# Patient Record
Sex: Male | Born: 1935 | Race: White | Hispanic: No | Marital: Married | Smoking: Former smoker
Health system: Southern US, Community
[De-identification: ages and names within clinical notes are randomized; demographics above are authoritative.]

## PROBLEM LIST (undated history)

## (undated) DIAGNOSIS — I10 Essential (primary) hypertension: Secondary | ICD-10-CM

## (undated) DIAGNOSIS — N400 Enlarged prostate without lower urinary tract symptoms: Secondary | ICD-10-CM

## (undated) DIAGNOSIS — N184 Chronic kidney disease, stage 4 (severe): Secondary | ICD-10-CM

## (undated) DIAGNOSIS — I809 Phlebitis and thrombophlebitis of unspecified site: Secondary | ICD-10-CM

## (undated) DIAGNOSIS — I480 Paroxysmal atrial fibrillation: Secondary | ICD-10-CM

## (undated) DIAGNOSIS — M1990 Unspecified osteoarthritis, unspecified site: Secondary | ICD-10-CM

## (undated) DIAGNOSIS — Z8489 Family history of other specified conditions: Secondary | ICD-10-CM

## (undated) DIAGNOSIS — C801 Malignant (primary) neoplasm, unspecified: Secondary | ICD-10-CM

## (undated) HISTORY — DX: Chronic kidney disease, stage 4 (severe): N18.4

## (undated) HISTORY — PX: BACK SURGERY: SHX140

## (undated) HISTORY — PX: HERNIA REPAIR: SHX51

## (undated) HISTORY — PX: OTHER SURGICAL HISTORY: SHX169

## (undated) HISTORY — DX: Essential (primary) hypertension: I10

## (undated) HISTORY — PX: EYE SURGERY: SHX253

## (undated) HISTORY — DX: Paroxysmal atrial fibrillation: I48.0

## (undated) HISTORY — DX: Phlebitis and thrombophlebitis of unspecified site: I80.9

---

## 2002-09-06 ENCOUNTER — Encounter: Payer: Self-pay | Admitting: Neurological Surgery

## 2002-09-10 ENCOUNTER — Encounter: Payer: Self-pay | Admitting: Neurological Surgery

## 2002-09-10 ENCOUNTER — Inpatient Hospital Stay (HOSPITAL_COMMUNITY): Admission: RE | Admit: 2002-09-10 | Discharge: 2002-09-11 | Payer: Self-pay | Admitting: Neurological Surgery

## 2002-11-22 ENCOUNTER — Ambulatory Visit (HOSPITAL_COMMUNITY): Admission: RE | Admit: 2002-11-22 | Discharge: 2002-11-22 | Payer: Self-pay | Admitting: Neurological Surgery

## 2002-12-24 ENCOUNTER — Ambulatory Visit (HOSPITAL_COMMUNITY): Admission: RE | Admit: 2002-12-24 | Discharge: 2002-12-24 | Payer: Self-pay | Admitting: Neurological Surgery

## 2013-09-05 ENCOUNTER — Other Ambulatory Visit: Payer: Self-pay | Admitting: Internal Medicine

## 2013-09-05 DIAGNOSIS — M545 Low back pain: Secondary | ICD-10-CM

## 2013-09-15 ENCOUNTER — Other Ambulatory Visit: Payer: Self-pay

## 2016-02-09 ENCOUNTER — Other Ambulatory Visit: Payer: Self-pay | Admitting: Neurological Surgery

## 2016-02-17 ENCOUNTER — Other Ambulatory Visit: Payer: Self-pay | Admitting: Neurological Surgery

## 2016-02-19 ENCOUNTER — Encounter (HOSPITAL_COMMUNITY)
Admission: RE | Admit: 2016-02-19 | Discharge: 2016-02-19 | Disposition: A | Discharge status: Home / Self Care | Payer: Medicare Other | Source: Ambulatory Visit | Attending: Neurological Surgery | Admitting: Neurological Surgery

## 2016-02-19 ENCOUNTER — Encounter (HOSPITAL_COMMUNITY): Payer: Self-pay

## 2016-02-19 DIAGNOSIS — Z01812 Encounter for preprocedural laboratory examination: Secondary | ICD-10-CM | POA: Insufficient documentation

## 2016-02-19 DIAGNOSIS — Z79899 Other long term (current) drug therapy: Secondary | ICD-10-CM | POA: Diagnosis not present

## 2016-02-19 DIAGNOSIS — I452 Bifascicular block: Secondary | ICD-10-CM | POA: Insufficient documentation

## 2016-02-19 DIAGNOSIS — I10 Essential (primary) hypertension: Secondary | ICD-10-CM | POA: Insufficient documentation

## 2016-02-19 DIAGNOSIS — Z7982 Long term (current) use of aspirin: Secondary | ICD-10-CM | POA: Diagnosis not present

## 2016-02-19 DIAGNOSIS — M4806 Spinal stenosis, lumbar region: Secondary | ICD-10-CM | POA: Diagnosis not present

## 2016-02-19 DIAGNOSIS — R9431 Abnormal electrocardiogram [ECG] [EKG]: Secondary | ICD-10-CM | POA: Diagnosis not present

## 2016-02-19 DIAGNOSIS — Z87891 Personal history of nicotine dependence: Secondary | ICD-10-CM | POA: Insufficient documentation

## 2016-02-19 DIAGNOSIS — Z01818 Encounter for other preprocedural examination: Secondary | ICD-10-CM | POA: Insufficient documentation

## 2016-02-19 HISTORY — DX: Unspecified osteoarthritis, unspecified site: M19.90

## 2016-02-19 HISTORY — DX: Essential (primary) hypertension: I10

## 2016-02-19 HISTORY — DX: Benign prostatic hyperplasia without lower urinary tract symptoms: N40.0

## 2016-02-19 LAB — CBC
HEMATOCRIT: 41.3 % (ref 39.0–52.0)
Hemoglobin: 13.3 g/dL (ref 13.0–17.0)
MCH: 29.6 pg (ref 26.0–34.0)
MCHC: 32.2 g/dL (ref 30.0–36.0)
MCV: 91.8 fL (ref 78.0–100.0)
Platelets: 217 10*3/uL (ref 150–400)
RBC: 4.5 MIL/uL (ref 4.22–5.81)
RDW: 13.9 % (ref 11.5–15.5)
WBC: 7.8 10*3/uL (ref 4.0–10.5)

## 2016-02-19 LAB — SURGICAL PCR SCREEN
MRSA, PCR: NEGATIVE
Staphylococcus aureus: NEGATIVE

## 2016-02-19 LAB — BASIC METABOLIC PANEL
Anion gap: 8 (ref 5–15)
BUN: 31 mg/dL — ABNORMAL HIGH (ref 6–20)
CALCIUM: 9.1 mg/dL (ref 8.9–10.3)
CO2: 25 mmol/L (ref 22–32)
CREATININE: 1.56 mg/dL — AB (ref 0.61–1.24)
Chloride: 105 mmol/L (ref 101–111)
GFR calc Af Amer: 42 mL/min — ABNORMAL LOW (ref >60)
GFR calc non Af Amer: 37 mL/min — ABNORMAL LOW (ref >60)
GLUCOSE: 131 mg/dL — AB (ref 65–99)
Potassium: 4.8 mmol/L (ref 3.5–5.1)
Sodium: 138 mmol/L (ref 135–145)

## 2016-02-19 NOTE — Pre-Procedure Instructions (Signed)
    [facility] [place]  02/19/2016      [facility] - [place], [facility] [address] [place] [address] Phone: [phone] Fax: [phone]    Your procedure is scheduled on 02/27/16.  Report to [facility] Admitting at 830 A.M.  Call this number if you have problems the morning of surgery:  [phone]   Remember:  Do not eat food or drink liquids after midnight.  Take these medicines the morning of surgery with A SIP OF WATER --norvasc,ultram   Do not wear jewelry, make-up or nail polish.  Do not wear lotions, powders, or perfumes.  You may wear deodorant.  Do not shave 48 hours prior to surgery.  Men may shave face and neck.  Do not bring valuables to the hospital.  [facility] is not responsible for any belongings or valuables.  Contacts, dentures or bridgework may not be worn into surgery.  Leave your suitcase in the car.  After surgery it may be brought to your room.  For patients admitted to the hospital, discharge time will be determined by your treatment team.  Patients discharged the day of surgery will not be allowed to drive home.   Name and phone number of your driver:   Special instructions:   Please read over the following fact sheets that you were given. MRSA Information

## 2016-02-19 NOTE — Progress Notes (Signed)
I will contact Dr Manuella Ghazi in [place] for any cardiac studies. Pt states he has not had any ekgs,etc in years.

## 2016-02-20 NOTE — Progress Notes (Addendum)
Anesthesia Chart Review:  Pt is a 82 year old male scheduled for L2-3, L3-4, L4-5 lumbar laminectomy/ decompression microdiscectomy on 02/27/2016 with Dr. Ellene Route.   PCP is Dr. Manuella Ghazi at [facility].   PMH includes:  HTN. Former smoker. BMI 26  Medications include: amlodipine, ASA, lisinopril  Preoperative labs reviewed.  Cr 1.56, BUN 31  EKG 02/19/16: Atrial fibrillation (80 bpm) with premature ventricular or aberrantly conducted complexes. RBBB. LAFB. Bifascicular block   There is a prior EKG in our system from 2003 that shows NSR. I checked with Dr. Trena Platt office and pt doe not have a dx of afib in his history and EKG from 2010 also shows NSR.   Pt will need cardiac evaluation prior to surgery for new onset afib. Left voicemail for Janett Billow in Dr. Clarice Pole office.   Willeen Cass, FNP-BC [facility]/Anesthesiology Phone: [phone] 02/20/2016 1:59 PM  Addendum:   Echo 02/25/16: - Left ventricle: The cavity size was normal. Wall thickness was increased in a pattern of mild LVH. There was moderate focal basal hypertrophy of the septum. Systolic function was normal. The estimated ejection fraction was in the range of 55% to 60%. Wall motion was normal; there were no regional wall motion abnormalities. Features are consistent with a pseudonormal left ventricular filling pattern, with concomitant abnormal relaxation and increased filling pressure (grade 2 diastolic dysfunction). - Aortic valve: Mildly calcified annulus. Trileaflet. - Mitral valve: Calcified annulus. Mildly thickened leaflets. There was mild regurgitation. - Left atrium: The atrium was mildly dilated. - Right atrium: Central venous pressure (est): 3 mm Hg. - Atrial septum: No defect or patent foramen ovale was identified. - Tricuspid valve: There was trivial regurgitation. - Pulmonary arteries: PA peak pressure: 19 mm Hg (S). - Pericardium, extracardiac: There was no pericardial effusion. -  Impressions: Mild LVH with moderate basal septal hypertrophy and LVEF 55-60%. Grade 2 diastolic dysfunction with increased LV filling pressure. Mild left atrial enlargement. MAC with mildly thickened mitral leaflets and mild mitral regurgitation. Mildly calcified aortic annulus. Trivial tricuspid regurgitation with normal estimated PASP.  Pt saw Dr. Shelva Majestic with cardiology for new onset afib 02/25/16 and had an echo that same day. Metoprolol was started. By notes, EKG 02/25/16 showed NSR, occasional PVC and 1st degree AV block.  Dr. Claiborne Billings cleared pt for surgery noting "postoperative telemetry and an ECG should be obtained following his surgical procedure".   If no changes, I anticipate pt can proceed with surgery as scheduled.   Willeen Cass, FNP-BC [facility]/Anesthesiology Phone: [phone] 02/26/2016 1:05 PM

## 2016-02-23 NOTE — Progress Notes (Signed)
Spoke with Janett Billow at Dr Ellene Route.  Pt has cardiac clearance appointment with Dr Claiborne Billings on Wednesday 5/24

## 2016-02-25 ENCOUNTER — Ambulatory Visit (INDEPENDENT_AMBULATORY_CARE_PROVIDER_SITE_OTHER): Payer: Medicare Other | Admitting: Cardiovascular Disease

## 2016-02-25 ENCOUNTER — Ambulatory Visit (HOSPITAL_BASED_OUTPATIENT_CLINIC_OR_DEPARTMENT_OTHER)
Admission: RE | Admit: 2016-02-25 | Discharge: 2016-02-25 | Disposition: A | Discharge status: Home / Self Care | Payer: Medicare Other | Source: Ambulatory Visit | Attending: Cardiovascular Disease | Admitting: Cardiovascular Disease

## 2016-02-25 ENCOUNTER — Encounter: Payer: Self-pay | Admitting: Cardiovascular Disease

## 2016-02-25 VITALS — BP 130/68 | HR 75 | Ht 68.0 in | Wt 168.0 lb

## 2016-02-25 DIAGNOSIS — I10 Essential (primary) hypertension: Secondary | ICD-10-CM | POA: Diagnosis not present

## 2016-02-25 DIAGNOSIS — Z01810 Encounter for preprocedural cardiovascular examination: Secondary | ICD-10-CM | POA: Diagnosis not present

## 2016-02-25 DIAGNOSIS — Z01818 Encounter for other preprocedural examination: Secondary | ICD-10-CM | POA: Insufficient documentation

## 2016-02-25 DIAGNOSIS — I071 Rheumatic tricuspid insufficiency: Secondary | ICD-10-CM | POA: Insufficient documentation

## 2016-02-25 DIAGNOSIS — I440 Atrioventricular block, first degree: Secondary | ICD-10-CM

## 2016-02-25 DIAGNOSIS — I517 Cardiomegaly: Secondary | ICD-10-CM | POA: Insufficient documentation

## 2016-02-25 DIAGNOSIS — I480 Paroxysmal atrial fibrillation: Secondary | ICD-10-CM | POA: Diagnosis not present

## 2016-02-25 DIAGNOSIS — I340 Nonrheumatic mitral (valve) insufficiency: Secondary | ICD-10-CM

## 2016-02-25 DIAGNOSIS — I493 Ventricular premature depolarization: Secondary | ICD-10-CM

## 2016-02-25 LAB — ECHOCARDIOGRAM COMPLETE
HEIGHTINCHES: 68 in
Weight: 2688 oz

## 2016-02-25 MED ORDER — METOPROLOL TARTRATE 25 MG PO TABS: 12.5000 mg | ORAL_TABLET | Freq: Two times a day (BID) | ORAL | Status: DC

## 2016-02-25 NOTE — Progress Notes (Signed)
Patient ID: Nathan Kim, male   DOB: 24-Feb-1920, 82 y.o.   MRN: [id number]     Primary Nathan Kim: Dr. [name] Referring M.D.: Dr. Kristeen Miss  PATIENT PROFILE: Nathan Kim is a 82 y.o. male who is scheduled to undergo lumbar laminectomy on 02/27/2016.  He presents for preoperative evaluation and clearance after he was found to have atrial fibrillation on his preoperative ECG.   HPI:  Nathan Kim is a healthy 82 year old gentleman who does not have any surgical history and only over the last several years, was started on medication for high blood pressure.  He now is on lisinopril 20 mg and amlodipine 2.5 mg daily.  He also has been on aspirin 81 mg.  He has developed progressive lower back discomfort and has undergone evaluation by Dr. Ellene Route.  He is had recurrent pain despite epidural steroid injections.  He is scheduled to undergo L2-L3, L3-L4, and L4-L5 laminectomy in 2 days.  On 02/19/2016 he underwent preoperative screening.  An ECG done on that day showed atrial fibrillation with a PVC and left anterior hemiblock.  The patient was entirely asymptomatic and was unaware of any rhythm disturbance.  He denies any recent shortness of breath or chest pain. He presents to the office today for cardiology evaluation. He is here with his daughter.  Past Medical History  Diagnosis Date  . Hypertension   . Arthritis   . BPH (benign prostatic hyperplasia)     Past Surgical History  Procedure Laterality Date  . Carpel tunnel    . Eye surgery    . Back surgery    . Hernia repair      No Known Allergies  Current Outpatient Prescriptions  Medication Sig Dispense Refill  . amLODipine (NORVASC) 2.5 MG tablet Take 1 tablet by mouth daily.  4  . aspirin 81 MG tablet Take 81 mg by mouth daily.    Marland Kitchen lisinopril (PRINIVIL,ZESTRIL) 20 MG tablet Take 1 tablet by mouth daily.  3  . traMADol (ULTRAM) 50 MG tablet Take 1 tablet by mouth every 6 (six) hours as needed.  2  . metoprolol tartrate  (LOPRESSOR) 25 MG tablet Take 0.5 tablets (12.5 mg total) by mouth 2 (two) times daily. 60 tablet 3   No current facility-administered medications for this visit.    Social History   Social History  . Marital Status: Married    Spouse Name: N/A  . Number of Children: N/A  . Years of Education: N/A   Occupational History  . Not on file.   Social History Main Topics  . Smoking status: Former Research scientist (life sciences)  . Smokeless tobacco: Not on file  . Alcohol Use: No  . Drug Use: No  . Sexual Activity: Not on file   Other Topics Concern  . Not on file   Social History Narrative   Additional social history is notable that he is married for 65 years.  He lives with his wife.  He has 3 children, 4 grandchildren 3 great-grandchildren.  He is retired and previously had worked at [employer] in [place].  He completed 12 grade of education.  He is a previous smoker and smoked for ~ 50 years.  He quit 30 years ago.  He does exercise regularly at least 3 days per week.  This has been limited by his back discomfort.  Family history is notable in that his mother died at age 6 and his father died at age 57.  One brother died at age 76 with cancer.  Another brother is living.  One sister died at 19 with a stroke.  2 sisters are living.  One child died with cancer.  ROS General: Negative; No fevers, chills, or night sweats HEENT: Negative; No changes in vision or hearing, sinus congestion, difficulty swallowing Pulmonary: Negative; No cough, wheezing, shortness of breath, hemoptysis Cardiovascular:  See HPI;  GI: Negative; No nausea, vomiting, diarrhea, or abdominal pain GU: Negative; No dysuria, hematuria, or difficulty voiding Musculoskeletal: Positive for significant lumbar back pain Hematologic/Oncologic: Negative; no easy bruising, bleeding Endocrine: Negative; no heat/cold intolerance; no diabetes Neuro: Negative; no changes in balance, headaches Skin: Negative; No rashes or skin  lesions Psychiatric: Negative; No behavioral problems, depression Sleep: Negative; No daytime sleepiness, hypersomnolence, bruxism, restless legs, hypnogagnic hallucinations Other comprehensive 14 point system review is negative   Physical Exam BP 130/68 mmHg  Pulse 75  Ht 5\' 8"  (1.727 m)  Wt 168 lb (76.204 kg)  BMI 25.55 kg/m2  Wt Readings from Last 3 Encounters:  02/25/16 168 lb (76.204 kg)  02/19/16 169 lb 1.5 oz (76.7 kg)   General: Alert, oriented, no distress. A young appearing 82 year old. Skin: normal turgor, no rashes, warm and dry HEENT: Normocephalic, atraumatic. Pupils equal round and reactive to light; sclera anicteric; extraocular muscles intact; Fundi without hemorrhages or exudates.  Disks flat Nose without nasal septal hypertrophy Mouth/Parynx benign; Mallinpatti scale Neck: No JVD, no carotid bruits; normal carotid upstroke Lungs: clear to ausculatation and percussion; no wheezing or rales Chest wall: without tenderness to palpitation Heart: PMI not displaced, RRR with an occasional ectopic complex, s1 s2 normal, 1/6 systolic murmur, no diastolic murmur, no rubs, gallops, thrills, or heaves Abdomen: soft, nontender; no hepatosplenomehaly, BS+; abdominal aorta nontender and not dilated by palpation. Back: no CVA tenderness Pulses 2+ Musculoskeletal: full range of motion, normal strength, no joint deformities Extremities: no clubbing cyanosis or edema, Homan's sign negative  Neurologic: grossly nonfocal; Cranial nerves grossly wnl Psychologic: Normal mood and affect   ECG (independently read by me): Normal sinus rhythm at 75 bpm with sinus arrhythmia.  An occasional unifocal PVC with VA conduction.  First-degree AV block with a PR interval of 212 ms,  LABS:  BMP Latest Ref Rng 02/19/2016  Glucose 65 - 99 mg/dL 131(H)  BUN 6 - 20 mg/dL 31(H)  Creatinine 0.61 - 1.24 mg/dL 1.56(H)  Sodium 135 - 145 mmol/L 138  Potassium 3.5 - 5.1 mmol/L 4.8  Chloride 101 - 111  mmol/L 105  CO2 22 - 32 mmol/L 25  Calcium 8.9 - 10.3 mg/dL 9.1     No flowsheet data found.  CBC Latest Ref Rng 02/19/2016  WBC 4.0 - 10.5 K/uL 7.8  Hemoglobin 13.0 - 17.0 g/dL 13.3  Hematocrit 39.0 - 52.0 % 41.3  Platelets 150 - 400 K/uL 217   Lab Results  Component Value Date   MCV 91.8 02/19/2016   No results found for: TSH No results found for: HGBA1C   BNP No results found for: BNP  ProBNP No results found for: PROBNP   Lipid Panel  No results found for: CHOL, TRIG, HDL, CHOLHDL, VLDL, LDLCALC, LDLDIRECT  RADIOLOGY: No results found.  I scheduled the patient for an echo to be done as soon as possible today following his office visit: ------------------------------------------------------------------- 02/25/2016 ECHO Study Conclusions  - Left ventricle: The cavity size was normal. Wall thickness was  increased in a pattern of mild LVH. There was moderate focal  basal hypertrophy of the septum. Systolic function was normal.  The estimated ejection fraction was in the range of 55% to 60%.  Wall motion was normal; there were no regional wall motion  abnormalities. Features are consistent with a pseudonormal left  ventricular filling pattern, with concomitant abnormal relaxation  and increased filling pressure (grade 2 diastolic dysfunction). - Aortic valve: Mildly calcified annulus. Trileaflet. - Mitral valve: Calcified annulus. Mildly thickened leaflets .  There was mild regurgitation. - Left atrium: The atrium was mildly dilated. - Right atrium: Central venous pressure (est): 3 mm Hg. - Atrial septum: No defect or patent foramen ovale was identified. - Tricuspid valve: There was trivial regurgitation. - Pulmonary arteries: PA peak pressure: 19 mm Hg (S). - Pericardium, extracardiac: There was no pericardial effusion.  Impressions:  - Mild LVH with moderate basal septal hypertrophy and LVEF 55-60%.  Grade 2 diastolic dysfunction with increased  LV filling pressure.  Mild left atrial enlargement. MAC with mildly thickened mitral  leaflets and mild mitral regurgitation. Mildly calcified aortic  annulus. Trivial tricuspid regurgitation with normal estimated  PASP.   ASSESSMENT AND PLAN: Mr. Damondre Childree is a young appearing 82 year old male who is been amazingly healthy for most of his life and has never undergone any surgical procedure.  He has several year history of hypertension, and most recently has been on lisinopril 20 mg daily in addition to low-dose amlodipine at 2.5 mg.  He is unaware of any arrhythmia but was recently found to be in atrial fibrillation on his preoperative ECG from 02/19/2016.  His ECG today in the office shows sinus rhythm with mild sinus arrhythmia and an occasional PVC.  I scheduled him for a stat echo which was done today after his office visit.  As described above, this confirms normal systolic function with an EF of 55-60% without wall motion abnormalities.  There was mitral annular calcification and a mildly calcified aortic valve annulus.  He had normal pulmonary pressures.  I have started him on metoprolol, tartrate 12.5 mg twice a day, which should be helpful to prevent recurrent atrial fibrillation episodes.  With his echo showing normal LV function, wall motion abnormalities and the fact that he is back in sinus rhythm today  I will give him clearance for his planned laminectomy procedure in 2 days.  The patient will have taken 4 doses of metoprolol at low-dose.  Prior to Friday morning which should hopefully allow stability of his cardiac rhythm.  Ultimately, he may be a candidate for switching his amlodipine to Cardizem.  We will need to decide potential long-term anticoagulation versus aspirin therapy, but with his upcoming surgery this will not be instituted presently.  Postoperative telemetry and an ECG should be obtained following his surgical procedure.  I will see him in the office in 3-4 weeks  for follow-up evaluation or sooner if problems arise.   Nathan Sine, Nathan Kim, [facility] 02/25/2016 9:57 PM

## 2016-02-25 NOTE — Patient Instructions (Signed)
Your physician has requested that you have an echocardiogram. Echocardiography is a painless test that uses sound waves to create images of your heart. It provides your doctor with information about the size and shape of your heart and how well your heart's chambers and valves are working. This procedure takes approximately one hour. There are no restrictions for this procedure. This will be done today.  Your physician has recommended you make the following change in your medication: start new prescription for lopressor.  Your physician recommends that you schedule a follow-up appointment in: PENDING ECHO RESULTS.

## 2016-02-26 MED ORDER — CEFAZOLIN SODIUM-DEXTROSE 2-4 GM/100ML-% IV SOLN
2.0000 g | INTRAVENOUS | Status: AC
  Administered 2016-02-27: 2 g via INTRAVENOUS
  Filled 2016-02-26: qty 100

## 2016-02-27 ENCOUNTER — Inpatient Hospital Stay (HOSPITAL_COMMUNITY): Payer: Medicare Other | Admitting: Anesthesiology

## 2016-02-27 ENCOUNTER — Encounter (HOSPITAL_COMMUNITY)
Admission: RE | Disposition: A | Discharge status: Home Health | Payer: Self-pay | Source: Ambulatory Visit | Attending: Neurological Surgery

## 2016-02-27 ENCOUNTER — Inpatient Hospital Stay (HOSPITAL_COMMUNITY)
Admission: RE | Admit: 2016-02-27 | Discharge: 2016-02-28 | DRG: 517 | Disposition: A | Discharge status: Home Health | Payer: Medicare Other | Source: Ambulatory Visit | Attending: Neurological Surgery | Admitting: Neurological Surgery

## 2016-02-27 ENCOUNTER — Inpatient Hospital Stay (HOSPITAL_COMMUNITY): Payer: Medicare Other | Admitting: Emergency Medicine

## 2016-02-27 ENCOUNTER — Inpatient Hospital Stay (HOSPITAL_COMMUNITY): Payer: Medicare Other

## 2016-02-27 ENCOUNTER — Encounter (HOSPITAL_COMMUNITY): Payer: Self-pay | Admitting: *Deleted

## 2016-02-27 DIAGNOSIS — Z419 Encounter for procedure for purposes other than remedying health state, unspecified: Secondary | ICD-10-CM

## 2016-02-27 DIAGNOSIS — R338 Other retention of urine: Secondary | ICD-10-CM | POA: Diagnosis present

## 2016-02-27 DIAGNOSIS — M4806 Spinal stenosis, lumbar region: Secondary | ICD-10-CM | POA: Diagnosis present

## 2016-02-27 DIAGNOSIS — M4726 Other spondylosis with radiculopathy, lumbar region: Secondary | ICD-10-CM | POA: Diagnosis present

## 2016-02-27 DIAGNOSIS — N401 Enlarged prostate with lower urinary tract symptoms: Secondary | ICD-10-CM | POA: Diagnosis present

## 2016-02-27 DIAGNOSIS — M545 Low back pain: Secondary | ICD-10-CM | POA: Diagnosis present

## 2016-02-27 DIAGNOSIS — I10 Essential (primary) hypertension: Secondary | ICD-10-CM | POA: Diagnosis present

## 2016-02-27 DIAGNOSIS — Z87891 Personal history of nicotine dependence: Secondary | ICD-10-CM | POA: Diagnosis not present

## 2016-02-27 DIAGNOSIS — Z79899 Other long term (current) drug therapy: Secondary | ICD-10-CM | POA: Diagnosis not present

## 2016-02-27 DIAGNOSIS — Z7982 Long term (current) use of aspirin: Secondary | ICD-10-CM | POA: Diagnosis not present

## 2016-02-27 DIAGNOSIS — M48062 Spinal stenosis, lumbar region with neurogenic claudication: Secondary | ICD-10-CM | POA: Diagnosis present

## 2016-02-27 HISTORY — PX: LUMBAR LAMINECTOMY/DECOMPRESSION MICRODISCECTOMY: SHX5026

## 2016-02-27 SURGERY — LUMBAR LAMINECTOMY/DECOMPRESSION MICRODISCECTOMY 3 LEVELS
Anesthesia: General | Site: Back

## 2016-02-27 MED ORDER — NEOSTIGMINE METHYLSULFATE 10 MG/10ML IV SOLN
INTRAVENOUS | Status: DC | PRN
  Administered 2016-02-27: 3 mg via INTRAVENOUS

## 2016-02-27 MED ORDER — SENNA 8.6 MG PO TABS
1.0000 | ORAL_TABLET | Freq: Two times a day (BID) | ORAL | Status: DC
  Administered 2016-02-27 – 2016-02-28 (×2): 8.6 mg via ORAL
  Filled 2016-02-27 (×2): qty 1

## 2016-02-27 MED ORDER — GLYCOPYRROLATE 0.2 MG/ML IJ SOLN
INTRAMUSCULAR | Status: DC | PRN
  Administered 2016-02-27: 0.4 mg via INTRAVENOUS

## 2016-02-27 MED ORDER — SODIUM CHLORIDE 0.9 % IV SOLN
INTRAVENOUS | Status: DC
  Administered 2016-02-27 – 2016-02-28 (×2): via INTRAVENOUS

## 2016-02-27 MED ORDER — 0.9 % SODIUM CHLORIDE (POUR BTL) OPTIME
TOPICAL | Status: DC | PRN
  Administered 2016-02-27: 1000 mL

## 2016-02-27 MED ORDER — MENTHOL 3 MG MT LOZG: 1.0000 | LOZENGE | OROMUCOSAL | Status: DC | PRN

## 2016-02-27 MED ORDER — DEXAMETHASONE SODIUM PHOSPHATE 10 MG/ML IJ SOLN
INTRAMUSCULAR | Status: DC | PRN
  Administered 2016-02-27: 10 mg via INTRAVENOUS

## 2016-02-27 MED ORDER — THROMBIN 5000 UNITS EX SOLR
CUTANEOUS | Status: DC | PRN
  Administered 2016-02-27 (×2): 5000 [IU] via TOPICAL

## 2016-02-27 MED ORDER — MORPHINE SULFATE (PF) 2 MG/ML IV SOLN: 1.0000 mg | INTRAVENOUS | Status: DC | PRN

## 2016-02-27 MED ORDER — LACTATED RINGERS IV SOLN
INTRAVENOUS | Status: DC
  Administered 2016-02-27 (×3): via INTRAVENOUS

## 2016-02-27 MED ORDER — SODIUM CHLORIDE 0.9% FLUSH: 3.0000 mL | INTRAVENOUS | Status: DC | PRN

## 2016-02-27 MED ORDER — HYDROCODONE-ACETAMINOPHEN 5-325 MG PO TABS
1.0000 | ORAL_TABLET | ORAL | Status: DC | PRN
  Administered 2016-02-28: 1 via ORAL
  Filled 2016-02-27: qty 1

## 2016-02-27 MED ORDER — DEXTROSE 5 % IV SOLN
500.0000 mg | Freq: Four times a day (QID) | INTRAVENOUS | Status: DC | PRN
  Filled 2016-02-27: qty 5

## 2016-02-27 MED ORDER — THROMBIN 5000 UNITS EX SOLR
OROMUCOSAL | Status: DC | PRN
  Administered 2016-02-27: 14:00:00 via TOPICAL

## 2016-02-27 MED ORDER — FENTANYL CITRATE (PF) 100 MCG/2ML IJ SOLN
INTRAMUSCULAR | Status: DC | PRN
  Administered 2016-02-27 (×2): 50 ug via INTRAVENOUS

## 2016-02-27 MED ORDER — ALUM & MAG HYDROXIDE-SIMETH 200-200-20 MG/5ML PO SUSP: 30.0000 mL | Freq: Four times a day (QID) | ORAL | Status: DC | PRN

## 2016-02-27 MED ORDER — AMLODIPINE BESYLATE 2.5 MG PO TABS
2.5000 mg | ORAL_TABLET | Freq: Every day | ORAL | Status: DC
  Filled 2016-02-27: qty 1

## 2016-02-27 MED ORDER — PROPOFOL 10 MG/ML IV BOLUS
INTRAVENOUS | Status: DC | PRN
  Administered 2016-02-27: 100 mg via INTRAVENOUS

## 2016-02-27 MED ORDER — LIDOCAINE HCL (CARDIAC) 20 MG/ML IV SOLN
INTRAVENOUS | Status: DC | PRN
  Administered 2016-02-27: 40 mg via INTRAVENOUS

## 2016-02-27 MED ORDER — DOCUSATE SODIUM 100 MG PO CAPS
100.0000 mg | ORAL_CAPSULE | Freq: Two times a day (BID) | ORAL | Status: DC
  Administered 2016-02-27 – 2016-02-28 (×2): 100 mg via ORAL
  Filled 2016-02-27 (×2): qty 1

## 2016-02-27 MED ORDER — BUPIVACAINE HCL (PF) 0.5 % IJ SOLN
INTRAMUSCULAR | Status: DC | PRN
  Administered 2016-02-27: 20 mL
  Administered 2016-02-27: 5 mL

## 2016-02-27 MED ORDER — PHENYLEPHRINE HCL 10 MG/ML IJ SOLN
10.0000 mg | INTRAMUSCULAR | Status: DC | PRN
  Administered 2016-02-27: 30 ug/min via INTRAVENOUS

## 2016-02-27 MED ORDER — POLYETHYLENE GLYCOL 3350 17 G PO PACK: 17.0000 g | PACK | Freq: Every day | ORAL | Status: DC | PRN

## 2016-02-27 MED ORDER — ACETAMINOPHEN 325 MG PO TABS: 650.0000 mg | ORAL_TABLET | ORAL | Status: DC | PRN

## 2016-02-27 MED ORDER — SODIUM CHLORIDE 0.9% FLUSH: 3.0000 mL | Freq: Two times a day (BID) | INTRAVENOUS | Status: DC

## 2016-02-27 MED ORDER — PROPOFOL 10 MG/ML IV BOLUS
INTRAVENOUS | Status: AC
  Filled 2016-02-27: qty 20

## 2016-02-27 MED ORDER — FENTANYL CITRATE (PF) 250 MCG/5ML IJ SOLN
INTRAMUSCULAR | Status: AC
  Filled 2016-02-27: qty 5

## 2016-02-27 MED ORDER — ONDANSETRON HCL 4 MG/2ML IJ SOLN
INTRAMUSCULAR | Status: DC | PRN
  Administered 2016-02-27: 4 mg via INTRAVENOUS

## 2016-02-27 MED ORDER — LIDOCAINE-EPINEPHRINE 1 %-1:100000 IJ SOLN
INTRAMUSCULAR | Status: DC | PRN
  Administered 2016-02-27: 5 mL

## 2016-02-27 MED ORDER — TRAMADOL HCL 50 MG PO TABS: 50.0000 mg | ORAL_TABLET | Freq: Four times a day (QID) | ORAL | Status: DC | PRN

## 2016-02-27 MED ORDER — METOPROLOL TARTRATE 25 MG PO TABS
12.5000 mg | ORAL_TABLET | Freq: Two times a day (BID) | ORAL | Status: DC
  Administered 2016-02-27 – 2016-02-28 (×2): 12.5 mg via ORAL
  Filled 2016-02-27 (×2): qty 1

## 2016-02-27 MED ORDER — HEMOSTATIC AGENTS (NO CHARGE) OPTIME
TOPICAL | Status: DC | PRN
  Administered 2016-02-27: 1 via TOPICAL

## 2016-02-27 MED ORDER — ONDANSETRON HCL 4 MG/2ML IJ SOLN
4.0000 mg | INTRAMUSCULAR | Status: DC | PRN
  Administered 2016-02-27 – 2016-02-28 (×4): 4 mg via INTRAVENOUS
  Filled 2016-02-27 (×4): qty 2

## 2016-02-27 MED ORDER — BACITRACIN 50000 UNITS IM SOLR
INTRAMUSCULAR | Status: DC | PRN
  Administered 2016-02-27: 14:00:00

## 2016-02-27 MED ORDER — BISACODYL 10 MG RE SUPP: 10.0000 mg | Freq: Every day | RECTAL | Status: DC | PRN

## 2016-02-27 MED ORDER — METHOCARBAMOL 500 MG PO TABS: 500.0000 mg | ORAL_TABLET | Freq: Four times a day (QID) | ORAL | Status: DC | PRN

## 2016-02-27 MED ORDER — DEXAMETHASONE 2 MG PO TABS
2.0000 mg | ORAL_TABLET | Freq: Two times a day (BID) | ORAL | Status: DC
  Administered 2016-02-27 – 2016-02-28 (×2): 2 mg via ORAL
  Filled 2016-02-27 (×2): qty 1

## 2016-02-27 MED ORDER — ACETAMINOPHEN 650 MG RE SUPP: 650.0000 mg | RECTAL | Status: DC | PRN

## 2016-02-27 MED ORDER — LISINOPRIL 20 MG PO TABS
20.0000 mg | ORAL_TABLET | Freq: Every day | ORAL | Status: DC
  Filled 2016-02-27: qty 1

## 2016-02-27 MED ORDER — ROCURONIUM BROMIDE 100 MG/10ML IV SOLN
INTRAVENOUS | Status: DC | PRN
  Administered 2016-02-27: 50 mg via INTRAVENOUS

## 2016-02-27 MED ORDER — FLEET ENEMA 7-19 GM/118ML RE ENEM: 1.0000 | ENEMA | Freq: Once | RECTAL | Status: DC | PRN

## 2016-02-27 MED ORDER — EPHEDRINE SULFATE 50 MG/ML IJ SOLN
INTRAMUSCULAR | Status: DC | PRN
  Administered 2016-02-27 (×2): 10 mg via INTRAVENOUS

## 2016-02-27 MED ORDER — PHENOL 1.4 % MT LIQD: 1.0000 | OROMUCOSAL | Status: DC | PRN

## 2016-02-27 MED ORDER — SODIUM CHLORIDE 0.9 % IV SOLN: 250.0000 mL | INTRAVENOUS | Status: DC

## 2016-02-27 SURGICAL SUPPLY — 51 items
ADH SKN CLS APL DERMABOND .7 (GAUZE/BANDAGES/DRESSINGS) ×1
BAG DECANTER FOR FLEXI CONT (MISCELLANEOUS) ×3 IMPLANT
BLADE CLIPPER SURG (BLADE) IMPLANT
BUR ACORN 6.0 (BURR) IMPLANT
BUR ACORN 6.0MM (BURR)
BUR MATCHSTICK NEURO 3.0 LAGG (BURR) ×3 IMPLANT
CANISTER SUCT 3000ML PPV (MISCELLANEOUS) ×3 IMPLANT
DECANTER SPIKE VIAL GLASS SM (MISCELLANEOUS) ×3 IMPLANT
DERMABOND ADVANCED (GAUZE/BANDAGES/DRESSINGS) ×2
DERMABOND ADVANCED .7 DNX12 (GAUZE/BANDAGES/DRESSINGS) ×1 IMPLANT
DEVICE DISSECT PLASMABLAD 3.0S (MISCELLANEOUS) IMPLANT
DRAPE LAPAROTOMY 100X72X124 (DRAPES) ×3 IMPLANT
DRAPE MICROSCOPE LEICA (MISCELLANEOUS) ×2 IMPLANT
DRAPE POUCH INSTRU U-SHP 10X18 (DRAPES) ×3 IMPLANT
DRAPE PROXIMA HALF (DRAPES) IMPLANT
DURAPREP 26ML APPLICATOR (WOUND CARE) ×3 IMPLANT
ELECT REM PT RETURN 9FT ADLT (ELECTROSURGICAL) ×3
ELECTRODE REM PT RTRN 9FT ADLT (ELECTROSURGICAL) ×1 IMPLANT
GAUZE SPONGE 4X4 12PLY STRL (GAUZE/BANDAGES/DRESSINGS) ×3 IMPLANT
GAUZE SPONGE 4X4 16PLY XRAY LF (GAUZE/BANDAGES/DRESSINGS) IMPLANT
GLOVE BIOGEL PI IND STRL 7.5 (GLOVE) IMPLANT
GLOVE BIOGEL PI IND STRL 8.5 (GLOVE) ×1 IMPLANT
GLOVE BIOGEL PI INDICATOR 7.5 (GLOVE) ×2
GLOVE BIOGEL PI INDICATOR 8.5 (GLOVE) ×2
GLOVE ECLIPSE 7.0 STRL STRAW (GLOVE) ×2 IMPLANT
GLOVE ECLIPSE 8.5 STRL (GLOVE) ×3 IMPLANT
GOWN STRL REUS W/ TWL LRG LVL3 (GOWN DISPOSABLE) IMPLANT
GOWN STRL REUS W/ TWL XL LVL3 (GOWN DISPOSABLE) IMPLANT
GOWN STRL REUS W/TWL 2XL LVL3 (GOWN DISPOSABLE) ×3 IMPLANT
GOWN STRL REUS W/TWL LRG LVL3 (GOWN DISPOSABLE) ×6
GOWN STRL REUS W/TWL XL LVL3 (GOWN DISPOSABLE)
HEMOSTAT POWDER SURGIFOAM 1G (HEMOSTASIS) ×2 IMPLANT
KIT BASIN OR (CUSTOM PROCEDURE TRAY) ×3 IMPLANT
KIT ROOM TURNOVER OR (KITS) ×3 IMPLANT
NDL SPNL 20GX3.5 QUINCKE YW (NEEDLE) IMPLANT
NEEDLE HYPO 22GX1.5 SAFETY (NEEDLE) ×3 IMPLANT
NEEDLE SPNL 20GX3.5 QUINCKE YW (NEEDLE) ×3 IMPLANT
NS IRRIG 1000ML POUR BTL (IV SOLUTION) ×3 IMPLANT
PACK LAMINECTOMY NEURO (CUSTOM PROCEDURE TRAY) ×3 IMPLANT
PAD ARMBOARD 7.5X6 YLW CONV (MISCELLANEOUS) ×9 IMPLANT
PATTIES SURGICAL .5 X1 (DISPOSABLE) ×5 IMPLANT
PLASMABLADE 3.0S (MISCELLANEOUS) ×3
RUBBERBAND STERILE (MISCELLANEOUS) ×4 IMPLANT
SPONGE SURGIFOAM ABS GEL SZ50 (HEMOSTASIS) ×3 IMPLANT
SUT VIC AB 1 CT1 18XBRD ANBCTR (SUTURE) ×1 IMPLANT
SUT VIC AB 1 CT1 8-18 (SUTURE) ×3
SUT VIC AB 2-0 CP2 18 (SUTURE) ×3 IMPLANT
SUT VIC AB 3-0 SH 8-18 (SUTURE) ×3 IMPLANT
TOWEL OR 17X24 6PK STRL BLUE (TOWEL DISPOSABLE) ×3 IMPLANT
TOWEL OR 17X26 10 PK STRL BLUE (TOWEL DISPOSABLE) ×3 IMPLANT
WATER STERILE IRR 1000ML POUR (IV SOLUTION) ×3 IMPLANT

## 2016-02-27 NOTE — Transfer of Care (Signed)
Immediate Anesthesia Transfer of Care Note  Patient: Nathan Kim  Procedure(s) Performed: Procedure(s) with comments: LUMBAR LAMINECTOMY/DECOMPRESSION MICRODISCECTOMY 3 LEVELS (N/A) - L2-3 L3-4 L4-5 Laminectomy  Patient Location: NICU  Anesthesia Type:General  Level of Consciousness: awake, alert  and patient cooperative  Airway & Oxygen Therapy: Patient Spontanous Breathing and Patient connected to face mask oxygen  Post-op Assessment: Post vital signs: Reviewed and stable  Last Vitals:  Filed Vitals:   02/27/16 0933 02/27/16 1545  BP: 140/71 159/95  Pulse: 62 68  Temp: 36.8 C 36.2 C  Resp: 18 22    Last Pain: There were no vitals filed for this visit.    Patients Stated Pain Goal: 3 ([id number])  Complications: No apparent anesthesia complications

## 2016-02-27 NOTE — H&P (Signed)
Nathan Kim is an 82 y.o. male.   Chief Complaint: Back pain bilateral leg pain with inability to walk but short distances HPI: The patient is an 82 year old individual whose had advanced spondylosis of lumbar spine with severe stenosis at L2-3 3445 and treated with epidural steroid injections for a number of years and these of cervical well however lately he notes that he's been having increasing fatigability of his legs generalized weakness and inability to walk even short distances he is interested in some definitive intervention and I noted that bilateral laminotomies and foraminotomies should give him relief of the worst of his stenotic symptoms and hopefully allow him to walk a bit more comfortably. He is now admitted for the surgery  Past Medical History  Diagnosis Date  . Hypertension   . Arthritis   . BPH (benign prostatic hyperplasia)     Past Surgical History  Procedure Laterality Date  . Carpel tunnel    . Eye surgery    . Back surgery    . Hernia repair      History reviewed. No pertinent family history. Social History:  reports that he has quit smoking. He does not have any smokeless tobacco history on file. He reports that he does not drink alcohol or use illicit drugs.  Allergies: No Known Allergies  Medications Prior to Admission  Medication Sig Dispense Refill  . amLODipine (NORVASC) 2.5 MG tablet Take 1 tablet by mouth daily.  4  . aspirin 81 MG tablet Take 81 mg by mouth daily.    Marland Kitchen lisinopril (PRINIVIL,ZESTRIL) 20 MG tablet Take 1 tablet by mouth daily.  3  . metoprolol tartrate (LOPRESSOR) 25 MG tablet Take 0.5 tablets (12.5 mg total) by mouth 2 (two) times daily. 60 tablet 3  . traMADol (ULTRAM) 50 MG tablet Take 1 tablet by mouth every 6 (six) hours as needed.  2    No results found for this or any previous visit (from the past 48 hour(s)). No results found.  Review of Systems  HENT: Negative.   Eyes: Negative.   Respiratory: Negative.    Cardiovascular: Negative.   Gastrointestinal: Negative.   Musculoskeletal: Positive for back pain.  Skin: Negative.   Neurological: Positive for focal weakness and weakness.       Weakness in legs and walking significant chronic back pain  Endo/Heme/Allergies: Negative.   Psychiatric/Behavioral: Negative.     Blood pressure 140/71, pulse 62, temperature 98.2 F (36.8 C), temperature source Oral, resp. rate 18, height 5\' 8"  (1.727 m), weight 76.204 kg (168 lb), SpO2 99 %. Physical Exam  Constitutional: He appears well-developed and well-nourished.  HENT:  Head: Normocephalic and atraumatic.  Eyes: Conjunctivae and EOM are normal. Pupils are equal, round, and reactive to light.  Neck: Normal range of motion. Neck supple.  Cardiovascular: Normal rate, regular rhythm and normal heart sounds.   Respiratory: Effort normal and breath sounds normal.  GI: Soft.  Musculoskeletal:  Positive straight leg raising at 45 in either lower extremity  Neurological:  Good strength to confrontational testing and iliopsoas quadriceps tibialis anterior and gastrocs. Absent deep tendon reflexes in patella and Achilles. Upper extremity reflexes trace in biceps absent in the triceps. Cranial nerve examination is normal. Station and gait are intact.  Skin: Skin is warm and dry.  Psychiatric: He has a normal mood and affect. His behavior is normal. Judgment and thought content normal.     Assessment/Plan Lumbar spinal stenosis L2-3 L3-4 L4-5.  Decompression L2-3 L3-4 L4-5 with bilateral  laminotomies and foraminotomies.  Earleen Newport, MD 02/27/2016, 12:32 PM

## 2016-02-27 NOTE — Anesthesia Postprocedure Evaluation (Signed)
Anesthesia Post Note  Patient: Nathan Kim  Procedure(s) Performed: Procedure(s) (LRB): LUMBAR LAMINECTOMY/DECOMPRESSION MICRODISCECTOMY 3 LEVELS (N/A)  Patient location during evaluation: PACU Anesthesia Type: General Level of consciousness: awake Pain management: pain level controlled Respiratory status: spontaneous breathing Cardiovascular status: stable Postop Assessment: no signs of nausea or vomiting Anesthetic complications: no    Last Vitals:  Filed Vitals:   02/27/16 0933 02/27/16 1545  BP: 140/71 159/95  Pulse: 62 68  Temp: 36.8 C 36.2 C  Resp: 18 22    Last Pain: There were no vitals filed for this visit.               Deovion Batrez

## 2016-02-27 NOTE — Anesthesia Procedure Notes (Signed)
Procedure Name: Intubation Date/Time: 02/27/2016 1:20 PM Performed by: Lance Coon Pre-anesthesia Checklist: Patient identified, Emergency Drugs available, Suction available and Patient being monitored Patient Re-evaluated:Patient Re-evaluated prior to inductionOxygen Delivery Method: Circle System Utilized Preoxygenation: Pre-oxygenation with 100% oxygen Intubation Type: IV induction Ventilation: Mask ventilation without difficulty Laryngoscope Size: Miller and 3 Grade View: Grade I Tube type: Oral Tube size: 7.5 mm Number of attempts: 1 Airway Equipment and Method: Stylet and Oral airway Placement Confirmation: ETT inserted through vocal cords under direct vision,  positive ETCO2 and breath sounds checked- equal and bilateral Secured at: 23 cm Tube secured with: Tape Dental Injury: Teeth and Oropharynx as per pre-operative assessment  Comments: Pt neck very kyphotic/did not interfere

## 2016-02-27 NOTE — Anesthesia Preprocedure Evaluation (Addendum)
Anesthesia Evaluation  Patient identified by MRN, date of birth, ID band Patient awake    Reviewed: Allergy & Precautions, H&P , NPO status , Patient's Chart, lab work & pertinent test results, reviewed documented beta blocker date and time   Airway Mallampati: II  TM Distance: >3 FB Neck ROM: full    Dental no notable dental hx. (+) Dental Advisory Given, Teeth Intact   Pulmonary neg pulmonary ROS, former smoker,    Pulmonary exam normal breath sounds clear to auscultation       Cardiovascular hypertension, Pt. on medications and Pt. on home beta blockers Normal cardiovascular exam+ dysrhythmias Atrial Fibrillation  Rhythm:regular Rate:Normal  ECG - AF. Grade 2 diastolic dysfunction   Neuro/Psych negative neurological ROS  negative psych ROS   GI/Hepatic negative GI ROS, Neg liver ROS,   Endo/Other  negative endocrine ROS  Renal/GU negative Renal ROS  negative genitourinary   Musculoskeletal  (+) Arthritis , Osteoarthritis,    Abdominal   Peds  Hematology negative hematology ROS (+)   Anesthesia Other Findings   Reproductive/Obstetrics negative OB ROS                           Anesthesia Physical Anesthesia Plan  ASA: III  Anesthesia Plan: General   Post-op Pain Management:    Induction: Intravenous  Airway Management Planned: Oral ETT  Additional Equipment:   Intra-op Plan:   Post-operative Plan: Extubation in OR  Informed Consent: I have reviewed the patients History and Physical, chart, labs and discussed the procedure including the risks, benefits and alternatives for the proposed anesthesia with the patient or authorized representative who has indicated his/her understanding and acceptance.   Dental Advisory Given  Plan Discussed with: CRNA and Surgeon  Anesthesia Plan Comments:         Anesthesia Quick Evaluation

## 2016-02-27 NOTE — Op Note (Signed)
Date of surgery: 02/27/2016 Preoperative diagnosis: Lumbar stenosis with neurogenic claudication and lumbar radiculopathy L2-3 L3-4 L4-5. Postoperative diagnosis: Same Procedure: Bilateral laminotomies L2-3 L3-4 L4-5 decompression of the L2 L3 L4 and L5 nerve roots. Surgeon: Kristeen Miss First assistant: Ashley Jacobs M.D. Anesthesia: Gen. endotracheal Indications: Nathan Kim is a 83 year old individual who's had progressive lumbar spinal stenosis for number of years. He responded well to intermittent epidural steroid injections but lately he was noticing a significant diminution in his strength and his stamina and his legs. Is advised that he undergo surgical decompression at the worst levels of stenosis that is L2-3 L3-4 and L4-5. He is taken to the operating room for that procedure.  Procedure: The patient was brought to the operating room supine on a stretcher. After the smooth induction of general endotracheal anesthesia, he was turned prone. The bony prominences were carefully padded and protected. The back was prepped with alcohol DuraPrep and draped in a sterile fashion. Midline incision was created and carried down to the lumbodorsal fascia. The fascia was opened on the side of midline and subperiosteal dissection was undertaken to expose L2-3 L3-4 and L4-5. L3-4 was localized positively with the radiograph. Then starting at L3-4 laminotomy was created on the right side removing the inferior marginal lamina of L3 at the medial wall the facet. Dissection was taken down to the yellow ligament and yellow ligament was then carefully elevated identifying interlaminar space. A series of 2 and 3 mm Kerrison punches were then used to enlarge laminotomy and remove further redundant yellow ligament material out over the region of the facet. Common dural tube was decompressed as was lateral recesses at L3-L4. Once the decompression was completed the nerve roots could be sounded superiorly at L3 and  inferiorly L4 similar process was carried out on the opposite side. Then by advancing the retractors superiorly L2-3 was decompressed similarly with the individual L2 and L3 nerve roots being decompressed again with 2 mm Kerrison punches and high-speed drill as needed. L4-5 was decompressed then in a similar fashion with care taken to decompress the L4 nerve root superiorly and the L5 nerve root inferiorly. In the end the decompression was had at each level nerve roots could easily be sounded out the foramen hemostasis was well achieved no dural incursions had occurred. Once hemostasis was verified in all the soft tissues the retractors were removed the lumbar dorsal fascia was closed with #1 Vicryl interrupted fashion 20 mL of half percent Marcaine was injected into the paraspinous fascia 2-0 Vicryl was used in subcutaneous and his tissues and 3-0 Vicryl subcuticularly. Blood loss is estimated at 100 mL. Patient tolerated procedure well.

## 2016-02-28 MED ORDER — HYDROCODONE-ACETAMINOPHEN 5-325 MG PO TABS: 1.0000 | ORAL_TABLET | ORAL | Status: DC | PRN

## 2016-02-28 MED ORDER — ASPIRIN 81 MG PO TABS: 81.0000 mg | ORAL_TABLET | Freq: Every day | ORAL | Status: DC

## 2016-02-28 MED ORDER — TAMSULOSIN HCL 0.4 MG PO CAPS
0.4000 mg | ORAL_CAPSULE | Freq: Every day | ORAL | Status: DC
  Administered 2016-02-28 (×2): 0.4 mg via ORAL

## 2016-02-28 MED ORDER — TAMSULOSIN HCL 0.4 MG PO CAPS: 0.4000 mg | ORAL_CAPSULE | Freq: Every day | ORAL | Status: DC

## 2016-02-28 NOTE — Evaluation (Signed)
Physical Therapy Evaluation Patient Details Name: Nathan Kim MRN: [id number] DOB: 22-Feb-1920 Today's Date: 02/28/2016   History of Present Illness  82 y.o. male s/p L2-5 laminectomy  Clinical Impression  Patient demonstrates deficits in functional mobility as indicated below. Will benefit from continued skilled PT to address deficits and maximize function. Will see as indicated and progress as tolerated. Encourage continued mobility with staff. Recommend initial HHPT upon acute discharge to ensure safety at home with mobility.     Follow Up Recommendations Home health PT;Supervision for mobility/OOB    Equipment Recommendations  None recommended by PT    Recommendations for Other Services       Precautions / Restrictions Precautions Precautions: Back Precaution Booklet Issued: Yes (comment) Required Braces or Orthoses: Spinal Brace Spinal Brace: Lumbar corset      Mobility  Bed Mobility Overal bed mobility: Needs Assistance Bed Mobility: Rolling;Sidelying to Sit Rolling: Min guard Sidelying to sit: Min guard       General bed mobility comments: VCs for technique and sequencing, increased time to perform  Transfers Overall transfer level: Needs assistance Equipment used: Rolling walker (2 wheeled) Transfers: Sit to/from Stand Sit to Stand: Min assist         General transfer comment: min assist from elevated surface. performed x3 with cues for hand placement and positioning. + dizziness upon inital standing.  Ambulation/Gait Ambulation/Gait assistance: Min guard Ambulation Distance (Feet): 170 Feet Assistive device: Rolling walker (2 wheeled) Gait Pattern/deviations: Step-through pattern;Decreased stride length;Trunk flexed;Narrow base of support Gait velocity: decreased Gait velocity interpretation: Below normal speed for age/gender General Gait Details: initially slow cadence, Cues for upright posture and positioning within RW. Improved with distance and  cues.   Stairs            Wheelchair Mobility    Modified Rankin (Stroke Patients Only)       Balance Overall balance assessment: Needs assistance Sitting-balance support: Feet supported Sitting balance-Leahy Scale: Good     Standing balance support: Bilateral upper extremity supported;During functional activity Standing balance-Leahy Scale: Poor Standing balance comment: reliance on Rw for static activity at this time                             Pertinent Vitals/Pain Pain Assessment: No/denies pain    Home Living Family/patient expects to be discharged to:: Private residence Living Arrangements: Spouse/significant other Available Help at Discharge: Family;Available PRN/intermittently Type of Home: House Home Access: Stairs to enter Entrance Stairs-Rails: Left Entrance Stairs-Number of Steps: 3 Home Layout: One level Home Equipment: Walker - 2 wheels;Cane - single point;Bedside commode      Prior Function Level of Independence: Independent               Hand Dominance   Dominant Hand: Right    Extremity/Trunk Assessment   Upper Extremity Assessment: Overall WFL for tasks assessed           Lower Extremity Assessment: Generalized weakness      Cervical / Trunk Assessment: Kyphotic  Communication   Communication: No difficulties  Cognition Arousal/Alertness: Awake/alert Behavior During Therapy: WFL for tasks assessed/performed Overall Cognitive Status: Within Functional Limits for tasks assessed                      General Comments General comments (skin integrity, edema, etc.): educated extensively regarding precautions, mobility expectations and safety.    Exercises        Assessment/Plan  PT Assessment Patient needs continued PT services  PT Diagnosis Difficulty walking;Abnormality of gait;Acute pain   PT Problem List Decreased strength;Decreased activity tolerance;Decreased balance;Decreased  mobility;Decreased knowledge of use of DME;Pain  PT Treatment Interventions DME instruction;Gait training;Stair training;Functional mobility training;Therapeutic activities;Therapeutic exercise;Balance training;Patient/family education   PT Goals (Current goals can be found in the Care Plan section) Acute Rehab PT Goals Patient Stated Goal: to go home PT Goal Formulation: With patient Time For Goal Achievement: 03/13/16 Potential to Achieve Goals: Good    Frequency Min 5X/week   Barriers to discharge Decreased caregiver support      Co-evaluation               End of Session Equipment Utilized During Treatment: Gait belt;Back brace Activity Tolerance: Patient tolerated treatment well Patient left: in chair;with call bell/phone within reach;with chair alarm set Nurse Communication: Mobility status         Time: [id number] PT Time Calculation (min) (ACUTE ONLY): 24 min   Charges:   PT Evaluation $PT Eval Moderate Complexity: 1 Procedure PT Treatments $Gait Training: 8-22 mins   PT G CodesDuncan Dull 2016/03/19, 10:03 AM Alben Deeds, PT DPT  [phone]

## 2016-02-28 NOTE — Progress Notes (Signed)
Pt seen and examined. Reports minimal back pain. Had difficulty with voiding, required straight cath last night. Able to void a little into urinal. Walking around unit well  EXAM: Temp:  [97.1 F (36.2 C)-97.7 F (36.5 C)] 97.4 F (36.3 C) (05/27 0800) Pulse Rate:  [48-106] 87 (05/27 0800) Resp:  [11-23] 14 (05/27 0800) BP: (104-159)/(48-133) 118/65 mmHg (05/27 0800) SpO2:  [93 %-100 %] 95 % (05/27 0800) Weight:  [76.1 kg (167 lb 12.3 oz)] 76.1 kg (167 lb 12.3 oz) (05/26 1545) Intake/Output      05/26 0701 - 05/27 0700 05/27 0701 - 05/28 0700   P.O. 60    I.V. (mL/kg) 2536.7 (33.3) 200 (2.6)   Total Intake(mL/kg) 2596.7 (34.1) 200 (2.6)   Urine (mL/kg/hr) 1400 50 (0.2)   Blood 200    Total Output 1600 50   Net +996.7 +150        Urine Occurrence 1 x     Awake, alert, oriented Speech fluent Good strength BLE Wound c/d/i  LABS: Lab Results  Component Value Date   CREATININE 1.56* 02/19/2016   BUN 31* 02/19/2016   NA 138 02/19/2016   K 4.8 02/19/2016   CL 105 02/19/2016   CO2 25 02/19/2016   Lab Results  Component Value Date   WBC 7.8 02/19/2016   HGB 13.3 02/19/2016   HCT 41.3 02/19/2016   MCV 91.8 02/19/2016   PLT 217 02/19/2016    IMPRESSION: - 82 y.o. male POD#1 s/p L2-5 laminectomy, doing very well. Mild retention seems to be improving  PLAN: - Will cont to monitor urinary retention, add Flomax. If voiding can d/c home later this pm

## 2016-03-01 ENCOUNTER — Encounter (HOSPITAL_COMMUNITY): Payer: Self-pay | Admitting: Neurological Surgery

## 2016-03-01 NOTE — Progress Notes (Signed)
02/29/16  11:00 W. Stann Mainland RN BSN NCM [phone] Contacted patient to arrange [facility] services.  CM contacted patient and daughter Theodis Shove [phone] by phone to set up [facility] services. Discussed the recommendations for [facility] PT, patient and family agreeable. Offered choice selected [facility]. Referral called in to [facility] confirmed services. Denies the need for any equipment. Explained to patient and daughter that someone from [facility] will contact patient and family at the verified number to arrange initial visit. Patient and daughter verbalize understanding teach back done. No further question or concerns noted at this time, CM provided patient and daughter with CM contact information if any further questions or concerns should arise. No further CM needs identified.

## 2016-03-03 NOTE — Discharge Summary (Signed)
  Physician Discharge Summary  Patient ID: Nathan Kim MRN: [id number] DOB/AGE: 80-22-1921 80 y.o.  Admit date: 02/27/2016 Discharge date: 03/03/2016  Admission Diagnoses: Lumbar stenosis  Discharge Diagnoses: Same Active Problems:   Lumbar stenosis with neurogenic claudication   Discharged Condition: Stable  Hospital Course:  Mrs. Nathan Kim is a 82 y.o. male electively admitted after uncomplicated lumbar laminectomy for stenosis. He was at neurologic baseline postop, reporting improvement in his walking. Pain was well controlled with oral medication. He was tolerating diet. He did have some difficulty voiding and was started on Flomax. He was able to void partially although he did have some residual urine on bladder scan. He did not have any abdominal discomfort. He was therefore discharged in stable condition.  Treatments: Surgery - Lumbar laminectomy  Discharge Exam: Blood pressure 107/51, pulse 57, temperature 98.1 F (36.7 C), temperature source Oral, resp. rate 18, height 5\' 8"  (1.727 m), weight 76.1 kg (167 lb 12.3 oz), SpO2 92 %. Awake, alert, oriented Speech fluent, appropriate CN grossly intact 5/5 BUE/BLE Wound c/d/i  Disposition: 01-Home or Self Care     Medication List    STOP taking these medications        traMADol 50 MG tablet  Commonly known as:  ULTRAM      TAKE these medications        amLODipine 2.5 MG tablet  Commonly known as:  NORVASC  Take 1 tablet by mouth daily.     aspirin 81 MG tablet  Take 1 tablet (81 mg total) by mouth daily.     HYDROcodone-acetaminophen 5-325 MG tablet  Commonly known as:  NORCO/VICODIN  Take 1-2 tablets by mouth every 4 (four) hours as needed (mild pain).     lisinopril 20 MG tablet  Commonly known as:  PRINIVIL,ZESTRIL  Take 1 tablet by mouth daily.     metoprolol tartrate 25 MG tablet  Commonly known as:  LOPRESSOR  Take 0.5 tablets (12.5 mg total) by mouth 2 (two) times daily.     tamsulosin  0.4 MG Caps capsule  Commonly known as:  FLOMAX  Take 1 capsule (0.4 mg total) by mouth daily after supper.           Follow-up Information    Follow up with Earleen Newport, MD In 2 weeks.   Specialty:  Neurosurgery   Contact information:   [address] [place] [address] [phone]       Signed: Consuella Lose, Loletha Grayer 03/03/2016, 9:17 AM

## 2016-03-10 ENCOUNTER — Telehealth: Payer: Self-pay | Admitting: Cardiovascular Disease

## 2016-03-10 NOTE — Telephone Encounter (Signed)
New message  Pt daughter called states that she would rather her father be seen in [place]. Request a call back with a recommendation of whom her father should see. Please call

## 2016-03-12 NOTE — Telephone Encounter (Signed)
Follow Up  Pt daughter called to follow up on her request. Please call

## 2016-03-12 NOTE — Telephone Encounter (Signed)
Left msg for caller to inform her this has been sent to Dr. Claiborne Billings to review and give OK/recommendation for new provider.

## 2016-03-14 NOTE — Telephone Encounter (Signed)
Ok to see one of our cardiologist's in [place]

## 2016-03-15 NOTE — Telephone Encounter (Signed)
Pt scheduled for 03/31/16 @ 10 AM with Dr. Bronson Ing. Pt confirmed appt and gave directions to office location

## 2016-03-15 NOTE — Telephone Encounter (Signed)
Need in 2-3 weeks if possible , patient was discharge from hospital 03/03/16 Last visit 02/25/16

## 2016-03-15 NOTE — Telephone Encounter (Signed)
Please clarify when pt needs to be seen, not indicated in LOV or echo results

## 2016-03-15 NOTE — Telephone Encounter (Signed)
Spoke to daughter , informed her okay to switch to [employer]  will defer information to [facility] triage pool to set up follow up appointment with physician Daughter verbalized understanding.

## 2016-03-31 ENCOUNTER — Ambulatory Visit (INDEPENDENT_AMBULATORY_CARE_PROVIDER_SITE_OTHER): Payer: Medicare Other | Admitting: Cardiovascular Disease

## 2016-03-31 ENCOUNTER — Encounter: Payer: Self-pay | Admitting: Cardiovascular Disease

## 2016-03-31 VITALS — BP 161/70 | HR 57 | Ht 68.0 in | Wt 182.0 lb

## 2016-03-31 DIAGNOSIS — I480 Paroxysmal atrial fibrillation: Secondary | ICD-10-CM | POA: Diagnosis not present

## 2016-03-31 DIAGNOSIS — I10 Essential (primary) hypertension: Secondary | ICD-10-CM

## 2016-03-31 DIAGNOSIS — Z7189 Other specified counseling: Secondary | ICD-10-CM

## 2016-03-31 DIAGNOSIS — I493 Ventricular premature depolarization: Secondary | ICD-10-CM

## 2016-03-31 DIAGNOSIS — R600 Localized edema: Secondary | ICD-10-CM

## 2016-03-31 MED ORDER — FUROSEMIDE 20 MG PO TABS: 20.0000 mg | ORAL_TABLET | ORAL | Status: DC | PRN

## 2016-03-31 NOTE — Progress Notes (Signed)
Patient ID: Nathan Kim, male   DOB: 10/02/1920, 82 y.o.   MRN: [id number]      SUBJECTIVE: The patient is a 82 year old male with a history of hypertension and paroxysmal atrial fibrillation. He saw Dr. Georgina Peer in May. Echocardiogram showed normal left ventricular systolic function and grade 2 diastolic dysfunction with normal regional wall motion and LVH. He is on aspirin 81 mg. ECG in May showed sinus rhythm with PVCs, right bundle branch block, and left anterior fascicular block.  He said he does a lot of hard work such as cutting wood and hauling wood and denies exertional chest pain and shortness of breath. He is slow to recover from his back surgery. He has had some mild bilateral leg swelling over the past 2 weeks since his back surgery. He has occasional palpitations. He denies a history of bleeding problems.  Review of Systems: As per "subjective", otherwise negative.  No Known Allergies  Current Outpatient Prescriptions  Medication Sig Dispense Refill  . amLODipine (NORVASC) 2.5 MG tablet Take 1 tablet by mouth daily.  4  . aspirin 81 MG tablet Take 1 tablet (81 mg total) by mouth daily.    Marland Kitchen HYDROcodone-acetaminophen (NORCO/VICODIN) 5-325 MG tablet Take 1-2 tablets by mouth every 4 (four) hours as needed (mild pain). 50 tablet 0  . lisinopril (PRINIVIL,ZESTRIL) 20 MG tablet Take 1 tablet by mouth daily.  3  . metoprolol tartrate (LOPRESSOR) 25 MG tablet Take 0.5 tablets (12.5 mg total) by mouth 2 (two) times daily. 60 tablet 3  . tamsulosin (FLOMAX) 0.4 MG CAPS capsule Take 1 capsule (0.4 mg total) by mouth daily after supper. 30 capsule 0   No current facility-administered medications for this visit.    Past Medical History  Diagnosis Date  . Hypertension   . Arthritis   . BPH (benign prostatic hyperplasia)     Past Surgical History  Procedure Laterality Date  . Carpel tunnel    . Eye surgery    . Back surgery    . Hernia repair    . Lumbar  laminectomy/decompression microdiscectomy N/A 02/27/2016    Procedure: LUMBAR LAMINECTOMY/DECOMPRESSION MICRODISCECTOMY 3 LEVELS;  Surgeon: Kristeen Miss, MD;  Location: [facility];  Service: Neurosurgery;  Laterality: N/A;  L2-3 L3-4 L4-5 Laminectomy    Social History   Social History  . Marital Status: Married    Spouse Name: N/A  . Number of Children: N/A  . Years of Education: N/A   Occupational History  . Not on file.   Social History Main Topics  . Smoking status: Former Smoker -- 0.50 packs/day for 50 years    Types: Cigarettes    Start date: 07/03/1929    Quit date: 07/04/1979  . Smokeless tobacco: Never Used  . Alcohol Use: No  . Drug Use: No  . Sexual Activity: Not on file   Other Topics Concern  . Not on file   Social History Narrative     Filed Vitals:   03/31/16 0941  BP: 161/70  Pulse: 57  Height: 5\' 8"  (1.727 m)  Weight: 182 lb (82.555 kg)  SpO2: 96%    PHYSICAL EXAM General: NAD HEENT: Normal. Neck: No JVD, no thyromegaly. Lungs: Clear to auscultation bilaterally with normal respiratory effort. CV: Nondisplaced PMI.  Regular rate and rhythm with occasional premature contractions, normal S1/S2, no S3/S4, no murmur. 1+ pitting pretibial edema b/l. Abdomen: Soft, nontender, no distention.  Neurologic: Alert and oriented.  Psych: Normal affect. Skin: Normal. Musculoskeletal: No gross deformities.  ECG: Most recent ECG reviewed.      ASSESSMENT AND PLAN: 1. Paroxysmal a fib: Continue metoprolol 12.5 mg twice daily. For now continue aspirin although no clear benefit in terms of thromboembolic risk reduction. We discussed anticoagulation therapy and he and his family will think about it.  2. Essential HTN: Elevated today, reportedly normal otherwise. No changes.  3. Bilateral leg edema: Will given Lasix 20 mg prn to be taken for 2-3 days.  Dispo: fu 1 year.   Kate Sable, M.D., F.A.C.C.

## 2016-03-31 NOTE — Patient Instructions (Signed)
Your physician wants you to follow-up in: [place] Bronson Ing You will receive a reminder letter in the mail two months in advance. If you don't receive a letter, please call our office to schedule the follow-up appointment.   Your physician has recommended you make the following change in your medication:   TAKE LASIX 20 MG AS NEEDED   Thank you for choosing [facility]!!

## 2016-04-05 ENCOUNTER — Telehealth: Payer: Self-pay | Admitting: Cardiovascular Disease

## 2016-04-05 NOTE — Telephone Encounter (Signed)
Daughter says she would call back Wednesday - wanted to speak with pt 1st.

## 2016-04-05 NOTE — Telephone Encounter (Signed)
Daughter [name]) called stating that Mr. Phillipson is taking the Lasix 20mg . States that his feet and ankles continue to swell.  Please call daughter cell [phone] or work # [phone]

## 2016-04-05 NOTE — Telephone Encounter (Signed)
Can try increasing to 40 mg daily x 4-5 days. Will need BMET in 2 days.

## 2016-04-05 NOTE — Telephone Encounter (Signed)
Daughter [name] says pt has taken lasix 20 mg daily for last 5 days (since LOV) and swelling hasn't changed in feet and legs. Says no weight gain or SOB. Will forward to provider

## 2016-04-08 NOTE — Telephone Encounter (Signed)
LM for daughter. Did not receive call back

## 2016-10-04 DIAGNOSIS — I809 Phlebitis and thrombophlebitis of unspecified site: Secondary | ICD-10-CM

## 2016-10-04 HISTORY — DX: Phlebitis and thrombophlebitis of unspecified site: I80.9

## 2016-11-26 ENCOUNTER — Encounter: Payer: Self-pay | Admitting: Cardiovascular Disease

## 2016-11-26 ENCOUNTER — Ambulatory Visit (INDEPENDENT_AMBULATORY_CARE_PROVIDER_SITE_OTHER): Payer: Medicare Other | Admitting: Cardiovascular Disease

## 2016-11-26 ENCOUNTER — Telehealth: Payer: Self-pay

## 2016-11-26 VITALS — BP 147/73 | HR 74 | Ht 70.0 in | Wt 170.0 lb

## 2016-11-26 DIAGNOSIS — R600 Localized edema: Secondary | ICD-10-CM

## 2016-11-26 DIAGNOSIS — Z79899 Other long term (current) drug therapy: Secondary | ICD-10-CM

## 2016-11-26 DIAGNOSIS — I480 Paroxysmal atrial fibrillation: Secondary | ICD-10-CM | POA: Diagnosis not present

## 2016-11-26 DIAGNOSIS — I10 Essential (primary) hypertension: Secondary | ICD-10-CM

## 2016-11-26 DIAGNOSIS — R0602 Shortness of breath: Secondary | ICD-10-CM | POA: Diagnosis not present

## 2016-11-26 MED ORDER — FUROSEMIDE 20 MG PO TABS: 20.0000 mg | ORAL_TABLET | Freq: Every day | ORAL | 3 refills | Status: DC

## 2016-11-26 NOTE — Patient Instructions (Signed)
Your physician wants you to follow-up in: 6 months with Dr Virgina Jock will receive a reminder letter in the mail two months in advance. If you don't receive a letter, please call our office to schedule the follow-up appointment.       Please call back with lasix dose       Thank you for choosing [facility] !

## 2016-11-26 NOTE — Telephone Encounter (Signed)
-----   Message from [name], MD sent at 11/26/2016  3:36 PM EST ----- Regarding: RE: fu Would have him take 40 mg Lasix daily x 5 days. Then reduce to 20 mg daily. Check BMET Monday. Don't take potassium yet as he had CKD last summer, and this can predispose to hyperkalemia.  ----- Message ----- From: Bernita Raisin, RN Sent: 11/26/2016   3:14 PM To: [name], MD Subject: fu                                             [?2/?23/?2018 3:13 PM] Alphonsus Sias:  30 Furosemide 20mg  tablets w/ 1 refill and Klor Con m10er/73meq tab san 30 w/ one refill for Dereck Leep per patient's daughter

## 2016-11-26 NOTE — Progress Notes (Signed)
SUBJECTIVE:The patient is a 83 year old male with a history of hypertension, bilateral leg edema, and paroxysmal atrial fibrillation.   Echocardiogram May 2017 showed normal left ventricular systolic function and grade 2 diastolic dysfunction with normal regional wall motion and LVH.   Most recent echocardiogram performed at an outside facility on 11/15/16 showed normal LV systolic function, EF 09-32%, mild LVH, mild biatrial enlargement, PASP 43 mmHg.  Right lower extremity ultrasound 10/15/16: No DVT. +superficial vein thrombus.  Labs 04/15/16: BUN 34, creatinine 1.55, Na 142, LDL 94, TC 155, TG 112, HDL 39, TSH 6.1, Hgb 12.9.  Has had problems with right leg swelling and discomfort. Given 90 Lasix pills by PCP but patient did not bring them and said he wasn't sure if he took them. Does not remember the dosage.  Says he has been more short of breath lately. Normally wears compression stockings but didn't today. Occasionally keeps legs elevated.     Review of Systems: As per "subjective", otherwise negative.  No Known Allergies  Current Outpatient Prescriptions  Medication Sig Dispense Refill  . amLODipine (NORVASC) 2.5 MG tablet Take 1 tablet by mouth daily.  4  . aspirin 81 MG tablet Take 1 tablet (81 mg total) by mouth daily.    Marland Kitchen lisinopril (PRINIVIL,ZESTRIL) 20 MG tablet Take 1 tablet by mouth daily.  3  . metoprolol tartrate (LOPRESSOR) 25 MG tablet Take 0.5 tablets (12.5 mg total) by mouth 2 (two) times daily. 60 tablet 3   No current facility-administered medications for this visit.     Past Medical History:  Diagnosis Date  . Arthritis   . BPH (benign prostatic hyperplasia)   . Hypertension   . Phlebitis 10/2016    Past Surgical History:  Procedure Laterality Date  . BACK SURGERY    . carpel tunnel    . EYE SURGERY    . HERNIA REPAIR    . LUMBAR LAMINECTOMY/DECOMPRESSION MICRODISCECTOMY N/A 02/27/2016   Procedure: LUMBAR LAMINECTOMY/DECOMPRESSION  MICRODISCECTOMY 3 LEVELS;  Surgeon: Kristeen Miss, MD;  Location: [facility];  Service: Neurosurgery;  Laterality: N/A;  L2-3 L3-4 L4-5 Laminectomy    Social History   Social History  . Marital status: Married    Spouse name: N/A  . Number of children: N/A  . Years of education: N/A   Occupational History  . Not on file.   Social History Main Topics  . Smoking status: Former Smoker    Packs/day: 0.50    Years: 50.00    Types: Cigarettes    Start date: 07/03/1929    Quit date: 07/04/1979  . Smokeless tobacco: Never Used  . Alcohol use No  . Drug use: No  . Sexual activity: Not on file   Other Topics Concern  . Not on file   Social History Narrative  . No narrative on file     Vitals:   11/26/16 1356  BP: (!) 147/73  Pulse: 74  SpO2: 95%  Weight: 170 lb (77.1 kg)  Height: 5\' 10"  (1.778 m)    PHYSICAL EXAM General: NAD HEENT: Normal. Neck: No JVD, no thyromegaly. Lungs: Clear to auscultation bilaterally with normal respiratory effort. CV: Nondisplaced PMI.  Regular rate and rhythm, normal S1/S2, no S3/S4, no murmur. 1+ pitting right pretibial and trace left leg edema. Bilateral venous varicosities.   Abdomen: Soft, nontender, no distention.  Neurologic: Alert and oriented.  Psych: Normal affect. Skin: Normal. Musculoskeletal: No gross deformities.    ECG: Most recent ECG reviewed.  ASSESSMENT AND PLAN:  1. Paroxysmal atrial fibrillation: Symptomatically stable. Continue metoprolol 12.5 mg twice daily. For now continue aspirin although no clear benefit in terms of thromboembolic risk reduction. We previously discussed anticoagulation therapy but he declined.  2. Essential HTN: Elevated. Will monitor.  3. Bilateral leg edema and shortness of breath: I have asked him to call me back with Lasix dosage so I can determine how much he should take.  Dispo: fu 6 months.  Time spent: 40 minutes, of which greater than 50% was spent reviewing symptoms,  relevant blood tests and studies, and discussing management plan with the patient.   Kate Sable, M.D., F.A.C.C.

## 2016-11-26 NOTE — Telephone Encounter (Signed)
I spoke with pt,he will take 40 mg lasix daily for 5 days and then reduce to 20 mg daily after words.he will get BMET on Monday at [facility]   He will not take potassium    escribed lasix to pharmacy

## 2016-11-30 LAB — BASIC METABOLIC PANEL
BUN: 43 mg/dL — AB (ref 7–25)
CHLORIDE: 104 mmol/L (ref 98–110)
CO2: 29 mmol/L (ref 20–31)
CREATININE: 1.81 mg/dL — AB (ref 0.70–1.11)
Calcium: 9.4 mg/dL (ref 8.6–10.3)
Glucose, Bld: 143 mg/dL — ABNORMAL HIGH (ref 65–99)
Potassium: 4.5 mmol/L (ref 3.5–5.3)
Sodium: 141 mmol/L (ref 135–146)

## 2016-12-01 ENCOUNTER — Telehealth: Payer: Self-pay

## 2016-12-01 DIAGNOSIS — Z79899 Other long term (current) drug therapy: Secondary | ICD-10-CM

## 2016-12-01 NOTE — Telephone Encounter (Signed)
Called pt, no answer. Left message for pt (family) TO RETURN CALL. Put in lab order.

## 2016-12-01 NOTE — Telephone Encounter (Signed)
-----   Message from [name], MD sent at 12/01/2016  9:06 AM EST ----- Now that he is back to Lasix 20 mg daily, would repeat BMET in 2 weeks.

## 2016-12-16 LAB — BASIC METABOLIC PANEL
BUN: 42 mg/dL — ABNORMAL HIGH (ref 7–25)
CALCIUM: 9.4 mg/dL (ref 8.6–10.3)
CHLORIDE: 106 mmol/L (ref 98–110)
CO2: 26 mmol/L (ref 20–31)
Creat: 1.78 mg/dL — ABNORMAL HIGH (ref 0.70–1.11)
GLUCOSE: 103 mg/dL — AB (ref 65–99)
POTASSIUM: 4.7 mmol/L (ref 3.5–5.3)
SODIUM: 143 mmol/L (ref 135–146)

## 2016-12-20 ENCOUNTER — Telehealth: Payer: Self-pay

## 2016-12-20 DIAGNOSIS — I10 Essential (primary) hypertension: Secondary | ICD-10-CM

## 2016-12-20 MED ORDER — FUROSEMIDE 20 MG PO TABS: 20.0000 mg | ORAL_TABLET | ORAL | 3 refills | Status: DC

## 2016-12-20 NOTE — Telephone Encounter (Signed)
Pt verbalized understanding to decrease lasix to 20 mg every other day   And repeat bmet in 1 month, I mailed lab slip

## 2016-12-20 NOTE — Telephone Encounter (Signed)
-----   Message from [name], MD sent at 12/20/2016 12:01 PM EDT ----- Have him take Lasix every other day. Repeat BMET in one month.

## 2017-01-21 LAB — BASIC METABOLIC PANEL
BUN/Creatinine Ratio: 21 (ref 10–24)
BUN: 36 mg/dL (ref 10–36)
CALCIUM: 9.1 mg/dL (ref 8.6–10.2)
CO2: 22 mmol/L (ref 18–29)
Chloride: 102 mmol/L (ref 96–106)
Creatinine, Ser: 1.73 mg/dL — ABNORMAL HIGH (ref 0.76–1.27)
GFR calc non Af Amer: 33 mL/min/{1.73_m2} — ABNORMAL LOW (ref >59)
GFR, EST AFRICAN AMERICAN: 38 mL/min/{1.73_m2} — AB (ref >59)
Glucose: 129 mg/dL — ABNORMAL HIGH (ref 65–99)
POTASSIUM: 4.6 mmol/L (ref 3.5–5.2)
Sodium: 140 mmol/L (ref 134–144)

## 2017-04-11 ENCOUNTER — Other Ambulatory Visit: Payer: Self-pay

## 2017-04-11 MED ORDER — METOPROLOL TARTRATE 25 MG PO TABS: 12.5000 mg | ORAL_TABLET | Freq: Two times a day (BID) | ORAL | 2 refills | Status: DC

## 2017-05-10 ENCOUNTER — Encounter: Payer: Self-pay | Admitting: Cardiovascular Disease

## 2017-05-10 ENCOUNTER — Ambulatory Visit (INDEPENDENT_AMBULATORY_CARE_PROVIDER_SITE_OTHER): Payer: Medicare Other | Admitting: Cardiovascular Disease

## 2017-05-10 VITALS — BP 128/58 | HR 60 | Ht 70.0 in | Wt 161.0 lb

## 2017-05-10 DIAGNOSIS — I481 Persistent atrial fibrillation: Secondary | ICD-10-CM | POA: Diagnosis not present

## 2017-05-10 DIAGNOSIS — Z01818 Encounter for other preprocedural examination: Secondary | ICD-10-CM | POA: Diagnosis not present

## 2017-05-10 DIAGNOSIS — R600 Localized edema: Secondary | ICD-10-CM

## 2017-05-10 DIAGNOSIS — I4819 Other persistent atrial fibrillation: Secondary | ICD-10-CM

## 2017-05-10 DIAGNOSIS — I10 Essential (primary) hypertension: Secondary | ICD-10-CM

## 2017-05-10 NOTE — Progress Notes (Signed)
SUBJECTIVE: The patient is a 83 year old male with a history of hypertension, bilateral leg edema, and paroxysmal atrial fibrillation.   Echocardiogram May 2017 showed normal left ventricular systolic function and grade 2 diastolic dysfunction with normal regional wall motion and LVH.   Most recent echocardiogram performed at an outside facility on 11/15/16 showed normal LV systolic function, EF 95-28%, mild LVH, mild biatrial enlargement, PASP 43 mmHg.  ECG performed in the office today demonstrates rate controlled atrial fibrillation with left anterior fascicular block.  He has been scheduled for right total hip arthroplasty with Dr. Alvan Dame.  He has right hip and right knee pain. He denies chest pain, palpitations, and shortness of breath. His leg swelling is well controlled with Lasix.  Review of Systems: As per "subjective", otherwise negative.  No Known Allergies  Current Outpatient Prescriptions  Medication Sig Dispense Refill  . amLODipine (NORVASC) 2.5 MG tablet Take 1 tablet by mouth daily.  4  . aspirin 81 MG tablet Take 1 tablet (81 mg total) by mouth daily.    . furosemide (LASIX) 20 MG tablet Take 1 tablet (20 mg total) by mouth every other day. 45 tablet 3  . lisinopril (PRINIVIL,ZESTRIL) 20 MG tablet Take 1 tablet by mouth daily.  3  . metoprolol tartrate (LOPRESSOR) 25 MG tablet Take 0.5 tablets (12.5 mg total) by mouth 2 (two) times daily. 60 tablet 2  . TRAMADOL HCL ER PO Take by mouth.     No current facility-administered medications for this visit.     Past Medical History:  Diagnosis Date  . Arthritis   . BPH (benign prostatic hyperplasia)   . Hypertension   . Phlebitis 10/2016    Past Surgical History:  Procedure Laterality Date  . BACK SURGERY    . carpel tunnel    . EYE SURGERY    . HERNIA REPAIR    . LUMBAR LAMINECTOMY/DECOMPRESSION MICRODISCECTOMY N/A 02/27/2016   Procedure: LUMBAR LAMINECTOMY/DECOMPRESSION MICRODISCECTOMY 3 LEVELS;   Surgeon: Kristeen Miss, MD;  Location: [place] [facility];  Service: Neurosurgery;  Laterality: N/A;  L2-3 L3-4 L4-5 Laminectomy    Social History   Social History  . Marital status: Married    Spouse name: N/A  . Number of children: N/A  . Years of education: N/A   Occupational History  . Not on file.   Social History Main Topics  . Smoking status: Former Smoker    Packs/day: 0.50    Years: 50.00    Types: Cigarettes    Start date: 07/03/1929    Quit date: 07/04/1979  . Smokeless tobacco: Never Used  . Alcohol use No  . Drug use: No  . Sexual activity: Not on file   Other Topics Concern  . Not on file   Social History Narrative  . No narrative on file     Vitals:   05/10/17 1304  BP: (!) 128/58  Pulse: 60  SpO2: 98%  Weight: 161 lb (73 kg)  Height: 5\' 10"  (1.778 m)    Wt Readings from Last 3 Encounters:  05/10/17 161 lb (73 kg)  11/26/16 170 lb (77.1 kg)  03/31/16 182 lb (82.6 kg)     PHYSICAL EXAM General: NAD HEENT: Normal. Neck: No JVD, no thyromegaly. Lungs: Clear to auscultation bilaterally with normal respiratory effort. CV: Nondisplaced PMI.  Regular rate and irregular rhythm, normal S1/S2, no S3, no murmur. 1+ pitting right pretibial and trace left leg edema. Bilateral venous varicosities.   Abdomen: Soft, nontender,  no distention.  Neurologic: Alert and oriented.  Psych: Normal affect. Skin: Normal. Musculoskeletal: No gross deformities.    ECG: Most recent ECG reviewed.   Labs: Lab Results  Component Value Date/Time   K 4.6 01/20/2017 11:45 AM   BUN 36 01/20/2017 11:45 AM   CREATININE 1.73 (H) 01/20/2017 11:45 AM   CREATININE 1.78 (H) 12/16/2016 11:10 AM   HGB 13.3 02/19/2016 11:29 AM     Lipids: No results found for: LDLCALC, LDLDIRECT, CHOL, TRIG, HDL     ASSESSMENT AND PLAN:  1. Persistent atrial fibrillation: Symptomatically stable. Continue metoprolol 12.5 mg twice daily. For now continue aspirin although no clear benefit in  terms of thromboembolic risk reduction. We previously discussed anticoagulation therapy but he declined.  2. Essential HTN: Controlled. No changes.  3. Bilateral leg edema: Controlled on current dose of Lasix. No changes.  4. Preoperative risk stratification: While he is at an increased risk for major adverse cardiac event due to age, he is not at a prohibitive risk and I feel he can proceed with surgery as planned.    Disposition: Follow up 1 yr   Kate Sable, M.D., F.A.C.C.

## 2017-05-10 NOTE — Patient Instructions (Signed)

## 2017-05-10 NOTE — Progress Notes (Signed)
Please place orders in EPIC as patient is being scheduled for a pre-op appointment! Thank you! 

## 2017-05-17 NOTE — Patient Instructions (Signed)
Nathan Kim  05/17/2017   Your procedure is scheduled on: 05-24-17   Report to [facility] Main  Entrance Report to Admitting at 7:35 AM   Call this number if you have problems the morning of surgery  [phone]   Remember: ONLY 1 PERSON MAY GO WITH YOU TO SHORT STAY TO GET  READY MORNING OF [place].  Do not eat food or drink liquids :After Midnight.     Take these medicines the morning of surgery with A SIP OF WATER: Metoprolol Tartrate (Lopressor), and Flomax (Tamsulosin)                                You may not have any metal on your body including hair pins and              piercings  Do not wear jewelry, make-up, lotions, powders or perfumes, deodorant             Men may shave face and neck.   Do not bring valuables to the hospital. [name].  Contacts, dentures or bridgework may not be worn into surgery.  Leave suitcase in the car. After surgery it may be brought to your room.                 Please read over the following fact sheets you were given: _____________________________________________________________________             [facility] - Preparing for Surgery Before surgery, you can play an important role.  Because skin is not sterile, your skin needs to be as free of germs as possible.  You can reduce the number of germs on your skin by washing with CHG (chlorahexidine gluconate) soap before surgery.  CHG is an antiseptic cleaner which kills germs and bonds with the skin to continue killing germs even after washing. Please DO NOT use if you have an allergy to CHG or antibacterial soaps.  If your skin becomes reddened/irritated stop using the CHG and inform your nurse when you arrive at Short Stay. Do not shave (including legs and underarms) for at least 48 hours prior to the first CHG shower.  You may shave your face/neck. Please follow these instructions carefully:  1.  Shower  with CHG Soap the night before surgery and the  morning of Surgery.  2.  If you choose to wash your hair, wash your hair first as usual with your  normal  shampoo.  3.  After you shampoo, rinse your hair and body thoroughly to remove the  shampoo.                           4.  Use CHG as you would any other liquid soap.  You can apply chg directly  to the skin and wash                       Gently with a scrungie or clean washcloth.  5.  Apply the CHG Soap to your body ONLY FROM THE NECK DOWN.   Do not use on face/ open  Wound or open sores. Avoid contact with eyes, ears mouth and genitals (private parts).                       Wash face,  Genitals (private parts) with your normal soap.             6.  Wash thoroughly, paying special attention to the area where your surgery  will be performed.  7.  Thoroughly rinse your body with warm water from the neck down.  8.  DO NOT shower/wash with your normal soap after using and rinsing off  the CHG Soap.                9.  Pat yourself dry with a clean towel.            10.  Wear clean pajamas.            11.  Place clean sheets on your bed the night of your first shower and do not  sleep with pets. Day of Surgery : Do not apply any lotions/deodorants the morning of surgery.  Please wear clean clothes to the hospital/surgery center.  FAILURE TO FOLLOW THESE INSTRUCTIONS MAY RESULT IN THE CANCELLATION OF YOUR SURGERY PATIENT SIGNATURE_________________________________  NURSE SIGNATURE__________________________________  ________________________________________________________________________   Adam Phenix  An incentive spirometer is a tool that can help keep your lungs clear and active. This tool measures how well you are filling your lungs with each breath. Taking long deep breaths may help reverse or decrease the chance of developing breathing (pulmonary) problems (especially infection) following:  A long period  of time when you are unable to move or be active. BEFORE THE PROCEDURE   If the spirometer includes an indicator to show your best effort, your nurse or respiratory therapist will set it to a desired goal.  If possible, sit up straight or lean slightly forward. Try not to slouch.  Hold the incentive spirometer in an upright position. INSTRUCTIONS FOR USE  1. Sit on the edge of your bed if possible, or sit up as far as you can in bed or on a chair. 2. Hold the incentive spirometer in an upright position. 3. Breathe out normally. 4. Place the mouthpiece in your mouth and seal your lips tightly around it. 5. Breathe in slowly and as deeply as possible, raising the piston or the ball toward the top of the column. 6. Hold your breath for 3-5 seconds or for as long as possible. Allow the piston or ball to fall to the bottom of the column. 7. Remove the mouthpiece from your mouth and breathe out normally. 8. Rest for a few seconds and repeat Steps 1 through 7 at least 10 times every 1-2 hours when you are awake. Take your time and take a few normal breaths between deep breaths. 9. The spirometer may include an indicator to show your best effort. Use the indicator as a goal to work toward during each repetition. 10. After each set of 10 deep breaths, practice coughing to be sure your lungs are clear. If you have an incision (the cut made at the time of surgery), support your incision when coughing by placing a pillow or rolled up towels firmly against it. Once you are able to get out of bed, walk around indoors and cough well. You may stop using the incentive spirometer when instructed by your caregiver.  RISKS AND COMPLICATIONS  Take your time so you do not get  dizzy or light-headed.  If you are in pain, you may need to take or ask for pain medication before doing incentive spirometry. It is harder to take a deep breath if you are having pain. AFTER USE  Rest and breathe slowly and easily.  It  can be helpful to keep track of a log of your progress. Your caregiver can provide you with a simple table to help with this. If you are using the spirometer at home, follow these instructions: [place] IF:   You are having difficultly using the spirometer.  You have trouble using the spirometer as often as instructed.  Your pain medication is not giving enough relief while using the spirometer.  You develop fever of 100.5 F (38.1 C) or higher. SEEK IMMEDIATE MEDICAL CARE IF:   You cough up bloody sputum that had not been present before.  You develop fever of 102 F (38.9 C) or greater.  You develop worsening pain at or near the incision site. MAKE SURE YOU:   Understand these instructions.  Will watch your condition.  Will get help right away if you are not doing well or get worse. Document Released: 01/31/2007 Document Revised: 12/13/2011 Document Reviewed: 04/03/2007 ExitCare Patient Information 2014 ExitCare, [place].   ________________________________________________________________________  WHAT IS A BLOOD TRANSFUSION? Blood Transfusion Information  A transfusion is the replacement of blood or some of its parts. Blood is made up of multiple cells which provide different functions.  Red blood cells carry oxygen and are used for blood loss replacement.  White blood cells fight against infection.  Platelets control bleeding.  Plasma helps clot blood.  Other blood products are available for specialized needs, such as hemophilia or other clotting disorders. BEFORE THE TRANSFUSION  Who gives blood for transfusions?   Healthy volunteers who are fully evaluated to make sure their blood is safe. This is blood bank blood. Transfusion therapy is the safest it has ever been in the practice of medicine. Before blood is taken from a donor, a complete history is taken to make sure that person has no history of diseases nor engages in risky social behavior (examples  are intravenous drug use or sexual activity with multiple partners). The donor's travel history is screened to minimize risk of transmitting infections, such as malaria. The donated blood is tested for signs of infectious diseases, such as HIV and hepatitis. The blood is then tested to be sure it is compatible with you in order to minimize the chance of a transfusion reaction. If you or a relative donates blood, this is often done in anticipation of surgery and is not appropriate for emergency situations. It takes many days to process the donated blood. RISKS AND COMPLICATIONS Although transfusion therapy is very safe and saves many lives, the main dangers of transfusion include:   Getting an infectious disease.  Developing a transfusion reaction. This is an allergic reaction to something in the blood you were given. Every precaution is taken to prevent this. The decision to have a blood transfusion has been considered carefully by your caregiver before blood is given. Blood is not given unless the benefits outweigh the risks. AFTER THE TRANSFUSION  Right after receiving a blood transfusion, you will usually feel much better and more energetic. This is especially true if your red blood cells have gotten low (anemic). The transfusion raises the level of the red blood cells which carry oxygen, and this usually causes an energy increase.  The nurse administering the transfusion will  monitor you carefully for complications. HOME CARE INSTRUCTIONS  No special instructions are needed after a transfusion. You may find your energy is better. Speak with your caregiver about any limitations on activity for underlying diseases you may have. SEEK MEDICAL CARE IF:   Your condition is not improving after your transfusion.  You develop redness or irritation at the intravenous (IV) site. SEEK IMMEDIATE MEDICAL CARE IF:  Any of the following symptoms occur over the next 12 hours:  Shaking chills.  You have a  temperature by mouth above 102 F (38.9 C), not controlled by medicine.  Chest, back, or muscle pain.  People around you feel you are not acting correctly or are confused.  Shortness of breath or difficulty breathing.  Dizziness and fainting.  You get a rash or develop hives.  You have a decrease in urine output.  Your urine turns a dark color or changes to pink, red, or brown. Any of the following symptoms occur over the next 10 days:  You have a temperature by mouth above 102 F (38.9 C), not controlled by medicine.  Shortness of breath.  Weakness after normal activity.  The white part of the eye turns yellow (jaundice).  You have a decrease in the amount of urine or are urinating less often.  Your urine turns a dark color or changes to pink, red, or brown. Document Released: 09/17/2000 Document Revised: 12/13/2011 Document Reviewed: 05/06/2008 [facility] Patient Information 2014 [place], [place].  _______________________________________________________________________

## 2017-05-17 NOTE — Progress Notes (Signed)
05-10-17  (EPIC) EKG, LOV, Cardiac and surgical clearance on chart from Dr. Bronson Ing    11-15-16 (EPIC) ECHO, EF 60-65%, moderate aortic valve sclerosis with no significant stenosis

## 2017-05-18 ENCOUNTER — Encounter (HOSPITAL_COMMUNITY)
Admission: RE | Admit: 2017-05-18 | Discharge: 2017-05-18 | Disposition: A | Discharge status: Home / Self Care | Payer: Medicare Other | Source: Ambulatory Visit | Attending: Orthopedic Surgery | Admitting: Orthopedic Surgery

## 2017-05-18 ENCOUNTER — Encounter (HOSPITAL_COMMUNITY): Payer: Self-pay

## 2017-05-18 DIAGNOSIS — Z01818 Encounter for other preprocedural examination: Secondary | ICD-10-CM | POA: Diagnosis present

## 2017-05-18 DIAGNOSIS — M1711 Unilateral primary osteoarthritis, right knee: Secondary | ICD-10-CM | POA: Diagnosis not present

## 2017-05-18 LAB — BASIC METABOLIC PANEL
Anion gap: 7 (ref 5–15)
BUN: 31 mg/dL — AB (ref 6–20)
CHLORIDE: 106 mmol/L (ref 101–111)
CO2: 28 mmol/L (ref 22–32)
Calcium: 9.2 mg/dL (ref 8.9–10.3)
Creatinine, Ser: 1.53 mg/dL — ABNORMAL HIGH (ref 0.61–1.24)
GFR calc Af Amer: 43 mL/min — ABNORMAL LOW (ref >60)
GFR calc non Af Amer: 37 mL/min — ABNORMAL LOW (ref >60)
Glucose, Bld: 110 mg/dL — ABNORMAL HIGH (ref 65–99)
POTASSIUM: 5 mmol/L (ref 3.5–5.1)
SODIUM: 141 mmol/L (ref 135–145)

## 2017-05-18 LAB — SURGICAL PCR SCREEN
MRSA, PCR: NEGATIVE
STAPHYLOCOCCUS AUREUS: NEGATIVE

## 2017-05-18 LAB — ABO/RH: ABO/RH(D): A POS

## 2017-05-18 NOTE — Progress Notes (Signed)
05-11-17 Labs on chart that family brought to Pre-op Appt. (CMP w/elevated BUN at 29. CBC (WNL), PSA, Throxine Free, , TSH.

## 2017-05-18 NOTE — Progress Notes (Signed)
05-18-17 BMP result, routed to Dr. Alvan Dame for review.

## 2017-05-21 NOTE — H&P (Signed)
TOTAL HIP ADMISSION H&P  Patient is admitted for right total hip arthroplasty, anterior approach.  Subjective:  Chief Complaint:     Right hip primary OA / pain  HPI: Nathan Kim, 83 y.o. male, has a history of pain and functional disability in the right hip(s) due to arthritis and patient has failed non-surgical conservative treatments for greater than 12 weeks to include NSAID's and/or analgesics, use of assistive devices and activity modification.  Onset of symptoms was abrupt starting ~1 years ago with gradually worsening course since that time.The patient noted no past surgery on the right hip(s).  Patient currently rates pain in the right hip at 6 out of 10 with activity. Patient has night pain, worsening of pain with activity and weight bearing, trendelenberg gait, pain that interfers with activities of daily living and pain with passive range of motion. Patient has evidence of periarticular osteophytes and joint space narrowing by imaging studies. This condition presents safety issues increasing the risk of falls.   There is no current active infection.  Risks, benefits and expectations were discussed with the patient.  Risks including but not limited to the risk of anesthesia, blood clots, nerve damage, blood vessel damage, failure of the prosthesis, infection and up to and including death.  Patient understand the risks, benefits and expectations and wishes to proceed with surgery.   PCP: [name], MD  D/C Plans:       Home   Post-op Meds:       No Rx given  Tranexamic Acid:      To be given - IV  Decadron:      Is to be given  FYI:     ASA  Norco  DME:   Pt already has equipment   PT:   No PT   Patient Active Problem List   Diagnosis Date Noted  . Lumbar stenosis with neurogenic claudication 02/27/2016  . PAF (paroxysmal atrial fibrillation) ([place]) 02/25/2016  . Essential hypertension 02/25/2016  . Preoperative clearance 02/25/2016  . PVC's (premature ventricular  contractions) 02/25/2016  . First degree heart block 02/25/2016   Past Medical History:  Diagnosis Date  . Arthritis   . BPH (benign prostatic hyperplasia)   . Hypertension   . Phlebitis 10/2016    Past Surgical History:  Procedure Laterality Date  . BACK SURGERY    . carpel tunnel    . EYE SURGERY    . HERNIA REPAIR    . LUMBAR LAMINECTOMY/DECOMPRESSION MICRODISCECTOMY N/A 02/27/2016   Procedure: LUMBAR LAMINECTOMY/DECOMPRESSION MICRODISCECTOMY 3 LEVELS;  Surgeon: Kristeen Miss, MD;  Location: [place] [facility];  Service: Neurosurgery;  Laterality: N/A;  L2-3 L3-4 L4-5 Laminectomy    No prescriptions prior to admission.   No Known Allergies   Social History  Substance Use Topics  . Smoking status: Former Smoker    Packs/day: 0.50    Years: 50.00    Types: Cigarettes    Start date: 07/03/1929    Quit date: 07/04/1979  . Smokeless tobacco: Never Used  . Alcohol use No       Review of Systems  Constitutional: Negative.   HENT: Negative.   Eyes: Negative.   Respiratory: Negative.   Cardiovascular: Negative.   Gastrointestinal: Negative.   Genitourinary: Positive for urgency.  Musculoskeletal: Positive for joint pain.  Skin: Negative.   Neurological: Negative.   Endo/Heme/Allergies: Negative.   Psychiatric/Behavioral: Negative.     Objective:  Physical Exam  Constitutional: He is oriented to person, place, and time. He  appears well-developed.  HENT:  Head: Normocephalic.  Eyes: Pupils are equal, round, and reactive to light.  Neck: Neck supple. No JVD present. No tracheal deviation present. No thyromegaly present.  Cardiovascular: Normal rate, regular rhythm and intact distal pulses.   Respiratory: Effort normal and breath sounds normal. No respiratory distress. He has no wheezes.  GI: Soft. There is no tenderness. There is no guarding.  Musculoskeletal:       Right hip: He exhibits decreased range of motion, decreased strength, tenderness and bony tenderness. He  exhibits no swelling, no deformity and no laceration.  Lymphadenopathy:    He has no cervical adenopathy.  Neurological: He is alert and oriented to person, place, and time.  Skin: Skin is warm and dry.  Psychiatric: He has a normal mood and affect.      Labs:  Estimated body mass index is 22.66 kg/m as calculated from the following:   Height as of 05/18/17: 5\' 11"  (1.803 m).   Weight as of 05/18/17: 73.7 kg (162 lb 8 oz).   Imaging Review Plain radiographs demonstrate severe degenerative joint disease of the right hip(s). The bone quality appears to be good for age and reported activity level.  Assessment/Plan:  End stage arthritis, right hip(s)  The patient history, physical examination, clinical judgement of the provider and imaging studies are consistent with end stage degenerative joint disease of the right hip(s) and total hip arthroplasty is deemed medically necessary. The treatment options including medical management, injection therapy, arthroscopy and arthroplasty were discussed at length. The risks and benefits of total hip arthroplasty were presented and reviewed. The risks due to aseptic loosening, infection, stiffness, dislocation/subluxation,  thromboembolic complications and other imponderables were discussed.  The patient acknowledged the explanation, agreed to proceed with the plan and consent was signed. Patient is being admitted for inpatient treatment for surgery, pain control, PT, OT, prophylactic antibiotics, VTE prophylaxis, progressive ambulation and ADL's and discharge planning.The patient is planning to be discharged home.    [place] P[place] Talik Casique   PA-C  05/21/2017, 11:17 AM

## 2017-05-24 ENCOUNTER — Inpatient Hospital Stay (HOSPITAL_COMMUNITY): Payer: Medicare Other | Admitting: Registered Nurse

## 2017-05-24 ENCOUNTER — Inpatient Hospital Stay (HOSPITAL_COMMUNITY): Payer: Medicare Other

## 2017-05-24 ENCOUNTER — Inpatient Hospital Stay (HOSPITAL_COMMUNITY)
Admission: RE | Admit: 2017-05-24 | Discharge: 2017-05-26 | DRG: 470 | Disposition: A | Discharge status: Home / Self Care | Payer: Medicare Other | Source: Ambulatory Visit | Attending: Orthopedic Surgery | Admitting: Orthopedic Surgery

## 2017-05-24 ENCOUNTER — Encounter (HOSPITAL_COMMUNITY)
Admission: RE | Disposition: A | Discharge status: Home / Self Care | Payer: Self-pay | Source: Ambulatory Visit | Attending: Orthopedic Surgery

## 2017-05-24 ENCOUNTER — Encounter (HOSPITAL_COMMUNITY): Payer: Self-pay

## 2017-05-24 DIAGNOSIS — Z87891 Personal history of nicotine dependence: Secondary | ICD-10-CM | POA: Diagnosis not present

## 2017-05-24 DIAGNOSIS — Z419 Encounter for procedure for purposes other than remedying health state, unspecified: Secondary | ICD-10-CM

## 2017-05-24 DIAGNOSIS — I480 Paroxysmal atrial fibrillation: Secondary | ICD-10-CM | POA: Diagnosis not present

## 2017-05-24 DIAGNOSIS — M1611 Unilateral primary osteoarthritis, right hip: Principal | ICD-10-CM | POA: Diagnosis present

## 2017-05-24 DIAGNOSIS — N400 Enlarged prostate without lower urinary tract symptoms: Secondary | ICD-10-CM | POA: Diagnosis not present

## 2017-05-24 DIAGNOSIS — Z8672 Personal history of thrombophlebitis: Secondary | ICD-10-CM | POA: Diagnosis not present

## 2017-05-24 DIAGNOSIS — I10 Essential (primary) hypertension: Secondary | ICD-10-CM | POA: Diagnosis not present

## 2017-05-24 DIAGNOSIS — Z96649 Presence of unspecified artificial hip joint: Secondary | ICD-10-CM

## 2017-05-24 DIAGNOSIS — M48062 Spinal stenosis, lumbar region with neurogenic claudication: Secondary | ICD-10-CM | POA: Diagnosis present

## 2017-05-24 DIAGNOSIS — Z96641 Presence of right artificial hip joint: Secondary | ICD-10-CM

## 2017-05-24 HISTORY — PX: TOTAL HIP ARTHROPLASTY: SHX124

## 2017-05-24 LAB — TYPE AND SCREEN
ABO/RH(D): A POS
Antibody Screen: NEGATIVE

## 2017-05-24 SURGERY — ARTHROPLASTY, HIP, TOTAL, ANTERIOR APPROACH
Anesthesia: General | Site: Hip | Laterality: Right

## 2017-05-24 MED ORDER — ORAL CARE MOUTH RINSE
15.0000 mL | Freq: Two times a day (BID) | OROMUCOSAL | Status: DC
  Administered 2017-05-24 – 2017-05-25 (×4): 15 mL via OROMUCOSAL

## 2017-05-24 MED ORDER — PROPOFOL 10 MG/ML IV BOLUS
INTRAVENOUS | Status: DC | PRN
  Administered 2017-05-24: 100 mg via INTRAVENOUS

## 2017-05-24 MED ORDER — MAGNESIUM CITRATE PO SOLN
1.0000 | Freq: Once | ORAL | Status: DC | PRN
Start: 2017-05-24 — End: 2017-05-26

## 2017-05-24 MED ORDER — LACTATED RINGERS IV SOLN
INTRAVENOUS | Status: DC
  Administered 2017-05-24: 11:00:00 via INTRAVENOUS
  Administered 2017-05-24: 1000 mL via INTRAVENOUS

## 2017-05-24 MED ORDER — PHENOL 1.4 % MT LIQD
1.0000 | OROMUCOSAL | Status: DC | PRN
  Filled 2017-05-24: qty 177

## 2017-05-24 MED ORDER — FENTANYL CITRATE (PF) 250 MCG/5ML IJ SOLN
INTRAMUSCULAR | Status: AC
  Filled 2017-05-24: qty 5

## 2017-05-24 MED ORDER — ALUM & MAG HYDROXIDE-SIMETH 200-200-20 MG/5ML PO SUSP: 15.0000 mL | ORAL | Status: DC | PRN

## 2017-05-24 MED ORDER — ASPIRIN 81 MG PO CHEW
81.0000 mg | CHEWABLE_TABLET | Freq: Two times a day (BID) | ORAL | Status: DC
  Administered 2017-05-24 – 2017-05-26 (×4): 81 mg via ORAL
  Filled 2017-05-24 (×4): qty 1

## 2017-05-24 MED ORDER — CELECOXIB 200 MG PO CAPS
200.0000 mg | ORAL_CAPSULE | Freq: Two times a day (BID) | ORAL | Status: DC
  Administered 2017-05-24 – 2017-05-26 (×4): 200 mg via ORAL
  Filled 2017-05-24 (×4): qty 1

## 2017-05-24 MED ORDER — ONDANSETRON HCL 4 MG/2ML IJ SOLN
INTRAMUSCULAR | Status: DC | PRN
  Administered 2017-05-24: 4 mg via INTRAVENOUS

## 2017-05-24 MED ORDER — ROCURONIUM BROMIDE 50 MG/5ML IV SOSY
PREFILLED_SYRINGE | INTRAVENOUS | Status: AC
Start: 2017-05-24 — End: 2017-05-24
  Filled 2017-05-24: qty 5

## 2017-05-24 MED ORDER — MENTHOL 3 MG MT LOZG: 1.0000 | LOZENGE | OROMUCOSAL | Status: DC | PRN

## 2017-05-24 MED ORDER — CHLORHEXIDINE GLUCONATE 4 % EX LIQD: 60.0000 mL | Freq: Once | CUTANEOUS | Status: DC

## 2017-05-24 MED ORDER — FENTANYL CITRATE (PF) 100 MCG/2ML IJ SOLN
INTRAMUSCULAR | Status: AC
  Filled 2017-05-24: qty 2

## 2017-05-24 MED ORDER — ONDANSETRON HCL 4 MG/2ML IJ SOLN: 4.0000 mg | Freq: Four times a day (QID) | INTRAMUSCULAR | Status: DC | PRN

## 2017-05-24 MED ORDER — HYDROCODONE-ACETAMINOPHEN 7.5-325 MG PO TABS
1.0000 | ORAL_TABLET | ORAL | Status: DC
  Administered 2017-05-24 – 2017-05-26 (×9): 1 via ORAL
  Filled 2017-05-24 (×10): qty 1

## 2017-05-24 MED ORDER — METOCLOPRAMIDE HCL 5 MG/ML IJ SOLN: 5.0000 mg | Freq: Three times a day (TID) | INTRAMUSCULAR | Status: DC | PRN

## 2017-05-24 MED ORDER — SODIUM CHLORIDE 0.9 % IV SOLN
INTRAVENOUS | Status: DC
  Administered 2017-05-24 – 2017-05-25 (×2): via INTRAVENOUS

## 2017-05-24 MED ORDER — CEFAZOLIN SODIUM-DEXTROSE 2-4 GM/100ML-% IV SOLN
INTRAVENOUS | Status: AC
  Filled 2017-05-24: qty 100

## 2017-05-24 MED ORDER — DEXAMETHASONE SODIUM PHOSPHATE 10 MG/ML IJ SOLN
10.0000 mg | Freq: Once | INTRAMUSCULAR | Status: AC
  Administered 2017-05-25: 08:00:00 10 mg via INTRAVENOUS
  Filled 2017-05-24: qty 1

## 2017-05-24 MED ORDER — DEXAMETHASONE SODIUM PHOSPHATE 10 MG/ML IJ SOLN
INTRAMUSCULAR | Status: AC
  Filled 2017-05-24: qty 1

## 2017-05-24 MED ORDER — FENTANYL CITRATE (PF) 100 MCG/2ML IJ SOLN
25.0000 ug | INTRAMUSCULAR | Status: DC | PRN
  Administered 2017-05-24 (×4): 25 ug via INTRAVENOUS

## 2017-05-24 MED ORDER — TRANEXAMIC ACID 1000 MG/10ML IV SOLN
1000.0000 mg | Freq: Once | INTRAVENOUS | Status: AC
  Administered 2017-05-24: 1000 mg via INTRAVENOUS
  Filled 2017-05-24: qty 1100

## 2017-05-24 MED ORDER — METHOCARBAMOL 500 MG PO TABS
500.0000 mg | ORAL_TABLET | Freq: Four times a day (QID) | ORAL | Status: DC | PRN
  Administered 2017-05-25: 04:00:00 500 mg via ORAL
  Filled 2017-05-24: qty 1

## 2017-05-24 MED ORDER — AMLODIPINE BESYLATE 5 MG PO TABS
2.5000 mg | ORAL_TABLET | Freq: Every evening | ORAL | Status: DC
  Administered 2017-05-24 – 2017-05-25 (×2): 2.5 mg via ORAL
  Filled 2017-05-24 (×2): qty 1

## 2017-05-24 MED ORDER — SODIUM CHLORIDE 0.9 % IV SOLN
1000.0000 mg | INTRAVENOUS | Status: AC
  Administered 2017-05-24: 1000 mg via INTRAVENOUS
  Filled 2017-05-24: qty 1100

## 2017-05-24 MED ORDER — FERROUS SULFATE 325 (65 FE) MG PO TABS: 325.0000 mg | ORAL_TABLET | Freq: Three times a day (TID) | ORAL | 3 refills | Status: DC

## 2017-05-24 MED ORDER — FERROUS SULFATE 325 (65 FE) MG PO TABS
325.0000 mg | ORAL_TABLET | Freq: Three times a day (TID) | ORAL | Status: DC
  Administered 2017-05-25 – 2017-05-26 (×4): 325 mg via ORAL
  Filled 2017-05-24 (×4): qty 1

## 2017-05-24 MED ORDER — TAMSULOSIN HCL 0.4 MG PO CAPS
0.4000 mg | ORAL_CAPSULE | Freq: Every day | ORAL | Status: DC
  Administered 2017-05-25 – 2017-05-26 (×2): 0.4 mg via ORAL
  Filled 2017-05-24 (×2): qty 1

## 2017-05-24 MED ORDER — ROCURONIUM BROMIDE 100 MG/10ML IV SOLN
INTRAVENOUS | Status: DC | PRN
  Administered 2017-05-24: 50 mg via INTRAVENOUS

## 2017-05-24 MED ORDER — ALBUMIN HUMAN 5 % IV SOLN
INTRAVENOUS | Status: DC | PRN
  Administered 2017-05-24 (×2): via INTRAVENOUS

## 2017-05-24 MED ORDER — HYDROCODONE-ACETAMINOPHEN 7.5-325 MG PO TABS: 1.0000 | ORAL_TABLET | ORAL | 0 refills | Status: DC | PRN

## 2017-05-24 MED ORDER — PROPOFOL 10 MG/ML IV BOLUS
INTRAVENOUS | Status: AC
  Filled 2017-05-24: qty 20

## 2017-05-24 MED ORDER — ALBUMIN HUMAN 5 % IV SOLN
INTRAVENOUS | Status: AC
  Filled 2017-05-24: qty 250

## 2017-05-24 MED ORDER — METHOCARBAMOL 1000 MG/10ML IJ SOLN
500.0000 mg | Freq: Four times a day (QID) | INTRAMUSCULAR | Status: DC | PRN
  Administered 2017-05-24: 500 mg via INTRAVENOUS
  Filled 2017-05-24: qty 550

## 2017-05-24 MED ORDER — FENTANYL CITRATE (PF) 100 MCG/2ML IJ SOLN
25.0000 ug | INTRAMUSCULAR | Status: DC | PRN
  Administered 2017-05-25: 25 ug via INTRAVENOUS
  Filled 2017-05-24: qty 2

## 2017-05-24 MED ORDER — EPHEDRINE 5 MG/ML INJ
INTRAVENOUS | Status: AC
  Filled 2017-05-24: qty 10

## 2017-05-24 MED ORDER — FENTANYL CITRATE (PF) 100 MCG/2ML IJ SOLN
INTRAMUSCULAR | Status: DC | PRN
  Administered 2017-05-24 (×5): 50 ug via INTRAVENOUS

## 2017-05-24 MED ORDER — PROPOFOL 10 MG/ML IV BOLUS
INTRAVENOUS | Status: AC
  Filled 2017-05-24: qty 40

## 2017-05-24 MED ORDER — DOCUSATE SODIUM 100 MG PO CAPS
100.0000 mg | ORAL_CAPSULE | Freq: Two times a day (BID) | ORAL | Status: DC
  Administered 2017-05-24 – 2017-05-26 (×4): 100 mg via ORAL
  Filled 2017-05-24 (×4): qty 1

## 2017-05-24 MED ORDER — DIPHENHYDRAMINE HCL 25 MG PO CAPS: 25.0000 mg | ORAL_CAPSULE | Freq: Four times a day (QID) | ORAL | Status: DC | PRN

## 2017-05-24 MED ORDER — CEFAZOLIN SODIUM-DEXTROSE 2-4 GM/100ML-% IV SOLN
2.0000 g | INTRAVENOUS | Status: AC
  Administered 2017-05-24: 2 g via INTRAVENOUS

## 2017-05-24 MED ORDER — BISACODYL 10 MG RE SUPP: 10.0000 mg | Freq: Every day | RECTAL | Status: DC | PRN

## 2017-05-24 MED ORDER — ASPIRIN 81 MG PO CHEW: 81.0000 mg | CHEWABLE_TABLET | Freq: Two times a day (BID) | ORAL | 0 refills | Status: AC

## 2017-05-24 MED ORDER — POLYETHYLENE GLYCOL 3350 17 G PO PACK
17.0000 g | PACK | Freq: Two times a day (BID) | ORAL | Status: DC
  Administered 2017-05-24 – 2017-05-25 (×3): 17 g via ORAL
  Filled 2017-05-24 (×3): qty 1

## 2017-05-24 MED ORDER — CEFAZOLIN SODIUM-DEXTROSE 2-4 GM/100ML-% IV SOLN
2.0000 g | Freq: Four times a day (QID) | INTRAVENOUS | Status: AC
  Administered 2017-05-24 (×2): 2 g via INTRAVENOUS
  Filled 2017-05-24 (×2): qty 100

## 2017-05-24 MED ORDER — SUGAMMADEX SODIUM 200 MG/2ML IV SOLN
INTRAVENOUS | Status: DC | PRN
  Administered 2017-05-24: 200 mg via INTRAVENOUS

## 2017-05-24 MED ORDER — FUROSEMIDE 20 MG PO TABS
20.0000 mg | ORAL_TABLET | ORAL | Status: DC
  Administered 2017-05-25: 20 mg via ORAL
  Filled 2017-05-24: qty 1

## 2017-05-24 MED ORDER — METOPROLOL TARTRATE 12.5 MG HALF TABLET
12.5000 mg | ORAL_TABLET | Freq: Two times a day (BID) | ORAL | Status: DC
  Administered 2017-05-24 – 2017-05-26 (×4): 12.5 mg via ORAL
  Filled 2017-05-24 (×4): qty 1

## 2017-05-24 MED ORDER — LIDOCAINE 2% (20 MG/ML) 5 ML SYRINGE
INTRAMUSCULAR | Status: AC
  Filled 2017-05-24: qty 5

## 2017-05-24 MED ORDER — ONDANSETRON HCL 4 MG PO TABS: 4.0000 mg | ORAL_TABLET | Freq: Four times a day (QID) | ORAL | Status: DC | PRN

## 2017-05-24 MED ORDER — METHOCARBAMOL 500 MG PO TABS: 500.0000 mg | ORAL_TABLET | Freq: Four times a day (QID) | ORAL | 0 refills | Status: DC | PRN

## 2017-05-24 MED ORDER — METOCLOPRAMIDE HCL 5 MG PO TABS: 5.0000 mg | ORAL_TABLET | Freq: Three times a day (TID) | ORAL | Status: DC | PRN

## 2017-05-24 MED ORDER — DOCUSATE SODIUM 100 MG PO CAPS: 100.0000 mg | ORAL_CAPSULE | Freq: Two times a day (BID) | ORAL | 0 refills | Status: DC

## 2017-05-24 MED ORDER — SUGAMMADEX SODIUM 200 MG/2ML IV SOLN
INTRAVENOUS | Status: AC
  Filled 2017-05-24: qty 2

## 2017-05-24 MED ORDER — DEXAMETHASONE SODIUM PHOSPHATE 10 MG/ML IJ SOLN
10.0000 mg | Freq: Once | INTRAMUSCULAR | Status: AC
  Administered 2017-05-24: 10 mg via INTRAVENOUS

## 2017-05-24 MED ORDER — SODIUM CHLORIDE 0.9 % IR SOLN
Status: DC | PRN
  Administered 2017-05-24: 1000 mL

## 2017-05-24 MED ORDER — POLYETHYLENE GLYCOL 3350 17 G PO PACK: 17.0000 g | PACK | Freq: Two times a day (BID) | ORAL | 0 refills | Status: DC

## 2017-05-24 SURGICAL SUPPLY — 38 items
ADH SKN CLS APL DERMABOND .7 (GAUZE/BANDAGES/DRESSINGS) ×1
BAG DECANTER FOR FLEXI CONT (MISCELLANEOUS) IMPLANT
BAG SPEC THK2 15X12 ZIP CLS (MISCELLANEOUS)
BAG ZIPLOCK 12X15 (MISCELLANEOUS) IMPLANT
BLADE SAG 18X100X1.27 (BLADE) ×3 IMPLANT
CAPT HIP TOTAL 2 ×2 IMPLANT
CLOTH BEACON ORANGE TIMEOUT ST (SAFETY) ×3 IMPLANT
COVER PERINEAL POST (MISCELLANEOUS) ×3 IMPLANT
COVER SURGICAL LIGHT HANDLE (MISCELLANEOUS) ×3 IMPLANT
DERMABOND ADVANCED (GAUZE/BANDAGES/DRESSINGS) ×2
DERMABOND ADVANCED .7 DNX12 (GAUZE/BANDAGES/DRESSINGS) ×1 IMPLANT
DRAPE STERI IOBAN 125X83 (DRAPES) ×3 IMPLANT
DRAPE U-SHAPE 47X51 STRL (DRAPES) ×6 IMPLANT
DRESSING AQUACEL AG SP 3.5X10 (GAUZE/BANDAGES/DRESSINGS) ×1 IMPLANT
DRSG AQUACEL AG ADV 3.5X10 (GAUZE/BANDAGES/DRESSINGS) ×2 IMPLANT
DRSG AQUACEL AG SP 3.5X10 (GAUZE/BANDAGES/DRESSINGS) ×3
DURAPREP 26ML APPLICATOR (WOUND CARE) ×3 IMPLANT
ELECT REM PT RETURN 15FT ADLT (MISCELLANEOUS) ×3 IMPLANT
GLOVE BIOGEL M STRL SZ7.5 (GLOVE) IMPLANT
GLOVE BIOGEL PI IND STRL 7.5 (GLOVE) ×1 IMPLANT
GLOVE BIOGEL PI IND STRL 8.5 (GLOVE) ×1 IMPLANT
GLOVE BIOGEL PI INDICATOR 7.5 (GLOVE) ×2
GLOVE BIOGEL PI INDICATOR 8.5 (GLOVE) ×2
GLOVE ECLIPSE 8.0 STRL XLNG CF (GLOVE) ×6 IMPLANT
GLOVE ORTHO TXT STRL SZ7.5 (GLOVE) ×3 IMPLANT
GOWN STRL REUS W/TWL LRG LVL3 (GOWN DISPOSABLE) ×3 IMPLANT
GOWN STRL REUS W/TWL XL LVL3 (GOWN DISPOSABLE) ×3 IMPLANT
HOLDER FOLEY CATH W/STRAP (MISCELLANEOUS) ×3 IMPLANT
PACK ANTERIOR HIP CUSTOM (KITS) ×3 IMPLANT
SUT MNCRL AB 4-0 PS2 18 (SUTURE) ×3 IMPLANT
SUT STRATAFIX 0 PDS 27 VIOLET (SUTURE) ×3
SUT VIC AB 1 CT1 36 (SUTURE) ×9 IMPLANT
SUT VIC AB 2-0 CT1 27 (SUTURE) ×6
SUT VIC AB 2-0 CT1 TAPERPNT 27 (SUTURE) ×2 IMPLANT
SUTURE STRATFX 0 PDS 27 VIOLET (SUTURE) ×1 IMPLANT
TRAY FOLEY W/METER SILVER 16FR (SET/KITS/TRAYS/PACK) ×2 IMPLANT
WATER STERILE IRR 1500ML POUR (IV SOLUTION) ×5 IMPLANT
YANKAUER SUCT BULB TIP 10FT TU (MISCELLANEOUS) ×2 IMPLANT

## 2017-05-24 NOTE — Anesthesia Postprocedure Evaluation (Signed)
Anesthesia Post Note  Patient: Nathan Kim  Procedure(s) Performed: Procedure(s) (LRB): RIGHT TOTAL HIP ARTHROPLASTY ANTERIOR APPROACH (Right)     Patient location during evaluation: PACU Anesthesia Type: General Level of consciousness: awake and alert Pain management: pain level controlled Vital Signs Assessment: post-procedure vital signs reviewed and stable Respiratory status: spontaneous breathing, nonlabored ventilation, respiratory function stable and patient connected to nasal cannula oxygen Cardiovascular status: blood pressure returned to baseline and stable Postop Assessment: no signs of nausea or vomiting Anesthetic complications: no    Last Vitals:  Vitals:   05/24/17 1330 05/24/17 1335  BP: 127/72   Pulse: 73 73  Resp: 17 13  Temp: 36.4 C   SpO2: 100% 99%    Last Pain:  Vitals:   05/24/17 1335  TempSrc:   PainSc: 4                  Tiajuana Amass

## 2017-05-24 NOTE — Anesthesia Preprocedure Evaluation (Addendum)
Anesthesia Evaluation  Patient identified by MRN, date of birth, ID band Patient awake    Reviewed: Allergy & Precautions, NPO status , Patient's Chart, lab work & pertinent test results, reviewed documented beta blocker date and time   Airway Mallampati: II  TM Distance: >3 FB Neck ROM: Limited    Dental  (+) Dental Advisory Given   Pulmonary former smoker,    Pulmonary exam normal breath sounds clear to auscultation       Cardiovascular hypertension, Pt. on medications and Pt. on home beta blockers Normal cardiovascular exam+ dysrhythmias Atrial Fibrillation  Rhythm:Regular Rate:Normal     Neuro/Psych negative neurological ROS     GI/Hepatic negative GI ROS, Neg liver ROS,   Endo/Other  negative endocrine ROS  Renal/GU negative Renal ROS     Musculoskeletal  (+) Arthritis ,   Abdominal   Peds  Hematology negative hematology ROS (+)   Anesthesia Other Findings   Reproductive/Obstetrics                            Lab Results  Component Value Date   WBC 7.8 02/19/2016   HGB 13.3 02/19/2016   HCT 41.3 02/19/2016   MCV 91.8 02/19/2016   PLT 217 02/19/2016   Lab Results  Component Value Date   CREATININE 1.53 (H) 05/18/2017   BUN 31 (H) 05/18/2017   NA 141 05/18/2017   K 5.0 05/18/2017   CL 106 05/18/2017   CO2 28 05/18/2017   No results found for: INR, PROTIME  Anesthesia Physical Anesthesia Plan  ASA: III  Anesthesia Plan: General   Post-op Pain Management:    Induction: Intravenous  PONV Risk Score and Plan: 3 and Ondansetron, Dexamethasone and Treatment may vary due to age or medical condition  Airway Management Planned: Oral ETT  Additional Equipment:   Intra-op Plan:   Post-operative Plan: Extubation in OR  Informed Consent: I have reviewed the patients History and Physical, chart, labs and discussed the procedure including the risks, benefits and  alternatives for the proposed anesthesia with the patient or authorized representative who has indicated his/her understanding and acceptance.   Dental advisory given  Plan Discussed with: CRNA  Anesthesia Plan Comments: (Pt refuses spinal 2/2 spinal stenosis and recent lumbar laminectomy and microdiscectomy.)       Anesthesia Quick Evaluation

## 2017-05-24 NOTE — Evaluation (Signed)
Physical Therapy Evaluation Patient Details Name: Nathan Kim MRN: [id number] DOB: Jan 25, 1920 Today's Date: 05/24/2017   History of Present Illness  83 yo male . S/P R direct anterior THA  Clinical Impression  The patient tolerated mobilizing to the recliner taking a few steps with the RW and 2 assist. Pt admitted with above diagnosis. Pt currently with functional limitations due to the deficits listed below (see PT Problem List).  Pt will benefit from skilled PT to increase their independence and safety with mobility to allow discharge to the venue listed below.       Follow Up Recommendations Home health PT;Supervision/Assistance - 24 hour    Equipment Recommendations  None recommended by PT    Recommendations for Other Services   ot   Precautions / Restrictions Precautions Precautions: Fall Restrictions Weight Bearing Restrictions: Yes      Mobility  Bed Mobility Overal bed mobility: Needs Assistance Bed Mobility: Supine to Sit     Supine to sit: Mod assist;HOB elevated     General bed mobility comments: assist with right leg and trunk  Transfers Overall transfer level: Needs assistance Equipment used: Rolling walker (2 wheeled) Transfers: Sit to/from Stand Sit to Stand: Mod assist;+2 safety/equipment         General transfer comment: cues for hand and right leg position and hand position, assist to rise and steady  Ambulation/Gait Ambulation/Gait assistance: Mod assist;+2 safety/equipment Ambulation Distance (Feet): 5 Feet Assistive device: Rolling walker (2 wheeled) Gait Pattern/deviations: Step-to pattern;Antalgic     General Gait Details: Steady asssit during ambulation  Stairs            Wheelchair Mobility    Modified Rankin (Stroke Patients Only)       Balance Overall balance assessment: Needs assistance Sitting-balance support: Feet supported;Bilateral upper extremity supported Sitting balance-Leahy Scale: Fair     Standing  balance support: During functional activity;Bilateral upper extremity supported Standing balance-Leahy Scale: Poor                               Pertinent Vitals/Pain Pain Assessment: Faces Faces Pain Scale: Hurts even more Pain Location: right hip Pain Descriptors / Indicators: Aching;Discomfort;Grimacing Pain Intervention(s): Monitored during session;Premedicated before session;Repositioned;Patient requesting pain meds-RN notified;Ice applied    Home Living Family/patient expects to be discharged to:: Private residence Living Arrangements: Spouse/significant other;Other relatives Available Help at Discharge: Family;Available 24 hours/day Type of Home: House Home Access: Stairs to enter Entrance Stairs-Rails: Right;Left;Can reach both Entrance Stairs-Number of Steps: 3 Home Layout: One level Home Equipment: Walker - 2 wheels;Cane - single point Additional Comments: wife unable to assit. Daughter to assist.    Prior Function Level of Independence: Independent with assistive device(s)         Comments: with cane     Hand Dominance        Extremity/Trunk Assessment   Upper Extremity Assessment Upper Extremity Assessment: Defer to OT evaluation    Lower Extremity Assessment Lower Extremity Assessment: RLE deficits/detail RLE Deficits / Details: able to bear weight on the  right leg and advance    Cervical / Trunk Assessment Cervical / Trunk Assessment: Kyphotic  Communication   Communication: HOH;No difficulties  Cognition Arousal/Alertness: Awake/alert Behavior During Therapy: WFL for tasks assessed/performed Overall Cognitive Status: Within Functional Limits for tasks assessed  General Comments      Exercises     Assessment/Plan    PT Assessment Patient needs continued PT services  PT Problem List Decreased strength;Decreased range of motion;Decreased activity tolerance;Decreased  mobility;Decreased balance;Decreased knowledge of precautions;Decreased safety awareness;Decreased knowledge of use of DME       PT Treatment Interventions DME instruction;Gait training;Stair training;Functional mobility training;Therapeutic activities;Therapeutic exercise;Patient/family education    PT Goals (Current goals can be found in the Care Plan section)  Acute Rehab PT Goals Patient Stated Goal: to walk PT Goal Formulation: With patient/family Time For Goal Achievement: 05/31/17 Potential to Achieve Goals: Good    Frequency 7X/week   Barriers to discharge        Co-evaluation               AM-PAC PT "6 Clicks" Daily Activity  Outcome Measure Difficulty turning over in bed (including adjusting bedclothes, sheets and blankets)?: Unable Difficulty moving from lying on back to sitting on the side of the bed? : Unable Difficulty sitting down on and standing up from a chair with arms (e.g., wheelchair, bedside commode, etc,.)?: Unable Help needed moving to and from a bed to chair (including a wheelchair)?: Total Help needed walking in hospital room?: Total Help needed climbing 3-5 steps with a railing? : Total 6 Click Score: 6    End of Session Equipment Utilized During Treatment: Gait belt Activity Tolerance: Patient tolerated treatment well Patient left: in chair;with call bell/phone within reach;with family/visitor present Nurse Communication: Mobility status PT Visit Diagnosis: Difficulty in walking, not elsewhere classified (R26.2);Pain Pain - Right/Left: Right Pain - part of body: Hip    Time: 4975-3005 PT Time Calculation (min) (ACUTE ONLY): 20 min   Charges:   PT Evaluation $PT Eval Low Complexity: 1 Low     PT G CodesTresa Endo PT [phone]  Claretha Cooper 05/24/2017, 5:45 PM

## 2017-05-24 NOTE — Anesthesia Procedure Notes (Addendum)
Procedure Name: Intubation Date/Time: 05/24/2017 10:13 AM Performed by: Lissa Morales Pre-anesthesia Checklist: Patient identified, Emergency Drugs available, Suction available and Patient being monitored Patient Re-evaluated:Patient Re-evaluated prior to induction Oxygen Delivery Method: Circle system utilized Preoxygenation: Pre-oxygenation with 100% oxygen Induction Type: IV induction Ventilation: Mask ventilation without difficulty Laryngoscope Size: Mac and 4 Grade View: Grade II Tube type: Oral Tube size: 8.0 mm Number of attempts: 1 Airway Equipment and Method: Stylet and Oral airway Placement Confirmation: ETT inserted through vocal cords under direct vision,  positive ETCO2 and breath sounds checked- equal and bilateral Secured at: 22 cm Tube secured with: Tape Dental Injury: Teeth and Oropharynx as per pre-operative assessment  Difficulty Due To: Difficult Airway- due to reduced neck mobility Comments: Intubated by paramedic student Colletta [place]. Immobile neck

## 2017-05-24 NOTE — Interval H&P Note (Signed)
History and Physical Interval Note:  05/24/2017 9:02 AM  Nathan Kim  has presented today for surgery, with the diagnosis of Right hip osteoarthritis  The various methods of treatment have been discussed with the patient and family. After consideration of risks, benefits and other options for treatment, the patient has consented to  Procedure(s) with comments: RIGHT TOTAL HIP ARTHROPLASTY ANTERIOR APPROACH (Right) - 70 mins as a surgical intervention .  The patient's history has been reviewed, patient examined, no change in status, stable for surgery.  I have reviewed the patient's chart and labs.  Questions were answered to the patient's satisfaction.     Mauri Pole

## 2017-05-24 NOTE — Op Note (Signed)
NAME:  Nathan Kim                ACCOUNT NO.: [id number]      MEDICAL RECORD NO.: [id number]      FACILITY:  [facility]      PHYSICIAN:  Paralee Cancel D  DATE OF BIRTH:  [birth year]     DATE OF PROCEDURE:  05/24/2017                                 OPERATIVE REPORT         PREOPERATIVE DIAGNOSIS: Right  hip osteoarthritis.      POSTOPERATIVE DIAGNOSIS:  Right hip osteoarthritis.      PROCEDURE:  Right total hip replacement through an anterior approach   utilizing DePuy THR system, component size 33mm pinnacle cup, a size 36+4 neutral   Altrex liner, a size 8 Hi Tri Lock stem with a 36+1.5 delta ceramic   ball.      SURGEON:  Pietro Cassis. Alvan Dame, M.D.      ASSISTANT:  Danae Orleans, PA-C     ANESTHESIA:  Spinal.      SPECIMENS:  None.      COMPLICATIONS:  None.      BLOOD LOSS:  650 cc     DRAINS:  None.      INDICATION OF THE PROCEDURE:  Nathan Kim is a 83 y.o. male who had   presented to office for evaluation of right hip pain.  Radiographs revealed   progressive degenerative changes with bone-on-bone   articulation to the  hip joint.  The patient had painful limited range of   motion significantly affecting their overall quality of life.  The patient was failing to    respond to conservative measures, and at this point was ready   to proceed with more definitive measures.  The patient has noted progressive   degenerative changes in his hip, progressive problems and dysfunction   with regarding the hip prior to surgery.  Consent was obtained for   benefit of pain relief.  Specific risk of infection, DVT, component   failure, dislocation, need for revision surgery, as well discussion of   the anterior versus posterior approach were reviewed.  Consent was   obtained for benefit of anterior pain relief through an anterior   approach.      PROCEDURE IN DETAIL:  The patient was brought to operative theater.   Once adequate anesthesia,  preoperative antibiotics, 2 gm of Ancef, 1 gm of Tranexamic Acid, and 10 mg of Decadron administered.   The patient was positioned supine on the OSI Hanna table.  Once adequate   padding of boney process was carried out, we had predraped out the hip, and  used fluoroscopy to confirm orientation of the pelvis and position.      The right hip was then prepped and draped from proximal iliac crest to   mid thigh with shower curtain technique.      Time-out was performed identifying the patient, planned procedure, and   extremity.     An incision was then made 2 cm distal and lateral to the   anterior superior iliac spine extending over the orientation of the   tensor fascia lata muscle and sharp dissection was carried down to the   fascia of the muscle and protractor placed in the soft tissues.      The fascia was then  incised.  The muscle belly was identified and swept   laterally and retractor placed along the superior neck.  Following   cauterization of the circumflex vessels and removing some pericapsular   fat, a second cobra retractor was placed on the inferior neck.  A third   retractor was placed on the anterior acetabulum after elevating the   anterior rectus.  A L-capsulotomy was along the line of the   superior neck to the trochanteric fossa, then extended proximally and   distally.  Tag sutures were placed and the retractors were then placed   intracapsular.  We then identified the trochanteric fossa and   orientation of my neck cut, confirmed this radiographically   and then made a neck osteotomy with the femur on traction.  The femoral   head was removed without difficulty or complication.  Traction was let   off and retractors were placed posterior and anterior around the   acetabulum.      The labrum and foveal tissue were debrided.  I began reaming with a 22mm   reamer and reamed up to 56mm reamer with good bony bed preparation and a 46mm   cup was chosen.  The final  50mm Pinnacle cup was then impacted under fluoroscopy  to confirm the depth of penetration and orientation with respect to   abduction.  A screw was placed followed by the hole eliminator.  The final   36+4 neutral Altrex liner was impacted with good visualized rim fit.  The cup was positioned anatomically within the acetabular portion of the pelvis.      At this point, the femur was rolled at 80 degrees.  Further capsule was   released off the inferior aspect of the femoral neck.  I then   released the superior capsule proximally.  The hook was placed laterally   along the femur and elevated manually and held in position with the bed   hook.  The leg was then extended and adducted with the leg rolled to 100   degrees of external rotation.  Once the proximal femur was fully   exposed, I used a box osteotome to set orientation.  I then began   broaching with the starting chili pepper broach and passed this by hand and then broached up to 8.  With the 8 broach in place I chose a high offset neck and did several trial reductions with [employer] technology.  The offset was appropriate, leg lengths   appeared to be equal best matched with the +1.5 head ball confirmed radiographically.   Given these findings, I went ahead and dislocated the hip, repositioned all   retractors and positioned the right hip in the extended and abducted position.  The final 8 Hi Tri Lock stem was   chosen and it was impacted down to the level of neck cut.  Based on this   and the trial reduction, a 36+1.5 delta ceramic ball was chosen and   impacted onto a clean and dry trunnion, and the hip was reduced.  The   hip had been irrigated throughout the case again at this point.  I did   reapproximate the superior capsular leaflet to the anterior leaflet   using #1 Vicryl.  The fascia of the   tensor fascia lata muscle was then reapproximated using #1 Vicryl and #0 V-lock sutures.  The   remaining wound was closed with 2-0  Vicryl and running 4-0 Monocryl.   The hip was cleaned,  dried, and dressed sterilely using Dermabond and   Aquacel dressing.  He was then brought   to recovery room in stable condition tolerating the procedure well.    Danae Orleans, PA-C was present for the entirety of the case involved from   preoperative positioning, perioperative retractor management, general   facilitation of the case, as well as primary wound closure as assistant.            Pietro Cassis Alvan Dame, M.D.        05/24/2017 11:53 AM

## 2017-05-24 NOTE — Discharge Instructions (Signed)

## 2017-05-24 NOTE — Progress Notes (Signed)
Portable X-ray report: IMPRESSION: Total right hip replacement with anatomic alignment.

## 2017-05-24 NOTE — Transfer of Care (Signed)
Immediate Anesthesia Transfer of Care Note  Patient: Nathan Kim  Procedure(s) Performed: Procedure(s) with comments: RIGHT TOTAL HIP ARTHROPLASTY ANTERIOR APPROACH (Right) - 70 mins  Patient Location: PACU  Anesthesia Type:General  Level of Consciousness: awake, alert , oriented and patient cooperative  Airway & Oxygen Therapy: Patient Spontanous Breathing and Patient connected to face mask oxygen  Post-op Assessment: Report given to RN, Post -op Vital signs reviewed and stable and Patient moving all extremities  Post vital signs: Reviewed and stable  Last Vitals:  Vitals:   05/24/17 0801  BP: (!) 144/83  Pulse: 76  Resp: 18  Temp: 36.8 C  SpO2: 100%    Last Pain:  Vitals:   05/24/17 0801  TempSrc: Oral      Patients Stated Pain Goal: 4 (19/41/74 0814)  Complications: No apparent anesthesia complications

## 2017-05-25 ENCOUNTER — Ambulatory Visit: Payer: Medicare Other | Admitting: Cardiovascular Disease

## 2017-05-25 LAB — CBC
HEMATOCRIT: 28.9 % — AB (ref 39.0–52.0)
HEMOGLOBIN: 9.7 g/dL — AB (ref 13.0–17.0)
MCH: 30.8 pg (ref 26.0–34.0)
MCHC: 33.6 g/dL (ref 30.0–36.0)
MCV: 91.7 fL (ref 78.0–100.0)
Platelets: 134 10*3/uL — ABNORMAL LOW (ref 150–400)
RBC: 3.15 MIL/uL — ABNORMAL LOW (ref 4.22–5.81)
RDW: 13.4 % (ref 11.5–15.5)
WBC: 9.2 10*3/uL (ref 4.0–10.5)

## 2017-05-25 LAB — BASIC METABOLIC PANEL
Anion gap: 5 (ref 5–15)
BUN: 29 mg/dL — AB (ref 6–20)
CHLORIDE: 107 mmol/L (ref 101–111)
CO2: 25 mmol/L (ref 22–32)
CREATININE: 1.34 mg/dL — AB (ref 0.61–1.24)
Calcium: 8.6 mg/dL — ABNORMAL LOW (ref 8.9–10.3)
GFR calc Af Amer: 51 mL/min — ABNORMAL LOW (ref >60)
GFR, EST NON AFRICAN AMERICAN: 44 mL/min — AB (ref >60)
GLUCOSE: 156 mg/dL — AB (ref 65–99)
Potassium: 4.2 mmol/L (ref 3.5–5.1)
Sodium: 137 mmol/L (ref 135–145)

## 2017-05-25 NOTE — Evaluation (Signed)
Occupational Therapy Evaluation Patient Details Name: Nathan Kim MRN: [id number] DOB: May 17, 1920 Today's Date: 05/25/2017    History of Present Illness 75myo male . S/P R direct anterior THA   Clinical Impression   Pt is s/p THA resulting in the deficits listed below (see OT Problem List). Pt will benefit from skilled OT to increase their safety and independence with ADL and functional mobility for ADL to facilitate discharge to venue listed below.        Follow Up Recommendations  No OT follow up;Supervision/Assistance - 24 hour    Equipment Recommendations  None recommended by OT       Precautions / Restrictions Precautions Precautions: Fall      Mobility Bed Mobility               General bed mobility comments: pt in chair  Transfers Overall transfer level: Needs assistance Equipment used: Rolling walker (2 wheeled) Transfers: Sit to/from [facility] Sit to Stand: Min assist Stand pivot transfers: Min assist       General transfer comment: Vc for hand placement        ADL either performed or assessed with clinical judgement   ADL Overall ADL's : Needs assistance/impaired Eating/Feeding: Set up;Sitting   Grooming: Set up;Sitting   Upper Body Bathing: Set up;Sitting   Lower Body Bathing: Moderate assistance;Sit to/from stand;Cueing for safety;Cueing for sequencing   Upper Body Dressing : Set up;Sitting   Lower Body Dressing: Moderate assistance;Sit to/from stand   Toilet Transfer: Minimal assistance;RW       Tub/ Shower Transfer: Minimal assistance;Cueing for safety;Cueing for sequencing     General ADL Comments: pt will likely need min A at home with ADL activity . Pt aware      Vision Patient Visual Report: No change from baseline       Perception     Praxis      Pertinent Vitals/Pain Pain Assessment: Faces Pain Score: 3  Pain Location: right hip Pain Descriptors / Indicators:  Aching;Discomfort;Grimacing Pain Intervention(s): Limited activity within patient's tolerance;Monitored during session;Ice applied        Extremity/Trunk Assessment Upper Extremity Assessment Upper Extremity Assessment: Overall WFL for tasks assessed           Communication Communication Communication: HOH;No difficulties   Cognition Arousal/Alertness: Awake/alert Behavior During Therapy: WFL for tasks assessed/performed Overall Cognitive Status: Within Functional Limits for tasks assessed                                                Home Living Family/patient expects to be discharged to:: Private residence Living Arrangements: Spouse/significant other;Other relatives Available Help at Discharge: Family;Available 24 hours/day Type of Home: House Home Access: Stairs to enter [employer] of Steps: 3 Entrance Stairs-Rails: Right;Left;Can reach both Home Layout: One level               Home Equipment: Walker - 2 wheels;Cane - single point   Additional Comments: wife unable to assit. Daughter to assist.      Prior Functioning/Environment Level of Independence: Independent with assistive device(s)        Comments: with cane        OT Problem List: Decreased strength;Decreased safety awareness;Impaired balance (sitting and/or standing);Decreased knowledge of use of DME or AE;Decreased activity tolerance      OT Treatment/Interventions: Self-care/ADL training;Patient/family education;DME and/or  AE instruction    OT Goals(Current goals can be found in the care plan section) Acute Rehab OT Goals Patient Stated Goal: to walk OT Goal Formulation: With patient Time For Goal Achievement: 06/01/17 Potential to Achieve Goals: Good  OT Frequency: Min 2X/week   Barriers to D/C:               AM-PAC PT "6 Clicks" Daily Activity     Outcome Measure Help from another person eating meals?: None Help from another person taking care of  personal grooming?: None Help from another person toileting, which includes using toliet, bedpan, or urinal?: A Little Help from another person bathing (including washing, rinsing, drying)?: A Little Help from another person to put on and taking off regular upper body clothing?: A Little Help from another person to put on and taking off regular lower body clothing?: A Little 6 Click Score: 20   End of Session Equipment Utilized During Treatment: Rolling walker Nurse Communication: Mobility status  Activity Tolerance: Patient tolerated treatment well Patient left: in chair;with call bell/phone within reach;with family/visitor present  OT Visit Diagnosis: Unsteadiness on feet (R26.81);Muscle weakness (generalized) (M62.81)                Time: 4114-6431 OT Time Calculation (min): 23 min Charges:  OT General Charges $OT Visit: 1 Procedure OT Evaluation $OT Eval Moderate Complexity: 1 Procedure G-Codes:     Kari Baars, [place]  Payton Mccallum D 05/25/2017, 12:15 PM

## 2017-05-25 NOTE — Progress Notes (Signed)
Discharge planning, no HH needs identified. No plans for continued PT, has DME. [phone]

## 2017-05-25 NOTE — Progress Notes (Signed)
     Subjective: 1 Day Post-Op Procedure(s) (LRB): RIGHT TOTAL HIP ARTHROPLASTY ANTERIOR APPROACH (Right)   Patient reports pain as mild, pain controlled. No events throughout the night.  Due to his age and wanting to make sure he can he discharged safely home we will keep today to monitor and have him work with PT.   Objective:   VITALS:   Vitals:   05/25/17 0050 05/25/17 0601  BP: 115/64 (!) 125/50  Pulse: 77 73  Resp: 16 16  Temp: 98.7 F (37.1 C) 98.4 F (36.9 C)  SpO2: 98% 99%    Dorsiflexion/Plantar flexion intact Incision: dressing C/D/I No cellulitis present Compartment soft  LABS  Recent Labs  05/25/17 0547  HGB 9.7*  HCT 28.9*  WBC 9.2  PLT 134*     Recent Labs  05/25/17 0547  NA 137  K 4.2  BUN 29*  CREATININE 1.34*  GLUCOSE 156*     Assessment/Plan: 1 Day Post-Op Procedure(s) (LRB): RIGHT TOTAL HIP ARTHROPLASTY ANTERIOR APPROACH (Right) Foley cath d/c'ed Advance diet Up with therapy D/C IV fluids Discharge home eventually, when ready        [place] Pugh. Nathan Kim   PAC  05/25/2017, 7:50 AM

## 2017-05-25 NOTE — Progress Notes (Signed)
Physical Therapy Treatment Patient Details Name: Nathan Kim MRN: [id number] DOB: [birth year] Today's Date: 05/25/2017    History of Present Illness 6myo male . S/P R direct anterior THA    PT Comments    POD # 1 am session Assisted OOB with increased time, assisted with amb a greater distance in hallway.  Returned to room then performed some THR TE's followed by ICE.   Follow Up Recommendations  Home health PT;Supervision/Assistance - 24 hour     Equipment Recommendations  None recommended by PT    Recommendations for Other Services       Precautions / Restrictions Precautions Precautions: Fall Restrictions Weight Bearing Restrictions: No RLE Weight Bearing: Weight bearing as tolerated    Mobility  Bed Mobility Overal bed mobility: Needs Assistance Bed Mobility: Supine to Sit     Supine to sit: Mod assist;HOB elevated     General bed mobility comments: increased time and assist R LE  Transfers Overall transfer level: Needs assistance Equipment used: Rolling walker (2 wheeled) Transfers: Sit to/from [facility] Sit to Stand: Min guard Stand pivot transfers: Min guard       General transfer comment: 50% VC's on safety with turns and hand placement with sit to stand to avoid pulling up using walker   Ambulation/Gait Ambulation/Gait assistance: Min assist Ambulation Distance (Feet): 45 Feet Assistive device: Rolling walker (2 wheeled) Gait Pattern/deviations: Step-to pattern;Antalgic Gait velocity: decreased   General Gait Details: 25% VC's on proper walker to self distance and increased time   Stairs            Wheelchair Mobility    Modified Rankin (Stroke Patients Only)       Balance                                            Cognition Arousal/Alertness: Awake/alert Behavior During Therapy: WFL for tasks assessed/performed Overall Cognitive Status: Within Functional Limits for tasks assessed                                         Exercises      General Comments        Pertinent Vitals/Pain Pain Assessment: 0-10 Pain Score: 3  Pain Location: right hip Pain Descriptors / Indicators: Aching;Discomfort;Grimacing Pain Intervention(s): Monitored during session;Repositioned;Ice applied    Home Living Family/patient expects to be discharged to:: Private residence Living Arrangements: Spouse/significant other;Other relatives Available Help at Discharge: Family;Available 24 hours/day Type of Home: House Home Access: Stairs to enter Entrance Stairs-Rails: Right;Left;Can reach both Home Layout: One level Home Equipment: Environmental consultant - 2 wheels;Cane - single point Additional Comments: wife unable to assit. Daughter to assist.    Prior Function Level of Independence: Independent with assistive device(s)      Comments: with cane   PT Goals (current goals can now be found in the care plan section) Acute Rehab PT Goals Patient Stated Goal: to walk Progress towards PT goals: Progressing toward goals    Frequency    7X/week      PT Plan Current plan remains appropriate    Co-evaluation              AM-PAC PT "6 Clicks" Daily Activity  Outcome Measure  Difficulty turning over in bed (including adjusting bedclothes, sheets  and blankets)?: Unable Difficulty moving from lying on back to sitting on the side of the bed? : Unable Difficulty sitting down on and standing up from a chair with arms (e.g., wheelchair, bedside commode, etc,.)?: Unable Help needed moving to and from a bed to chair (including a wheelchair)?: Total Help needed walking in hospital room?: Total Help needed climbing 3-5 steps with a railing? : Total 6 Click Score: 6    End of Session Equipment Utilized During Treatment: Gait belt Activity Tolerance: Patient tolerated treatment well Patient left: in chair;with call bell/phone within reach;with family/visitor present Nurse  Communication: Mobility status PT Visit Diagnosis: Difficulty in walking, not elsewhere classified (R26.2);Pain Pain - Right/Left: Right Pain - part of body: Hip     Time: 7939-0300 PT Time Calculation (min) (ACUTE ONLY): 32 min  Charges:  $Gait Training: 8-22 mins $Therapeutic Exercise: 8-22 mins                    G Codes:       Rica Koyanagi  PTA [facility]  Acute  Rehab Pager      [phone]

## 2017-05-25 NOTE — Progress Notes (Signed)
Physical Therapy Treatment Patient Details Name: Nathan Kim MRN: [id number] DOB: 12-10-1919 Today's Date: 05/25/2017    History of Present Illness 19myo male . S/P R direct anterior THA    PT Comments    Assisted with amb in hallway an increased distance then assisted back to bed.  Pt plans to D/C to home tomorrow.    Follow Up Recommendations  Home health PT;Supervision/Assistance - 24 hour     Equipment Recommendations  None recommended by PT    Recommendations for Other Services       Precautions / Restrictions Precautions Precautions: Fall Restrictions Weight Bearing Restrictions: No RLE Weight Bearing: Weight bearing as tolerated    Mobility  Bed Mobility Overal bed mobility: Needs Assistance Bed Mobility: Supine to Sit     Supine to sit: Mod assist;HOB elevated     General bed mobility comments: increased time and assist R LE  Transfers Overall transfer level: Needs assistance Equipment used: Rolling walker (2 wheeled) Transfers: Sit to/from [facility] Sit to Stand: Min guard Stand pivot transfers: Min guard       General transfer comment: 50% VC's on safety with turns and hand placement with sit to stand to avoid pulling up using walker   Ambulation/Gait Ambulation/Gait assistance: Min assist Ambulation Distance (Feet): 95 Feet Assistive device: Rolling walker (2 wheeled) Gait Pattern/deviations: Step-to pattern;Antalgic Gait velocity: decreased   General Gait Details: 25% VC's on proper walker to self distance and increased time   Stairs            Wheelchair Mobility    Modified Rankin (Stroke Patients Only)       Balance                                            Cognition Arousal/Alertness: Awake/alert Behavior During Therapy: WFL for tasks assessed/performed Overall Cognitive Status: Within Functional Limits for tasks assessed                                         Exercises      General Comments        Pertinent Vitals/Pain Pain Assessment: 0-10 Pain Score: 3  Pain Location: right hip Pain Descriptors / Indicators: Aching;Discomfort;Grimacing Pain Intervention(s): Monitored during session;Repositioned;Ice applied    Home Living Family/patient expects to be discharged to:: Private residence Living Arrangements: Spouse/significant other;Other relatives Available Help at Discharge: Family;Available 24 hours/day Type of Home: House Home Access: Stairs to enter Entrance Stairs-Rails: Right;Left;Can reach both Home Layout: One level Home Equipment: Environmental consultant - 2 wheels;Cane - single point Additional Comments: wife unable to assit. Daughter to assist.    Prior Function Level of Independence: Independent with assistive device(s)      Comments: with cane   PT Goals (current goals can now be found in the care plan section) Acute Rehab PT Goals Patient Stated Goal: to walk Progress towards PT goals: Progressing toward goals    Frequency    7X/week      PT Plan Current plan remains appropriate    Co-evaluation              AM-PAC PT "6 Clicks" Daily Activity  Outcome Measure  Difficulty turning over in bed (including adjusting bedclothes, sheets and blankets)?: Unable Difficulty moving from lying on  back to sitting on the side of the bed? : Unable Difficulty sitting down on and standing up from a chair with arms (e.g., wheelchair, bedside commode, etc,.)?: Unable Help needed moving to and from a bed to chair (including a wheelchair)?: Total Help needed walking in hospital room?: Total Help needed climbing 3-5 steps with a railing? : Total 6 Click Score: 6    End of Session Equipment Utilized During Treatment: Gait belt Activity Tolerance: Patient tolerated treatment well Patient left: in chair;with call bell/phone within reach;with family/visitor present Nurse Communication: Mobility status PT Visit Diagnosis:  Difficulty in walking, not elsewhere classified (R26.2);Pain Pain - Right/Left: Right Pain - part of body: Hip     Time: 0903-0149 PT Time Calculation (min) (ACUTE ONLY): 32 min  Charges:  $Gait Training: 8-22 mins $Therapeutic Activity: 8-22 mins                    G Codes:       Rica Koyanagi  PTA [facility]  Acute  Rehab Pager      [phone]

## 2017-05-26 LAB — CBC
HEMATOCRIT: 31.9 % — AB (ref 39.0–52.0)
HEMOGLOBIN: 10.4 g/dL — AB (ref 13.0–17.0)
MCH: 29.5 pg (ref 26.0–34.0)
MCHC: 32.6 g/dL (ref 30.0–36.0)
MCV: 90.4 fL (ref 78.0–100.0)
Platelets: 154 10*3/uL (ref 150–400)
RBC: 3.53 MIL/uL — ABNORMAL LOW (ref 4.22–5.81)
RDW: 13.5 % (ref 11.5–15.5)
WBC: 10.2 10*3/uL (ref 4.0–10.5)

## 2017-05-26 LAB — BASIC METABOLIC PANEL
ANION GAP: 8 (ref 5–15)
BUN: 36 mg/dL — ABNORMAL HIGH (ref 6–20)
CHLORIDE: 106 mmol/L (ref 101–111)
CO2: 25 mmol/L (ref 22–32)
Calcium: 8.9 mg/dL (ref 8.9–10.3)
Creatinine, Ser: 1.35 mg/dL — ABNORMAL HIGH (ref 0.61–1.24)
GFR calc non Af Amer: 43 mL/min — ABNORMAL LOW (ref >60)
GFR, EST AFRICAN AMERICAN: 50 mL/min — AB (ref >60)
Glucose, Bld: 123 mg/dL — ABNORMAL HIGH (ref 65–99)
POTASSIUM: 3.9 mmol/L (ref 3.5–5.1)
Sodium: 139 mmol/L (ref 135–145)

## 2017-05-26 NOTE — Progress Notes (Signed)
**Note Nathan-Identified via Obfuscation** Occupational Therapy Treatment Patient Details Name: Nathan Kim MRN: [id number] DOB: 1920/09/04 Today's Date: 05/26/2017    History of present illness 89myo male . S/P R direct anterior THA   OT comments  Daughter will A as needed  Follow Up Recommendations  No OT follow up;Supervision/Assistance - 24 hour    Equipment Recommendations  None recommended by OT       Precautions / Restrictions Precautions Precautions: Fall Restrictions Weight Bearing Restrictions: No RLE Weight Bearing: Weight bearing as tolerated       Mobility Bed Mobility               General bed mobility comments: pt in chair  Transfers Overall transfer level: Needs assistance Equipment used: Rolling walker (2 wheeled) Transfers: Sit to/from [facility] Sit to Stand: Min guard;Min assist Stand pivot transfers: Min guard;Min assist                ADL either performed or assessed with clinical judgement   ADL Overall ADL's : Needs assistance/impaired                 Upper Body Dressing : Set up;Sitting   Lower Body Dressing: Minimal assistance;Sit to/from stand;Cueing for safety;Cueing for sequencing Lower Body Dressing Details (indicate cue type and reason): daughter will A as needed Toilet Transfer: Minimal assistance;RW;Ambulation   Toileting- Clothing Manipulation and Hygiene: Minimal assistance;Sit to/from stand;Cueing for safety;Cueing for sequencing;Cueing for compensatory techniques   Tub/ Shower Transfer: Minimal assistance;Cueing for safety;Cueing for sequencing Tub/Shower Transfer Details (indicate cue type and reason): simulated. Daughter will A as needed         Vision Patient Visual Report: No change from baseline            Cognition Arousal/Alertness: Awake/alert Behavior During Therapy: WFL for tasks assessed/performed Overall Cognitive Status: Within Functional Limits for tasks assessed                                                      Pertinent Vitals/ Pain       Pain Score: 2  Pain Location: right hip Pain Descriptors / Indicators: Sore Pain Intervention(s): Limited activity within patient's tolerance;Repositioned;Ice applied         Frequency  Min 2X/week        Progress Toward Goals  OT Goals(current goals can now be found in the care plan section)  Progress towards OT goals: Progressing toward goals     Plan Discharge plan remains appropriate       AM-PAC PT "6 Clicks" Daily Activity     Outcome Measure   Help from another person eating meals?: None Help from another person taking care of personal grooming?: None Help from another person toileting, which includes using toliet, bedpan, or urinal?: A Little Help from another person bathing (including washing, rinsing, drying)?: A Little Help from another person to put on and taking off regular upper body clothing?: None Help from another person to put on and taking off regular lower body clothing?: A Little 6 Click Score: 21    End of Session Equipment Utilized During Treatment: Rolling walker  OT Visit Diagnosis: Unsteadiness on feet (R26.81);Muscle weakness (generalized) (M62.81)   Activity Tolerance Patient tolerated treatment well   Patient Left in chair;with call bell/phone within reach;with family/visitor present   Nurse Communication Mobility  status        Time: 2179-8102 OT Time Calculation (min): 12 min  Charges: OT General Charges $OT Visit: 1 Procedure OT Treatments $Self Care/Home Management : 8-22 mins  Nathan Kim, Nathan   Nathan Kim 05/26/2017, 11:44 AM

## 2017-05-26 NOTE — Progress Notes (Signed)
     Subjective: 2 Days Post-Op Procedure(s) (LRB): RIGHT TOTAL HIP ARTHROPLASTY ANTERIOR APPROACH (Right)   Patient reports pain as mild, pain controlled. No events throughout the night.  Feels that he is progressing well and ready to get better.  Ready to be discharged home.   Objective:   VITALS:   Vitals:   05/25/17 2159 05/26/17 0605  BP: 128/65 133/66  Pulse: 71 80  Resp: 17 16  Temp: 98.1 F (36.7 C) 98.7 F (37.1 C)  SpO2: 95% 91%    Dorsiflexion/Plantar flexion intact Incision: dressing C/D/I No cellulitis present Compartment soft  LABS  Recent Labs  05/25/17 0547 05/26/17 0616  HGB 9.7* 10.4*  HCT 28.9* 31.9*  WBC 9.2 10.2  PLT 134* 154     Recent Labs  05/25/17 0547 05/26/17 0616  NA 137 139  K 4.2 3.9  BUN 29* 36*  CREATININE 1.34* 1.35*  GLUCOSE 156* 123*     Assessment/Plan: 2 Days Post-Op Procedure(s) (LRB): RIGHT TOTAL HIP ARTHROPLASTY ANTERIOR APPROACH (Right)   Up with therapy Discharge home Follow up in 2 weeks at [facility]. Follow up with OLIN,Joene Gelder D in 2 weeks.  Contact information:  [facility] [address], [address] [place]   PAC  05/26/2017, 1:08 PM

## 2017-05-26 NOTE — Progress Notes (Signed)
Physical Therapy Treatment Patient Details Name: Nathan Kim MRN: [id number] DOB: Oct 12, 1919 Today's Date: 05/26/2017    History of Present Illness   RIGHT TOTAL HIP ARTHROPLASTY ANTERIOR APPROACH (Right    PT Comments    POD # 2  Assisted with amb a greater distance in hallway, practiced stairs with daughter and performed/eduvcated on all THR TE's following handout.  Pt ready for D/C to home.   Follow Up Recommendations  Supervision/Assistance - 24 hour (given handout HEP)     Equipment Recommendations  None recommended by PT    Recommendations for Other Services       Precautions / Restrictions Precautions Precautions: Fall Restrictions Weight Bearing Restrictions: No RLE Weight Bearing: Weight bearing as tolerated    Mobility  Bed Mobility               General bed mobility comments: pt in chair  Transfers Overall transfer level: Needs assistance Equipment used: Rolling walker (2 wheeled) Transfers: Sit to/from [employer] Transfers Sit to Stand: Supervision;Min guard Stand pivot transfers: Supervision;Min guard       General transfer comment: 50% VC's on safety with turns and hand placement with sit to stand to avoid pulling up using walker   Ambulation/Gait Ambulation/Gait assistance: Supervision;Min guard Ambulation Distance (Feet): 65 Feet Assistive device: Rolling walker (2 wheeled) Gait Pattern/deviations: Step-to pattern;Antalgic Gait velocity: decreased   General Gait Details: 25% VC's on proper walker to self distance and increased time   Stairs Stairs: Yes   Stair Management: Alternating pattern;Forwards;Two rails Number of Stairs: 4 General stair comments: with daughter present for "hands on" instruction  Wheelchair Mobility    Modified Rankin (Stroke Patients Only)       Balance                                            Cognition Arousal/Alertness: Awake/alert Behavior During Therapy: WFL  for tasks assessed/performed Overall Cognitive Status: Within Functional Limits for tasks assessed                                        Exercises   Total Hip Replacement TE's 10 reps ankle pumps 10 reps knee presses 10 reps heel slides 10 reps SAQ's 10 reps ABD 10 reps LAQ's 10eps all standing TE's Followed by ICE     General Comments        Pertinent Vitals/Pain Pain Assessment: 0-10 Pain Score: 4  Pain Location: right hip Pain Descriptors / Indicators: Grimacing;Sore;Tightness;Operative site guarding Pain Intervention(s): Monitored during session;Repositioned;Ice applied    Home Living                      Prior Function            PT Goals (current goals can now be found in the care plan section) Progress towards PT goals: Progressing toward goals    Frequency    7X/week      PT Plan Current plan remains appropriate    Co-evaluation              AM-PAC PT "6 Clicks" Daily Activity  Outcome Measure  Difficulty turning over in bed (including adjusting bedclothes, sheets and blankets)?: A Lot   Difficulty sitting down on and standing up from a  chair with arms (e.g., wheelchair, bedside commode, etc,.)?: A Lot Help needed moving to and from a bed to chair (including a wheelchair)?: A Lot Help needed walking in hospital room?: A Lot Help needed climbing 3-5 steps with a railing? : A Lot 6 Click Score: 10    End of Session Equipment Utilized During Treatment: Gait belt Activity Tolerance: Patient tolerated treatment well Patient left: in chair;with call bell/phone within reach;with family/visitor present Nurse Communication:  (pt ready for D/C to home) PT Visit Diagnosis: Difficulty in walking, not elsewhere classified (R26.2);Pain Pain - Right/Left: Right Pain - part of body: Hip     Time: 4825-0037 PT Time Calculation (min) (ACUTE ONLY): 31 min  Charges:  $Gait Training: 8-22 mins $Therapeutic Exercise: 8-22  mins                    G Codes:       Rica Koyanagi  PTA [facility]  Acute  Rehab Pager      [phone]

## 2017-05-30 ENCOUNTER — Ambulatory Visit: Payer: Medicare Other | Admitting: Cardiovascular Disease

## 2017-05-31 NOTE — Discharge Summary (Signed)
Physician Discharge Summary  Patient ID: Nathan Kim MRN: [id number] DOB/AGE: 81-04-1920 81 y.o.  Admit date: 05/24/2017 Discharge date: 05/26/2017   Procedures:  Procedure(s) (LRB): RIGHT TOTAL HIP ARTHROPLASTY ANTERIOR APPROACH (Right)  Attending Physician:  Dr. Paralee Cancel   Admission Diagnoses:   Right hip primary OA / pain  Discharge Diagnoses:  Principal Problem:   S/P right THA, AA  Past Medical History:  Diagnosis Date  . Arthritis   . BPH (benign prostatic hyperplasia)   . Hypertension   . Phlebitis 10/2016    HPI:    Nathan Kim, 83 y.o. male, has a history of pain and functional disability in the right hip(s) due to arthritis and patient has failed non-surgical conservative treatments for greater than 12 weeks to include NSAID's and/or analgesics, use of assistive devices and activity modification.  Onset of symptoms was abrupt starting ~1 years ago with gradually worsening course since that time.The patient noted no past surgery on the right hip(s).  Patient currently rates pain in the right hip at 6 out of 10 with activity. Patient has night pain, worsening of pain with activity and weight bearing, trendelenberg gait, pain that interfers with activities of daily living and pain with passive range of motion. Patient has evidence of periarticular osteophytes and joint space narrowing by imaging studies. This condition presents safety issues increasing the risk of falls.  There is no current active infection.  Risks, benefits and expectations were discussed with the patient.  Risks including but not limited to the risk of anesthesia, blood clots, nerve damage, blood vessel damage, failure of the prosthesis, infection and up to and including death.  Patient understand the risks, benefits and expectations and wishes to proceed with surgery.   PCP: [name], MD   Discharged Condition: good  Hospital Course:  Patient underwent the above stated procedure on  05/24/2017. Patient tolerated the procedure well and brought to the recovery room in good condition and subsequently to the floor.  POD #1 BP: 125/50 ; Pulse: 73 ; Temp: 98.4 F (36.9 C) ; Resp: 16 Patient reports pain as mild, pain controlled. No events throughout the night.  Due to his age and wanting to make sure he can he discharged safely home we will keep today to monitor and have him work with PT.  Dorsiflexion/plantar flexion intact, incision: dressing C/D/I, no cellulitis present and compartment soft.   LABS  Basename    HGB     9.7  HCT     28.9   POD #2  BP: 133/66 ; Pulse: 80 ; Temp: 98.7 F (37.1 C) ; Resp: 16 Patient reports pain as mild, pain controlled. No events throughout the night.  Feels that he is progressing well and ready to get better.  Ready to be discharged home. Dorsiflexion/plantar flexion intact, incision: dressing C/D/I, no cellulitis present and compartment soft.   LABS  Basename    HGB     10.4  HCT     31.9    Discharge Exam: General appearance: alert, cooperative and no distress Extremities: Homans sign is negative, no sign of DVT, no edema, redness or tenderness in the calves or thighs and no ulcers, gangrene or trophic changes  Disposition: Home with follow up in 2 weeks   Follow-up Information    Paralee Cancel, MD. Schedule an appointment as soon as possible for a visit in 2 week(s).   Specialty:  Orthopedic Surgery Contact information: [address] [place] [address] [phone]  Discharge Instructions    Call MD / Call 911    Complete by:  As directed    If you experience chest pain or shortness of breath, CALL 911 and be transported to the hospital emergency room.  If you develope a fever above 101 F, pus (white drainage) or increased drainage or redness at the wound, or calf pain, call your surgeon's office.   Change dressing    Complete by:  As directed    Maintain surgical dressing until follow  up in the clinic. If the edges start to pull up, may reinforce with tape. If the dressing is no longer working, may remove and cover with gauze and tape, but must keep the area dry and clean.  Call with any questions or concerns.   Constipation Prevention    Complete by:  As directed    Drink plenty of fluids.  Prune juice may be helpful.  You may use a stool softener, such as Colace (over the counter) 100 mg twice a day.  Use MiraLax (over the counter) for constipation as needed.   Diet - low sodium heart healthy    Complete by:  As directed    Discharge instructions    Complete by:  As directed    Maintain surgical dressing until follow up in the clinic. If the edges start to pull up, may reinforce with tape. If the dressing is no longer working, may remove and cover with gauze and tape, but must keep the area dry and clean.  Follow up in 2 weeks at [facility] Call with any questions or concerns.   Increase activity slowly as tolerated    Complete by:  As directed    Weight bearing as tolerated with assist device (walker, cane, etc) as directed, use it as long as suggested by your surgeon or therapist, typically at least 4-6 weeks.   TED hose    Complete by:  As directed    Use stockings (TED hose) for 2 weeks on both leg(s).  You may remove them at night for sleeping.      Allergies as of 05/26/2017   Not on File     Medication List    STOP taking these medications   aspirin EC 81 MG tablet Replaced by:  aspirin 81 MG chewable tablet   HYDROcodone-acetaminophen 5-325 MG tablet Commonly known as:  NORCO/VICODIN Replaced by:  HYDROcodone-acetaminophen 7.5-325 MG tablet   naproxen sodium 220 MG tablet Commonly known as:  ANAPROX   traMADol 50 MG tablet Commonly known as:  ULTRAM     TAKE these medications   amLODipine 2.5 MG tablet Commonly known as:  NORVASC Take 2.5 mg by mouth every evening.   aspirin 81 MG chewable tablet Commonly known as:  ASPIRIN  CHILDRENS Chew 1 tablet (81 mg total) by mouth 2 (two) times daily. Take for 4 weeks, then resume regular dose. Replaces:  aspirin EC 81 MG tablet   docusate sodium 100 MG capsule Commonly known as:  COLACE Take 1 capsule (100 mg total) by mouth 2 (two) times daily.   ferrous sulfate 325 (65 FE) MG tablet Commonly known as:  FERROUSUL Take 1 tablet (325 mg total) by mouth 3 (three) times daily with meals.   furosemide 20 MG tablet Commonly known as:  LASIX Take 1 tablet (20 mg total) by mouth every other day. What changed:  additional instructions   HYDROcodone-acetaminophen 7.5-325 MG tablet Commonly known as:  NORCO Take 1-2 tablets by mouth  every 4 (four) hours as needed for moderate pain or severe pain. Replaces:  HYDROcodone-acetaminophen 5-325 MG tablet   lisinopril 20 MG tablet Commonly known as:  PRINIVIL,ZESTRIL Take 20 mg by mouth daily.   methocarbamol 500 MG tablet Commonly known as:  ROBAXIN Take 1 tablet (500 mg total) by mouth every 6 (six) hours as needed for muscle spasms.   metoprolol tartrate 25 MG tablet Commonly known as:  LOPRESSOR Take 0.5 tablets (12.5 mg total) by mouth 2 (two) times daily.   polyethylene glycol packet Commonly known as:  MIRALAX / GLYCOLAX Take 17 g by mouth 2 (two) times daily.   tamsulosin 0.4 MG Caps capsule Commonly known as:  FLOMAX Take 0.4 mg by mouth daily.            Discharge Care Instructions        Start     Ordered   05/24/17 0000  aspirin (ASPIRIN CHILDRENS) 81 MG chewable tablet  2 times daily    Question:  Supervising Provider  Answer:  Alvan Dame, Alexius Ellington   05/24/17 1009   05/24/17 0000  docusate sodium (COLACE) 100 MG capsule  2 times daily    Question:  Supervising Provider  Answer:  Paralee Cancel   05/24/17 1009   05/24/17 0000  ferrous sulfate (FERROUSUL) 325 (65 FE) MG tablet  3 times daily with meals    Question:  Supervising Provider  Answer:  Paralee Cancel   05/24/17 1009   05/24/17 0000   polyethylene glycol (MIRALAX / GLYCOLAX) packet  2 times daily    Question:  Supervising Provider  Answer:  Alvan Dame, Aundria Bitterman   05/24/17 1009   05/24/17 0000  methocarbamol (ROBAXIN) 500 MG tablet  Every 6 hours PRN    Question:  Supervising Provider  Answer:  Paralee Cancel   05/24/17 1009   05/24/17 0000  HYDROcodone-acetaminophen (NORCO) 7.5-325 MG tablet  Every 4 hours PRN    Question:  Supervising Provider  Answer:  Paralee Cancel   05/24/17 1009   05/24/17 0000  Call MD / Call 911    Comments:  If you experience chest pain or shortness of breath, CALL 911 and be transported to the hospital emergency room.  If you develope a fever above 101 F, pus (white drainage) or increased drainage or redness at the wound, or calf pain, call your surgeon's office.   05/24/17 2044   05/24/17 0000  Discharge instructions    Comments:  Maintain surgical dressing until follow up in the clinic. If the edges start to pull up, may reinforce with tape. If the dressing is no longer working, may remove and cover with gauze and tape, but must keep the area dry and clean.  Follow up in 2 weeks at [facility]. Call with any questions or concerns.   05/24/17 2044   05/24/17 0000  Diet - low sodium heart healthy     05/24/17 2044   05/24/17 0000  Constipation Prevention    Comments:  Drink plenty of fluids.  Prune juice may be helpful.  You may use a stool softener, such as Colace (over the counter) 100 mg twice a day.  Use MiraLax (over the counter) for constipation as needed.   05/24/17 2044   05/24/17 0000  Increase activity slowly as tolerated    Comments:  Weight bearing as tolerated with assist device (walker, cane, etc) as directed, use it as long as suggested by your surgeon or therapist, typically at least 4-6 weeks.  05/24/17 2044   05/24/17 0000  Change dressing    Comments:  Maintain surgical dressing until follow up in the clinic. If the edges start to pull up, may reinforce with tape. If the  dressing is no longer working, may remove and cover with gauze and tape, but must keep the area dry and clean.  Call with any questions or concerns.   05/24/17 2044   05/24/17 0000  TED hose    Comments:  Use stockings (TED hose) for 2 weeks on both leg(s).  You may remove them at night for sleeping.   05/24/17 2044       Signed: West Pugh. Hayslee Casebolt   PA-C  05/31/2017, 9:50 AM

## 2017-11-15 ENCOUNTER — Other Ambulatory Visit (HOSPITAL_COMMUNITY): Payer: Self-pay | Admitting: Neurological Surgery

## 2017-11-15 DIAGNOSIS — M5416 Radiculopathy, lumbar region: Secondary | ICD-10-CM

## 2017-11-17 ENCOUNTER — Other Ambulatory Visit: Payer: Self-pay | Admitting: Cardiovascular Disease

## 2017-11-17 MED ORDER — METOPROLOL TARTRATE 25 MG PO TABS: 12.5000 mg | ORAL_TABLET | Freq: Two times a day (BID) | ORAL | 1 refills | Status: DC

## 2017-11-17 NOTE — Telephone Encounter (Signed)
° ° ° °  1. Which medications need to be refilled? (please list name of each medication and dose if known) metoprolol tartrate (LOPRESSOR) 25 MG    2. Which pharmacy/location (including street and city if local pharmacy) is medication to be sent to? [facility]   3. Do they need a 30 day or 90 day supply?

## 2017-11-17 NOTE — Telephone Encounter (Signed)
Medication sent to pharmacy  

## 2017-11-18 ENCOUNTER — Ambulatory Visit (HOSPITAL_COMMUNITY)
Admission: RE | Admit: 2017-11-18 | Discharge: 2017-11-18 | Disposition: A | Discharge status: Home / Self Care | Payer: Medicare Other | Source: Ambulatory Visit | Attending: Neurological Surgery | Admitting: Neurological Surgery

## 2017-11-18 DIAGNOSIS — M5416 Radiculopathy, lumbar region: Secondary | ICD-10-CM

## 2017-11-18 DIAGNOSIS — M48061 Spinal stenosis, lumbar region without neurogenic claudication: Secondary | ICD-10-CM | POA: Insufficient documentation

## 2017-11-18 LAB — POCT I-STAT CREATININE: Creatinine, Ser: 1.5 mg/dL — ABNORMAL HIGH (ref 0.61–1.24)

## 2017-11-18 MED ORDER — GADOBENATE DIMEGLUMINE 529 MG/ML IV SOLN
7.0000 mL | Freq: Once | INTRAVENOUS | Status: AC | PRN
  Administered 2017-11-18: 7 mL via INTRAVENOUS

## 2018-05-10 ENCOUNTER — Encounter: Payer: Self-pay | Admitting: Cardiovascular Disease

## 2018-05-10 ENCOUNTER — Ambulatory Visit: Payer: Medicare Other | Admitting: Cardiovascular Disease

## 2018-05-10 VITALS — BP 110/50 | HR 64 | Ht 70.0 in | Wt 169.0 lb

## 2018-05-10 DIAGNOSIS — I480 Paroxysmal atrial fibrillation: Secondary | ICD-10-CM

## 2018-05-10 DIAGNOSIS — R600 Localized edema: Secondary | ICD-10-CM

## 2018-05-10 DIAGNOSIS — I10 Essential (primary) hypertension: Secondary | ICD-10-CM

## 2018-05-10 NOTE — Progress Notes (Signed)
SUBJECTIVE: The patient presents for routine follow-up.  He has a history of hypertension, bilateral leg edema,and paroxysmal atrial fibrillation.   Echocardiogram performed in February 2018 at an outside facility demonstrated normal left ventricular systolic function, EF 60 to 65%, mild LVH, and mildly elevated pulmonary pressures.  ECG performed in the office today which I ordered and personally interpreted demonstrates normal sinus rhythm with incomplete right bundle branch block, left anterior fascicular block, and first-degree AV block, PR interval 242 ms.  The patient denies any symptoms of chest pain, palpitations, shortness of breath, lightheadedness, dizziness, leg swelling, orthopnea, PND, and syncope.  Primary issues relate to left leg pain after hip replacement surgery.    Review of Systems: As per "subjective", otherwise negative.  No Known Allergies  Current Outpatient Medications  Medication Sig Dispense Refill  . amLODipine (NORVASC) 2.5 MG tablet Take 2.5 mg by mouth every evening.   4  . aspirin EC 81 MG tablet Take 81 mg by mouth daily.    . furosemide (LASIX) 20 MG tablet Take 1 tablet (20 mg total) by mouth every other day. (Patient taking differently: Take 20 mg by mouth every other day. In the evening) 45 tablet 3  . HYDROcodone-acetaminophen (NORCO) 7.5-325 MG tablet Take 1-2 tablets by mouth every 4 (four) hours as needed for moderate pain or severe pain. 60 tablet 0  . lisinopril (PRINIVIL,ZESTRIL) 20 MG tablet Take 20 mg by mouth daily.   3  . methocarbamol (ROBAXIN) 500 MG tablet Take 1 tablet (500 mg total) by mouth every 6 (six) hours as needed for muscle spasms. 40 tablet 0  . metoprolol tartrate (LOPRESSOR) 25 MG tablet Take 0.5 tablets (12.5 mg total) by mouth 2 (two) times daily. 90 tablet 1  . tamsulosin (FLOMAX) 0.4 MG CAPS capsule Take 0.4 mg by mouth daily.     No current facility-administered medications for this visit.     Past Medical  History:  Diagnosis Date  . Arthritis   . BPH (benign prostatic hyperplasia)   . Hypertension   . Phlebitis 10/2016    Past Surgical History:  Procedure Laterality Date  . BACK SURGERY    . carpel tunnel    . EYE SURGERY    . HERNIA REPAIR    . LUMBAR LAMINECTOMY/DECOMPRESSION MICRODISCECTOMY N/A 02/27/2016   Procedure: LUMBAR LAMINECTOMY/DECOMPRESSION MICRODISCECTOMY 3 LEVELS;  Surgeon: Kristeen Miss, MD;  Location: [facility];  Service: Neurosurgery;  Laterality: N/A;  L2-3 L3-4 L4-5 Laminectomy  . TOTAL HIP ARTHROPLASTY Right 05/24/2017   Procedure: RIGHT TOTAL HIP ARTHROPLASTY ANTERIOR APPROACH;  Surgeon: Paralee Cancel, MD;  Location: [facility];  Service: Orthopedics;  Laterality: Right;  70 mins    Social History   Socioeconomic History  . Marital status: Married    Spouse name: Not on file  . Number of children: Not on file  . Years of education: Not on file  . Highest education level: Not on file  Occupational History  . Not on file  Social Needs  . Financial resource strain: Not on file  . Food insecurity:    Worry: Not on file    Inability: Not on file  . Transportation needs:    Medical: Not on file    Non-medical: Not on file  Tobacco Use  . Smoking status: Former Smoker    Packs/day: 0.50    Years: 50.00    Pack years: 25.00    Types: Cigarettes    Start date: 07/03/1929  Last attempt to quit: 07/04/1979    Years since quitting: 38.8  . Smokeless tobacco: Never Used  Substance and Sexual Activity  . Alcohol use: No    Alcohol/week: 0.0 oz  . Drug use: No  . Sexual activity: Not on file  Lifestyle  . Physical activity:    Days per week: Not on file    Minutes per session: Not on file  . Stress: Not on file  Relationships  . Social connections:    Talks on phone: Not on file    Gets together: Not on file    Attends religious service: Not on file    Active member of club or organization: Not on file    Attends meetings of clubs or organizations:  Not on file    Relationship status: Not on file  . Intimate partner violence:    Fear of current or ex partner: Not on file    Emotionally abused: Not on file    Physically abused: Not on file    Forced sexual activity: Not on file  Other Topics Concern  . Not on file  Social History Narrative  . Not on file     Vitals:   05/10/18 1401  BP: (!) 110/50  Pulse: 64  SpO2: 96%  Weight: 169 lb (76.7 kg)  Height: 5\' 10"  (1.778 m)    Wt Readings from Last 3 Encounters:  05/10/18 169 lb (76.7 kg)  05/24/17 162 lb (73.5 kg)  05/18/17 162 lb 8 oz (73.7 kg)     PHYSICAL EXAM General: NAD HEENT: Normal. Neck: No JVD, no thyromegaly. Lungs: Clear to auscultation bilaterally with normal respiratory effort. CV: Regular rate and rhythm, normal S1/S2, no S3/S4, no murmur. No pretibial or periankle edema.  No carotid bruit.   Abdomen: Soft, nontender, no distention.  Neurologic: Alert and oriented.  Psych: Normal affect. Skin: Normal. Musculoskeletal: No gross deformities.    ECG: Reviewed above under Subjective   Labs: Lab Results  Component Value Date/Time   K 3.9 05/26/2017 06:16 AM   BUN 36 (H) 05/26/2017 06:16 AM   BUN 36 01/20/2017 11:45 AM   CREATININE 1.50 (H) 11/18/2017 08:54 AM   CREATININE 1.78 (H) 12/16/2016 11:10 AM   HGB 10.4 (L) 05/26/2017 06:16 AM     Lipids: No results found for: LDLCALC, LDLDIRECT, CHOL, TRIG, HDL     ASSESSMENT AND PLAN:  1.  Paroxysmal atrial fibrillation: Symptomatically stable. He is in sinus rhythm today.  Continue metoprolol 12.5 mg twice daily. For now continue aspirin although no clear benefit in terms of thromboembolic risk reduction. We previously discussed anticoagulation therapy but he declined.  2. Essential HTN: Controlled. No changes.  3. Bilateral leg edema: Controlled on current dose of Lasix. No changes.      Disposition: Follow up 1 year   Kate Sable, M.D., F.A.C.C.

## 2018-05-10 NOTE — Patient Instructions (Signed)

## 2018-06-06 ENCOUNTER — Other Ambulatory Visit: Payer: Self-pay | Admitting: Cardiovascular Disease

## 2018-06-06 ENCOUNTER — Telehealth: Payer: Self-pay | Admitting: Cardiovascular Disease

## 2018-06-06 MED ORDER — METOPROLOL TARTRATE 25 MG PO TABS: 12.5000 mg | ORAL_TABLET | Freq: Two times a day (BID) | ORAL | 1 refills | Status: DC

## 2018-06-06 NOTE — Telephone Encounter (Signed)
Medication sent to pharmacy  

## 2018-06-06 NOTE — Telephone Encounter (Signed)
°*  STAT* If patient is at the pharmacy, call can be transferred to refill team.   1. Which medications need to be refilled? metoprolol tartrate (LOPRESSOR) 25 MG tablet    2. Which pharmacy/location (including street and city if local pharmacy) is medication to be sent to?  [facility] [address], [place] [address] [place]  3. Do they need a 30 day or 90 day supply?

## 2018-10-26 ENCOUNTER — Other Ambulatory Visit: Payer: Self-pay | Admitting: Neurological Surgery

## 2018-10-26 DIAGNOSIS — M25552 Pain in left hip: Secondary | ICD-10-CM

## 2018-11-02 ENCOUNTER — Ambulatory Visit
Admission: RE | Admit: 2018-11-02 | Discharge: 2018-11-02 | Disposition: A | Discharge status: Home / Self Care | Payer: Medicare Other | Source: Ambulatory Visit | Attending: Neurological Surgery | Admitting: Neurological Surgery

## 2018-11-02 DIAGNOSIS — M25552 Pain in left hip: Secondary | ICD-10-CM

## 2018-11-02 MED ORDER — IOPAMIDOL (ISOVUE-M 200) INJECTION 41%
1.0000 mL | Freq: Once | INTRAMUSCULAR | Status: AC
  Administered 2018-11-02: 1 mL via INTRA_ARTICULAR

## 2018-11-02 MED ORDER — METHYLPREDNISOLONE ACETATE 40 MG/ML INJ SUSP (RADIOLOG
120.0000 mg | Freq: Once | INTRAMUSCULAR | Status: AC
  Administered 2018-11-02: 120 mg via INTRA_ARTICULAR

## 2018-12-22 ENCOUNTER — Telehealth: Payer: Self-pay | Admitting: Cardiology

## 2018-12-22 MED ORDER — METOPROLOL TARTRATE 25 MG PO TABS: 12.5000 mg | ORAL_TABLET | Freq: Two times a day (BID) | ORAL | 1 refills | Status: DC

## 2018-12-22 NOTE — Telephone Encounter (Signed)
°*  STAT* If patient is at the pharmacy, call can be transferred to refill team.   1. Which medications need to be refilled?   metoprolol tartrate (LOPRESSOR) 25 MG tablet    2. Which pharmacy/location (including street and city if local pharmacy) is medication to be sent to? Mail order pharmacy [facility], [place] [address] [place]  3. Do they need a 30 day or 90 day supply?

## 2018-12-22 NOTE — Telephone Encounter (Signed)
Medication sent to pharmacy  

## 2019-05-28 ENCOUNTER — Telehealth: Payer: Self-pay | Admitting: Cardiovascular Disease

## 2019-05-28 ENCOUNTER — Other Ambulatory Visit: Payer: Self-pay | Admitting: Cardiology

## 2019-05-28 NOTE — Telephone Encounter (Signed)
Virtual Visit Pre-Appointment Phone Call  "(Name), I am calling you today to discuss your upcoming appointment. We are currently trying to limit exposure to the virus that causes COVID-19 by seeing patients at home rather than in the office."  1. "What is the BEST phone number to call the day of the visit?" - include this in appointment notes  2. Do you have or have access to (through a family member/friend) a smartphone with video capability that we can use for your visit?" a. If yes - list this number in appt notes as cell (if different from BEST phone #) and list the appointment type as a VIDEO visit in appointment notes b. If no - list the appointment type as a PHONE visit in appointment notes  3. Confirm consent - "In the setting of the current Covid19 crisis, you are scheduled for a (phone or video) visit with your provider on (date) at (time).  Just as we do with many in-office visits, in order for you to participate in this visit, we must obtain consent.  If you'd like, I can send this to your mychart (if signed up) or email for you to review.  Otherwise, I can obtain your verbal consent now.  All virtual visits are billed to your insurance company just like a normal visit would be.  By agreeing to a virtual visit, we'd like you to understand that the technology does not allow for your provider to perform an examination, and thus may limit your provider's ability to fully assess your condition. If your provider identifies any concerns that need to be evaluated in person, we will make arrangements to do so.  Finally, though the technology is pretty good, we cannot assure that it will always work on either your or our end, and in the setting of a video visit, we may have to convert it to a phone-only visit.  In either situation, we cannot ensure that we have a secure connection.  Are you willing to proceed?" STAFF: Did the patient verbally acknowledge consent to telehealth visit? Document  YES/NO here: yes  4. Advise patient to be prepared - "Two hours prior to your appointment, go ahead and check your blood pressure, pulse, oxygen saturation, and your weight (if you have the equipment to check those) and write them all down. When your visit starts, your provider will ask you for this information. If you have an Apple Watch or Kardia device, please plan to have heart rate information ready on the day of your appointment. Please have a pen and paper handy nearby the day of the visit as well."  5. Give patient instructions for MyChart download to smartphone OR Doximity/Doxy.me as below if video visit (depending on what platform provider is using)  6. Inform patient they will receive a phone call 15 minutes prior to their appointment time (may be from unknown caller ID) so they should be prepared to answer    [place] has been deemed a candidate for a follow-up tele-health visit to limit community exposure during the Covid-19 pandemic. I spoke with the patient via phone to ensure availability of phone/video source, confirm preferred email & phone number, and discuss instructions and expectations.  I reminded Kienan Pound to be prepared with any vital sign and/or heart rhythm information that could potentially be obtained via home monitoring, at the time of his visit. I reminded Jash Wahlen to expect a phone call prior to his visit.  Weston Anna 05/28/2019 3:56 PM   INSTRUCTIONS FOR DOWNLOADING THE MYCHART APP TO SMARTPHONE  - The patient must first make sure to have activated MyChart and know their login information - If Apple, go to [employer] and type in MyChart in the search bar and download the app. If Android, ask patient to go to [employer] and type in [place] in the search bar and download the app. The app is free but as with any other app downloads, their phone may require them to verify saved payment information or  Apple/Android password.  - The patient will need to then log into the app with their MyChart username and password, and select [facility] as their healthcare provider to link the account. When it is time for your visit, go to the MyChart app, find appointments, and click Begin Video Visit. Be sure to Select Allow for your device to access the Microphone and Camera for your visit. You will then be connected, and your provider will be with you shortly.  **If they have any issues connecting, or need assistance please contact MyChart service desk [phone])**  **If using a computer, in order to ensure the best quality for their visit they will need to use either of the following Internet Browsers: [employer], or Google Chrome**  IF USING DOXIMITY or DOXY.ME - The patient will receive a link just prior to their visit by text.     FULL LENGTH CONSENT FOR TELE-HEALTH VISIT   I hereby voluntarily request, consent and authorize [facility] and its employed or contracted physicians, physician assistants, nurse practitioners or other licensed health care professionals (the Practitioner), to provide me with telemedicine health care services (the Services") as deemed necessary by the treating Practitioner. I acknowledge and consent to receive the Services by the Practitioner via telemedicine. I understand that the telemedicine visit will involve communicating with the Practitioner through live audiovisual communication technology and the disclosure of certain medical information by electronic transmission. I acknowledge that I have been given the opportunity to request an in-person assessment or other available alternative prior to the telemedicine visit and am voluntarily participating in the telemedicine visit.  I understand that I have the right to withhold or withdraw my consent to the use of telemedicine in the course of my care at any time, without affecting my right to future care  or treatment, and that the Practitioner or I may terminate the telemedicine visit at any time. I understand that I have the right to inspect all information obtained and/or recorded in the course of the telemedicine visit and may receive copies of available information for a reasonable fee.  I understand that some of the potential risks of receiving the Services via telemedicine include:   Delay or interruption in medical evaluation due to technological equipment failure or disruption;  Information transmitted may not be sufficient (e.g. poor resolution of images) to allow for appropriate medical decision making by the Practitioner; and/or   In rare instances, security protocols could fail, causing a breach of personal health information.  Furthermore, I acknowledge that it is my responsibility to provide information about my medical history, conditions and care that is complete and accurate to the best of my ability. I acknowledge that Practitioner's advice, recommendations, and/or decision may be based on factors not within their control, such as incomplete or inaccurate data provided by me or distortions of diagnostic images or specimens that may result from electronic transmissions. I understand that the  practice of medicine is not an Chief Strategy Officer and that Practitioner makes no warranties or guarantees regarding treatment outcomes. I acknowledge that I will receive a copy of this consent concurrently upon execution via email to the email address I last provided but may also request a printed copy by calling the office of [facility].    I understand that my insurance will be billed for this visit.   I have read or had this consent read to me.  I understand the contents of this consent, which adequately explains the benefits and risks of the Services being provided via telemedicine.   I have been provided ample opportunity to ask questions regarding this consent and the Services and have had  my questions answered to my satisfaction.  I give my informed consent for the services to be provided through the use of telemedicine in my medical care  By participating in this telemedicine visit I agree to the above.

## 2019-05-31 ENCOUNTER — Telehealth (INDEPENDENT_AMBULATORY_CARE_PROVIDER_SITE_OTHER): Payer: Medicare Other | Admitting: Cardiovascular Disease

## 2019-05-31 ENCOUNTER — Encounter: Payer: Self-pay | Admitting: Cardiovascular Disease

## 2019-05-31 VITALS — BP 104/57 | HR 80 | Ht 71.0 in | Wt 152.0 lb

## 2019-05-31 DIAGNOSIS — I480 Paroxysmal atrial fibrillation: Secondary | ICD-10-CM | POA: Diagnosis not present

## 2019-05-31 DIAGNOSIS — R600 Localized edema: Secondary | ICD-10-CM

## 2019-05-31 DIAGNOSIS — I10 Essential (primary) hypertension: Secondary | ICD-10-CM

## 2019-05-31 NOTE — Progress Notes (Signed)
Virtual Visit via Telephone Note   This visit type was conducted due to national recommendations for restrictions regarding the COVID-19 Pandemic (e.g. social distancing) in an effort to limit this patient's exposure and mitigate transmission in our community.  Due to his co-morbid illnesses, this patient is at least at moderate risk for complications without adequate follow up.  This format is felt to be most appropriate for this patient at this time.  The patient did not have access to video technology/had technical difficulties with video requiring transitioning to audio format only (telephone).  All issues noted in this document were discussed and addressed.  No physical exam could be performed with this format.  Please refer to the patient's chart for his  consent to telehealth for [facility].   Date:  05/31/2019   ID:  Nathan Kim, DOB [birth year], MRN [id number]  Patient Location: Home Provider Location: Office  PCP:  [name], MD  Cardiologist:  Kate Sable, MD  Electrophysiologist:  None   Evaluation Performed:  Follow-Up Visit  Chief Complaint:  Hypertension, bilateral leg edema,and paroxysmal atrial fibrillation  History of Present Illness:    Nathan Kim is a 85 y.o. male with a history of hypertension, bilateral leg edema,and paroxysmal atrial fibrillation.   Echocardiogram performed in February 2018 at an outside facility demonstrated normal left ventricular systolic function, EF 60 to 65%, mild LVH, and mildly elevated pulmonary pressures.  He's had difficulty walking due to left knee pain from arthritis. He recently received a spine injection.  He denies chest pain and palpitations. Leg swelling is controlled.  He works in his garden daily.  The patient does not have symptoms concerning for COVID-19 infection (fever, chills, cough, or new shortness of breath).    Past Medical History:  Diagnosis Date  . Arthritis   . BPH (benign  prostatic hyperplasia)   . Hypertension   . Phlebitis 10/2016   Past Surgical History:  Procedure Laterality Date  . BACK SURGERY    . carpel tunnel    . EYE SURGERY    . HERNIA REPAIR    . LUMBAR LAMINECTOMY/DECOMPRESSION MICRODISCECTOMY N/A 02/27/2016   Procedure: LUMBAR LAMINECTOMY/DECOMPRESSION MICRODISCECTOMY 3 LEVELS;  Surgeon: Kristeen Miss, MD;  Location: [place] [facility];  Service: Neurosurgery;  Laterality: N/A;  L2-3 L3-4 L4-5 Laminectomy  . TOTAL HIP ARTHROPLASTY Right 05/24/2017   Procedure: RIGHT TOTAL HIP ARTHROPLASTY ANTERIOR APPROACH;  Surgeon: Paralee Cancel, MD;  Location: [facility];  Service: Orthopedics;  Laterality: Right;  70 mins     Current Meds  Medication Sig  . amLODipine (NORVASC) 2.5 MG tablet Take 2.5 mg by mouth every evening.   Marland Kitchen aspirin EC 81 MG tablet Take 81 mg by mouth every other day.   . furosemide (LASIX) 20 MG tablet Take 1 tablet (20 mg total) by mouth every other day. (Patient taking differently: Take 20 mg by mouth every other day. In the evening)  . lisinopril (PRINIVIL,ZESTRIL) 20 MG tablet Take 20 mg by mouth daily.   . metoprolol tartrate (LOPRESSOR) 25 MG tablet TAKE ONE-HALF TABLET BY MOUTH TWICE DAILY. GENERIC EQUIVALENT FOR LOPRESSOR.  . tamsulosin (FLOMAX) 0.4 MG CAPS capsule Take 0.4 mg by mouth daily.     Allergies:   Patient has no known allergies.   Social History   Tobacco Use  . Smoking status: Former Smoker    Packs/day: 0.50    Years: 50.00    Pack years: 25.00    Types: Cigarettes    Start  date: 07/03/1929    Quit date: 07/04/1979    Years since quitting: 39.9  . Smokeless tobacco: Never Used  Substance Use Topics  . Alcohol use: No    Alcohol/week: 0.0 standard drinks  . Drug use: No     Family Hx: The patient's family history is not on file.  ROS:   Please see the history of present illness.     All other systems reviewed and are negative.   Prior CV studies:   The following studies were reviewed today:  NA   Labs/Other Tests and Data Reviewed:    EKG:  No ECG reviewed.  Recent Labs: No results found for requested labs within last 8760 hours.   Recent Lipid Panel No results found for: CHOL, TRIG, HDL, CHOLHDL, LDLCALC, LDLDIRECT  Wt Readings from Last 3 Encounters:  05/31/19 152 lb (68.9 kg)  05/10/18 169 lb (76.7 kg)  05/24/17 162 lb (73.5 kg)     Objective:    Vital Signs:  BP (!) 104/57   Pulse 80   Ht 5\' 11"  (1.803 m)   Wt 152 lb (68.9 kg)   BMI 21.20 kg/m    VITAL SIGNS:  reviewed  ASSESSMENT & PLAN:    1.  Paroxysmalatrial fibrillation: Symptomatically stable. Continue metoprolol 12.5 mg twice daily. For now continue aspirin although no clear benefit in terms of thromboembolic risk reduction. We previously discussed anticoagulation therapy but he declined.  2. Essential QQV:ZDGLOVFIEP. No changes.  3. Bilateral leg edema: Controlled on Lasix 20 mg daily. No changes.    COVID-19 Education: The signs and symptoms of COVID-19 were discussed with the patient and how to seek care for testing (follow up with PCP or arrange E-visit).  The importance of social distancing was discussed today.  Time:   Today, I have spent 5 minutes with the patient with telehealth technology discussing the above problems.     Medication Adjustments/Labs and Tests Ordered: Current medicines are reviewed at length with the patient today.  Concerns regarding medicines are outlined above.   Tests Ordered: No orders of the defined types were placed in this encounter.   Medication Changes: No orders of the defined types were placed in this encounter.   Follow Up:  Virtual Visit in 1 year(s)  Signed, Kate Sable, MD  05/31/2019 12:59 PM    [place]

## 2019-05-31 NOTE — Patient Instructions (Signed)

## 2019-09-17 ENCOUNTER — Telehealth (INDEPENDENT_AMBULATORY_CARE_PROVIDER_SITE_OTHER): Payer: Medicare Other | Admitting: Cardiovascular Disease

## 2019-09-17 ENCOUNTER — Encounter: Payer: Self-pay | Admitting: Cardiovascular Disease

## 2019-09-17 VITALS — BP 134/66 | HR 77 | Ht 71.0 in | Wt 168.0 lb

## 2019-09-17 DIAGNOSIS — R0600 Dyspnea, unspecified: Secondary | ICD-10-CM

## 2019-09-17 DIAGNOSIS — I10 Essential (primary) hypertension: Secondary | ICD-10-CM

## 2019-09-17 DIAGNOSIS — I5031 Acute diastolic (congestive) heart failure: Secondary | ICD-10-CM

## 2019-09-17 DIAGNOSIS — I480 Paroxysmal atrial fibrillation: Secondary | ICD-10-CM | POA: Diagnosis not present

## 2019-09-17 DIAGNOSIS — R600 Localized edema: Secondary | ICD-10-CM

## 2019-09-17 DIAGNOSIS — R0609 Other forms of dyspnea: Secondary | ICD-10-CM

## 2019-09-17 MED ORDER — FUROSEMIDE 40 MG PO TABS: ORAL_TABLET | ORAL | 3 refills | Status: DC

## 2019-09-17 MED ORDER — POTASSIUM CHLORIDE CRYS ER 20 MEQ PO TBCR: 20.0000 meq | EXTENDED_RELEASE_TABLET | Freq: Every day | ORAL | 3 refills | Status: DC

## 2019-09-17 NOTE — Patient Instructions (Addendum)
Medication Instructions:  INCREASE Lasix to 40 mg twice a day for 3 days, then, decrease to 40 mg daily  START Potassium 20 meq daily *If you need a refill on your cardiac medications before your next appointment, please call your pharmacy*  Lab Work: BMET, BNP  If you have labs (blood work) drawn today and your tests are completely normal, you will receive your results only by: Marland Kitchen MyChart Message (if you have MyChart) OR . A paper copy in the mail If you have any lab test that is abnormal or we need to change your treatment, we will call you to review the results.  Testing/Procedures: Your physician has requested that you have an echocardiogram. Echocardiography is a painless test that uses sound waves to create images of your heart. It provides your doctor with information about the size and shape of your heart and how well your heart's chambers and valves are working. This procedure takes approximately one hour. There are no restrictions for this procedure.  Get a chest x-ray on the day of your echo.Go to radiology dept, no apt needed.  Follow-Up: At [facility], you and your health needs are our priority.  As part of our continuing mission to provide you with exceptional heart care, we have created designated Provider Care Teams.  These Care Teams include your primary Cardiologist (physician) and Advanced Practice Providers (APPs -  Physician Assistants and Nurse Practitioners) who all work together to provide you with the care you need, when you need it.  Your next appointment:   2 week(s)  The format for your next appointment:   Virtual Visit   Provider:   Kate Sable, MD  Other Instructions Weigh self daily every morning.     Thank you for choosing [facility] !

## 2019-09-17 NOTE — Progress Notes (Addendum)
Virtual Visit via Telephone Note   This visit type was conducted due to national recommendations for restrictions regarding the COVID-19 Pandemic (e.g. social distancing) in an effort to limit this patient's exposure and mitigate transmission in our community.  Due to his co-morbid illnesses, this patient is at least at moderate risk for complications without adequate follow up.  This format is felt to be most appropriate for this patient at this time.  The patient did not have access to video technology/had technical difficulties with video requiring transitioning to audio format only (telephone).  All issues noted in this document were discussed and addressed.  No physical exam could be performed with this format.  Please refer to the patient's chart for his  consent to telehealth for [facility].   Date:  09/17/2019   ID:  Nathan Kim, DOB 23-Feb-1920, MRN [id number]  Patient Location: Home Provider Location: Office  PCP:  [name], MD  Cardiologist:  Kate Sable, MD  Electrophysiologist:  None   Evaluation Performed:  Follow-Up Visit  Chief Complaint:  SOB and dizziness  History of Present Illness:    Nathan Kim is a 85 y.o. male with a history of hypertension, bilateral leg edema,and paroxysmal atrial fibrillation.   Echocardiogramperformed in February 2018 at an outside facility demonstrated normal left ventricular systolic function, EF 60 to 65%, mild LVH, and mildly elevated pulmonary pressures.  I spoke with the patient and his daughter, [name], by speaker phone.  He has been having dizziness and shortness of breath for several weeks.  He denies chest pain and palpitations.  His daughter said he pants when turning over.  His weight is 168 lbs. Wt was 152 lbs on 05/31/19.  He complains of leg and feet swelling. He is wearing compression stockings.   Past Medical History:  Diagnosis Date  . Arthritis   . BPH (benign prostatic hyperplasia)     . Hypertension   . Phlebitis 10/2016   Past Surgical History:  Procedure Laterality Date  . BACK SURGERY    . carpel tunnel    . EYE SURGERY    . HERNIA REPAIR    . LUMBAR LAMINECTOMY/DECOMPRESSION MICRODISCECTOMY N/A 02/27/2016   Procedure: LUMBAR LAMINECTOMY/DECOMPRESSION MICRODISCECTOMY 3 LEVELS;  Surgeon: Kristeen Miss, MD;  Location: [place] [facility];  Service: Neurosurgery;  Laterality: N/A;  L2-3 L3-4 L4-5 Laminectomy  . TOTAL HIP ARTHROPLASTY Right 05/24/2017   Procedure: RIGHT TOTAL HIP ARTHROPLASTY ANTERIOR APPROACH;  Surgeon: Paralee Cancel, MD;  Location: [facility];  Service: Orthopedics;  Laterality: Right;  70 mins     Current Meds  Medication Sig  . amLODipine (NORVASC) 2.5 MG tablet Take 2.5 mg by mouth every evening.   Marland Kitchen aspirin EC 81 MG tablet Take 81 mg by mouth every other day.   . furosemide (LASIX) 20 MG tablet Take 1 tablet (20 mg total) by mouth every other day. (Patient taking differently: Take 20 mg by mouth every other day. In the evening)  . lisinopril (PRINIVIL,ZESTRIL) 20 MG tablet Take 20 mg by mouth daily.   . metoprolol tartrate (LOPRESSOR) 25 MG tablet TAKE ONE-HALF TABLET BY MOUTH TWICE DAILY. GENERIC EQUIVALENT FOR LOPRESSOR.  . tamsulosin (FLOMAX) 0.4 MG CAPS capsule Take 0.4 mg by mouth daily.     Allergies:   Patient has no known allergies.   Social History   Tobacco Use  . Smoking status: Former Smoker    Packs/day: 0.50    Years: 50.00    Pack years: 25.00  Types: Cigarettes    Start date: 07/03/1929    Quit date: 07/04/1979    Years since quitting: 40.2  . Smokeless tobacco: Never Used  Substance Use Topics  . Alcohol use: No    Alcohol/week: 0.0 standard drinks  . Drug use: No     Family Hx: The patient's family history is not on file.  ROS:   Please see the history of present illness.     All other systems reviewed and are negative.   Prior CV studies:   The following studies were reviewed today:  Reviewed above  Labs/Other  Tests and Data Reviewed:    EKG:  No ECG reviewed.  Recent Labs: No results found for requested labs within last 8760 hours.   Recent Lipid Panel No results found for: CHOL, TRIG, HDL, CHOLHDL, LDLCALC, LDLDIRECT  Wt Readings from Last 3 Encounters:  09/17/19 168 lb (76.2 kg)  05/31/19 152 lb (68.9 kg)  05/10/18 169 lb (76.7 kg)     Objective:    Vital Signs:  BP 134/66   Pulse 77   Ht 5\' 11"  (1.803 m)   Wt 168 lb (76.2 kg)   BMI 23.43 kg/m    VITAL SIGNS:  reviewed  ASSESSMENT & PLAN:    1.Paroxysmalatrial fibrillation: Symptomatically stable. Continue metoprolol 12.5 mg twice daily. For now continue aspirin although no clear benefit in terms of thromboembolic risk reduction. We previously discussed anticoagulation therapy but he declined.  2. Essential ZHG:DJMEQASTMH.  This will need further monitoring given increased diuretic requirement (see #4).  3. Shortness of breath and leg swelling: He likely has decompensated diastolic heart failure.  Currently takes Lasix 20 mg every other day.  He has had increasing exertional dyspnea and bilateral leg edema over the last several weeks.  Weight was 152 pounds on 05/31/2019 and he weighs 168 pounds today.  I will have him take Lasix 40 mg twice daily for the next 3 days followed by 40 mg daily.  I will prescribe supplemental potassium 20 mEq daily.  I will check a basic metabolic panel and BNP.  I will obtain a chest x-ray and echocardiogram.  I asked him to weigh daily.    COVID-19 Education: The signs and symptoms of COVID-19 were discussed with the patient and how to seek care for testing (follow up with PCP or arrange E-visit).  The importance of social distancing was discussed today.  Time:   Today, I have spent 25 minutes with the patient with telehealth technology discussing the above problems.     Medication Adjustments/Labs and Tests Ordered: Current medicines are reviewed at length with the patient today.   Concerns regarding medicines are outlined above.   Tests Ordered: No orders of the defined types were placed in this encounter.   Medication Changes: No orders of the defined types were placed in this encounter.   Follow Up:  Virtual Visit  in 2 week(s)  Signed, Kate Sable, MD  09/17/2019 2:18 PM    [place]

## 2019-09-17 NOTE — Addendum Note (Signed)
Addended by: Barbarann Ehlers A on: 09/17/2019 02:45 PM   Modules accepted: Orders

## 2019-09-17 NOTE — Addendum Note (Signed)
Addended by: Barbarann Ehlers A on: 09/17/2019 02:56 PM   Modules accepted: Orders

## 2019-09-18 ENCOUNTER — Telehealth: Payer: Self-pay | Admitting: Cardiovascular Disease

## 2019-09-18 NOTE — Telephone Encounter (Signed)
Noted-cc 

## 2019-09-18 NOTE — Telephone Encounter (Signed)
Daughter [name] called back saying pt changed his mind on the potassium pills and that he will just keep them.

## 2019-09-18 NOTE — Telephone Encounter (Signed)
Pt's daughter [name] did not get the potassium pills because they are to big for the pt to swallow and would like to have the liquid if possible.

## 2019-09-21 ENCOUNTER — Other Ambulatory Visit: Payer: Self-pay

## 2019-09-21 ENCOUNTER — Ambulatory Visit (HOSPITAL_COMMUNITY)
Admission: RE | Admit: 2019-09-21 | Discharge: 2019-09-21 | Disposition: A | Discharge status: Home / Self Care | Payer: Medicare Other | Source: Ambulatory Visit | Attending: Cardiovascular Disease | Admitting: Cardiovascular Disease

## 2019-09-21 ENCOUNTER — Other Ambulatory Visit (HOSPITAL_COMMUNITY)
Admission: RE | Admit: 2019-09-21 | Discharge: 2019-09-21 | Disposition: A | Discharge status: Home / Self Care | Payer: Medicare Other | Source: Ambulatory Visit | Attending: Cardiovascular Disease | Admitting: Cardiovascular Disease

## 2019-09-21 ENCOUNTER — Ambulatory Visit (HOSPITAL_COMMUNITY): Payer: Medicare Other

## 2019-09-21 ENCOUNTER — Encounter (HOSPITAL_COMMUNITY): Payer: Self-pay

## 2019-09-21 DIAGNOSIS — R600 Localized edema: Secondary | ICD-10-CM | POA: Insufficient documentation

## 2019-09-21 DIAGNOSIS — R0600 Dyspnea, unspecified: Secondary | ICD-10-CM

## 2019-09-21 DIAGNOSIS — R0609 Other forms of dyspnea: Secondary | ICD-10-CM

## 2019-09-21 LAB — BASIC METABOLIC PANEL
Anion gap: 7 (ref 5–15)
BUN: 46 mg/dL — ABNORMAL HIGH (ref 8–23)
CO2: 25 mmol/L (ref 22–32)
Calcium: 8.8 mg/dL — ABNORMAL LOW (ref 8.9–10.3)
Chloride: 108 mmol/L (ref 98–111)
Creatinine, Ser: 2.29 mg/dL — ABNORMAL HIGH (ref 0.61–1.24)
GFR calc Af Amer: 27 mL/min — ABNORMAL LOW (ref >60)
GFR calc non Af Amer: 23 mL/min — ABNORMAL LOW (ref >60)
Glucose, Bld: 124 mg/dL — ABNORMAL HIGH (ref 70–99)
Potassium: 5.3 mmol/L — ABNORMAL HIGH (ref 3.5–5.1)
Sodium: 140 mmol/L (ref 135–145)

## 2019-09-21 LAB — BRAIN NATRIURETIC PEPTIDE: B Natriuretic Peptide: 363 pg/mL — ABNORMAL HIGH (ref 0.0–100.0)

## 2019-09-24 ENCOUNTER — Telehealth: Payer: Self-pay

## 2019-09-24 DIAGNOSIS — Z79899 Other long term (current) drug therapy: Secondary | ICD-10-CM

## 2019-09-24 NOTE — Telephone Encounter (Signed)
I spoke with wife.patient has lost 10 lbs and no longer has swelling.She will hold the potassium for 3 days and then resume.His lasix is 40 mg daily

## 2019-09-24 NOTE — Telephone Encounter (Signed)
-----   Message from [name], MD sent at 09/24/2019  9:22 AM EST ----- Has weight gone down as well as leg swelling?  Renal dysfunction is noted due to increased diuretic requirement.  Hold potassium for 3 days and then resume.  Repeat basic metabolic panel in 2 weeks.  He should now be on Lasix 40 mg daily.

## 2019-10-02 ENCOUNTER — Encounter: Payer: Self-pay | Admitting: Student

## 2019-10-02 ENCOUNTER — Telehealth (INDEPENDENT_AMBULATORY_CARE_PROVIDER_SITE_OTHER): Payer: Medicare Other | Admitting: Student

## 2019-10-02 VITALS — BP 132/61 | HR 75 | Ht 71.0 in | Wt 154.0 lb

## 2019-10-02 DIAGNOSIS — Z79899 Other long term (current) drug therapy: Secondary | ICD-10-CM

## 2019-10-02 DIAGNOSIS — I110 Hypertensive heart disease with heart failure: Secondary | ICD-10-CM

## 2019-10-02 DIAGNOSIS — I480 Paroxysmal atrial fibrillation: Secondary | ICD-10-CM

## 2019-10-02 DIAGNOSIS — I10 Essential (primary) hypertension: Secondary | ICD-10-CM

## 2019-10-02 DIAGNOSIS — I5031 Acute diastolic (congestive) heart failure: Secondary | ICD-10-CM

## 2019-10-02 DIAGNOSIS — N179 Acute kidney failure, unspecified: Secondary | ICD-10-CM

## 2019-10-02 NOTE — Patient Instructions (Signed)
Medication Instructions:  Your physician recommends that you continue on your current medications as directed. Please refer to the Current Medication list given to you today.  *If you need a refill on your cardiac medications before your next appointment, please call your pharmacy*  Lab Work: BMET THIS Thursday  10/04/2019 If you have labs (blood work) drawn today and your tests are completely normal, you will receive your results only by: Marland Kitchen MyChart Message (if you have MyChart) OR . A paper copy in the mail If you have any lab test that is abnormal or we need to change your treatment, we will call you to review the results.  Testing/Procedures: None today  Follow-Up: At [facility], you and your health needs are our priority.  As part of our continuing mission to provide you with exceptional heart care, we have created designated Provider Care Teams.  These Care Teams include your primary Cardiologist (physician) and Advanced Practice Providers (APPs -  Physician Assistants and Nurse Practitioners) who all work together to provide you with the care you need, when you need it.  Your next appointment:   2- 3  month(s)  The format for your next appointment:   Virtual Visit   Provider:   Kate Sable, MD  Other Instructions None     Thank you for choosing [facility] !

## 2019-10-02 NOTE — Progress Notes (Signed)
Virtual Visit via Telephone Note   This visit type was conducted due to national recommendations for restrictions regarding the COVID-19 Pandemic (e.g. social distancing) in an effort to limit this patient's exposure and mitigate transmission in our community.  Due to his co-morbid illnesses, this patient is at least at moderate risk for complications without adequate follow up.  This format is felt to be most appropriate for this patient at this time.  The patient did not have access to video technology/had technical difficulties with video requiring transitioning to audio format only (telephone).  All issues noted in this document were discussed and addressed.  No physical exam could be performed with this format.  Please refer to the patient's chart for his  consent to telehealth for [facility].   Date:  10/02/2019   ID:  Nathan Kim, DOB June 07, 1920, MRN [id number]  Patient Location: Home Provider Location: Office  PCP:  [name], MD  Cardiologist:  Kate Sable, MD  Electrophysiologist:  None   Evaluation Performed:  Follow-Up Visit  Chief Complaint: 2 week visit  History of Present Illness:    Nathan Kim is a 85 y.o. male with past medical history of paroxysmal atrial fibrillation, HTN and hypertensive heart disease who presents for a 2-week follow-up telehealth visit.   He most recently had a phone visit with Dr. Bronson Ing on 09/17/2019 and reported having worsening dyspnea on exertion with associated dyspnea for the past several weeks. He denied any chest pain or palpitations. Weight was elevated to 168 lbs and this had previously been at 152 lbs in 05/2019.  It was thought that his symptoms of dyspnea and weight gain were likely secondary to decompensated diastolic heart failure. He was taking Lasix 20 mg every other day and it was recommended he take Lasix 40 mg twice daily for the next 3 days then starting 40 mg daily. Follow-up lab work was obtained and  showed BNP was elevated at 363 and creatinine had trended upwards from 1.50 in 2019 to 2.29. K+ was elevated at 5.3.  CXR showed a small right pleural effusion. It was recommended that he have repeat labs in 2 weeks. A follow-up echocardiogram showed a preserved EF of 55 to 60% with moderately increased LVH and trivial MR and mild aortic valve sclerosis without stenosis.  In talking with the patient today, he reports significant improvement in his dyspnea and lower extremity edema since his last visit. Weight has declined by over 14 pounds on his home scale since 2 weeks ago.  He feels like his breathing is back to baseline and he denies any specific orthopnea or PND. No recurrent lower extremity edema. He is utilizing compression stockings on a daily basis. No recent chest pain or palpitations.  He does not add extra salt to his food and reports his daughter does most of the cooking and does not add additional salt.   The patient does not have symptoms concerning for COVID-19 infection (fever, chills, cough, or new shortness of breath).    Past Medical History:  Diagnosis Date  . Arthritis   . BPH (benign prostatic hyperplasia)   . Hypertension   . Phlebitis 10/2016   Past Surgical History:  Procedure Laterality Date  . BACK SURGERY    . carpel tunnel    . EYE SURGERY    . HERNIA REPAIR    . LUMBAR LAMINECTOMY/DECOMPRESSION MICRODISCECTOMY N/A 02/27/2016   Procedure: LUMBAR LAMINECTOMY/DECOMPRESSION MICRODISCECTOMY 3 LEVELS;  Surgeon: Kristeen Miss, MD;  Location: [facility];  Service: Neurosurgery;  Laterality: N/A;  L2-3 L3-4 L4-5 Laminectomy  . TOTAL HIP ARTHROPLASTY Right 05/24/2017   Procedure: RIGHT TOTAL HIP ARTHROPLASTY ANTERIOR APPROACH;  Surgeon: Paralee Cancel, MD;  Location: [facility];  Service: Orthopedics;  Laterality: Right;  70 mins     Current Meds  Medication Sig  . amLODipine (NORVASC) 2.5 MG tablet Take 2.5 mg by mouth every evening.   Marland Kitchen aspirin EC 81 MG tablet Take 81  mg by mouth every other day.   . furosemide (LASIX) 40 MG tablet Starting 09/17/2019 take  40 mg twice a day for 3 days, THEN, decrease to 40 mg daily  . lisinopril (PRINIVIL,ZESTRIL) 20 MG tablet Take 20 mg by mouth daily.   . metoprolol tartrate (LOPRESSOR) 25 MG tablet TAKE ONE-HALF TABLET BY MOUTH TWICE DAILY. GENERIC EQUIVALENT FOR LOPRESSOR.  Marland Kitchen potassium chloride SA (KLOR-CON) 20 MEQ tablet Take 1 tablet (20 mEq total) by mouth daily.  . tamsulosin (FLOMAX) 0.4 MG CAPS capsule Take 0.4 mg by mouth daily.     Allergies:   Patient has no known allergies.   Social History   Tobacco Use  . Smoking status: Former Smoker    Packs/day: 0.50    Years: 50.00    Pack years: 25.00    Types: Cigarettes    Start date: 07/03/1929    Quit date: 07/04/1979    Years since quitting: 40.2  . Smokeless tobacco: Never Used  Substance Use Topics  . Alcohol use: No    Alcohol/week: 0.0 standard drinks  . Drug use: No     Family Hx: The patient's family history is not on file.  ROS:   Please see the history of present illness.     All other systems reviewed and are negative.   Prior CV studies:   The following studies were reviewed today:  Echocardiogram: 09/2019 IMPRESSIONS    1. Left ventricular ejection fraction, by visual estimation, is 55 to 60%. The left ventricle has normal function. There is moderately increased left ventricular hypertrophy. Biplane LVEF and global longitudinal strain measurements do not appear to be  accurate.  2. Left ventricular diastolic parameters are indeterminate.  3. The left ventricle has no regional wall motion abnormalities.  4. Global right ventricle has normal systolic function.The right ventricular size is normal. No increase in right ventricular wall thickness.  5. Left atrial size was normal.  6. Right atrial size was upper normal.  7. Mild mitral annular calcification.  8. The mitral valve is grossly normal. Trivial mitral valve  regurgitation.  9. The tricuspid valve is grossly normal. Tricuspid valve regurgitation is trivial. 10. The aortic valve is tricuspid. Aortic valve regurgitation is not visualized. Mild aortic valve sclerosis without stenosis. 11. The pulmonic valve was grossly normal. Pulmonic valve regurgitation is trivial. 12. Normal pulmonary artery systolic pressure. 13. The inferior vena cava is normal in size with <50% respiratory variability, suggesting right atrial pressure of 8 mmHg. 14. The tricuspid regurgitant velocity is 2.15 m/s, and with an assumed right atrial pressure of 8 mmHg, the estimated right ventricular systolic pressure is normal at 26.5 mmHg.  CXR: 09/21/2019 FINDINGS: The heart size and mediastinal contours are within normal limits. Atherosclerotic calcification of the aortic arch. Normal pulmonary vascularity. Small right pleural effusion with adjacent mild atelectasis. No pneumothorax. No acute osseous abnormality.  IMPRESSION: Small right pleural effusion.  Labs/Other Tests and Data Reviewed:    EKG:  No ECG reviewed.  Recent Labs: 09/21/2019: B Natriuretic  Peptide 363.0; BUN 46; Creatinine, Ser 2.29; Potassium 5.3; Sodium 140   Recent Lipid Panel No results found for: CHOL, TRIG, HDL, CHOLHDL, LDLCALC, LDLDIRECT  Wt Readings from Last 3 Encounters:  10/02/19 154 lb (69.9 kg)  09/17/19 168 lb (76.2 kg)  05/31/19 152 lb (68.9 kg)     Objective:    Vital Signs:  BP 132/61   Pulse 75   Ht 5\' 11"  (1.803 m)   Wt 154 lb (69.9 kg)   BMI 21.48 kg/m    General: Pleasant male sounding in NAD Psych: Normal affect. Neuro: Alert and oriented X 3. Lungs:  Resp regular and unlabored while talking on the phone.   ASSESSMENT & PLAN:    1. Hypertensive Heart Disease - Most recent echocardiogram showed a preserved EF of 55 to 60% with moderate LVH.  He was previously having issues with dyspnea on exertion and edema but this improved with titration of his diuretic  regimen. He has lost over 14 pounds within the past 2 weeks and weight is now close to baseline (peaked at 168 lbs, now at 154 lbs and previous baseline of 152 lbs). Will recheck BMET this week. If renal function has normalized, will continue at current dosing. If creatinine remains elevated, would reduce dosing to 20mg  daily.   2. Paroxysmal Atrial Fibrillation - He denies any recent palpitations and heart rate has been well controlled in the 70's when checked at home. Continue Lopressor 12.5 mg twice daily. - he has declined anticoagulation in the past given his advanced age. Remains on ASA 81mg  daily.   3. HTN - BP was well-controlled at 132/61 when checked earlier today. Continue current regimen with Lopressor 12.5mg  BID and Lisinopril 20mg  daily.   4. AKI - creatinine previously 1.50 in 11/2017. Peaked at 2.29 on 09/21/2019 following recent dose titration of Lasix. Recheck BMET this week.    COVID-19 Education: The signs and symptoms of COVID-19 were discussed with the patient and how to seek care for testing (follow up with PCP or arrange E-visit).  The importance of social distancing was discussed today.  Time:   Today, I have spent 14 minutes with the patient with telehealth technology discussing the above problems.     Medication Adjustments/Labs and Tests Ordered: Current medicines are reviewed at length with the patient today.  Concerns regarding medicines are outlined above.   Tests Ordered: Orders Placed This Encounter  Procedures  . Basic Metabolic Panel (BMET)    Medication Changes: No orders of the defined types were placed in this encounter.   Follow Up:  Virtual Visit  in 3 month(s)  Signed, Erma Heritage, PA-C  10/02/2019 5:13 PM    [facility]

## 2019-10-04 ENCOUNTER — Telehealth: Payer: Self-pay

## 2019-10-04 ENCOUNTER — Other Ambulatory Visit: Payer: Self-pay

## 2019-10-04 ENCOUNTER — Other Ambulatory Visit (HOSPITAL_COMMUNITY)
Admission: RE | Admit: 2019-10-04 | Discharge: 2019-10-04 | Disposition: A | Discharge status: Home / Self Care | Payer: Medicare Other | Source: Ambulatory Visit | Attending: Student | Admitting: Student

## 2019-10-04 DIAGNOSIS — Z79899 Other long term (current) drug therapy: Secondary | ICD-10-CM

## 2019-10-04 LAB — BASIC METABOLIC PANEL
Anion gap: 11 (ref 5–15)
BUN: 63 mg/dL — ABNORMAL HIGH (ref 8–23)
CO2: 25 mmol/L (ref 22–32)
Calcium: 9.3 mg/dL (ref 8.9–10.3)
Chloride: 104 mmol/L (ref 98–111)
Creatinine, Ser: 2.72 mg/dL — ABNORMAL HIGH (ref 0.61–1.24)
GFR calc Af Amer: 22 mL/min — ABNORMAL LOW (ref >60)
GFR calc non Af Amer: 19 mL/min — ABNORMAL LOW (ref >60)
Glucose, Bld: 148 mg/dL — ABNORMAL HIGH (ref 70–99)
Potassium: 5.6 mmol/L — ABNORMAL HIGH (ref 3.5–5.1)
Sodium: 140 mmol/L (ref 135–145)

## 2019-10-04 MED ORDER — FUROSEMIDE 20 MG PO TABS: ORAL_TABLET | ORAL | 3 refills | Status: DC

## 2019-10-04 NOTE — Telephone Encounter (Signed)
I spoke with daughter [name], they will hold lasix for 3 days, the restart at 20 mg qd, and stop potassium.I will fax lab slip to [facility] lab

## 2019-10-04 NOTE — Telephone Encounter (Signed)
-----   Message from Erma Heritage, [place] sent at 10/04/2019  1:07 PM EST ----- Please let the patient know his renal function has again worsened with creatinine going from 2.29 to 2.72. HOLD Lasix for 3 days then resume at a lower dose of 20mg  daily. K+ also remains elevated. STOP K-dur for now. Repeat BMET next Monday or Tuesday.

## 2019-10-08 ENCOUNTER — Other Ambulatory Visit: Payer: Self-pay

## 2019-10-08 ENCOUNTER — Other Ambulatory Visit (HOSPITAL_COMMUNITY)
Admission: RE | Admit: 2019-10-08 | Discharge: 2019-10-08 | Disposition: A | Discharge status: Home / Self Care | Payer: Medicare Other | Source: Ambulatory Visit | Attending: Cardiovascular Disease | Admitting: Cardiovascular Disease

## 2019-10-08 DIAGNOSIS — Z79899 Other long term (current) drug therapy: Secondary | ICD-10-CM | POA: Insufficient documentation

## 2019-10-08 LAB — BASIC METABOLIC PANEL
Anion gap: 10 (ref 5–15)
BUN: 58 mg/dL — ABNORMAL HIGH (ref 8–23)
CO2: 21 mmol/L — ABNORMAL LOW (ref 22–32)
Calcium: 8.8 mg/dL — ABNORMAL LOW (ref 8.9–10.3)
Chloride: 108 mmol/L (ref 98–111)
Creatinine, Ser: 2.37 mg/dL — ABNORMAL HIGH (ref 0.61–1.24)
GFR calc Af Amer: 26 mL/min — ABNORMAL LOW (ref >60)
GFR calc non Af Amer: 22 mL/min — ABNORMAL LOW (ref >60)
Glucose, Bld: 149 mg/dL — ABNORMAL HIGH (ref 70–99)
Potassium: 5.1 mmol/L (ref 3.5–5.1)
Sodium: 139 mmol/L (ref 135–145)

## 2019-10-09 ENCOUNTER — Telehealth: Payer: Self-pay

## 2019-10-09 DIAGNOSIS — Z79899 Other long term (current) drug therapy: Secondary | ICD-10-CM

## 2019-10-09 MED ORDER — FUROSEMIDE 20 MG PO TABS: ORAL_TABLET | ORAL | 3 refills | Status: DC

## 2019-10-09 NOTE — Telephone Encounter (Signed)
Nathan Heritage, PA-C  10/09/2019 1:37 PM EST    Please let the patient know his potassium level has improved and renal function is starting to trend in the right direction. Creatinine peaked at 2.72, now at 2.37. He should now be taking Lasix 20 mg daily. If weight has overall been stable, would recommend reducing to 20 mg every other day. If weight has increased within the past week, continue at current dosing of 20 mg daily. Repeat BMET in 2 weeks.         I spoke with both patient and daughter.Will take lasix 20 mg QOD and repeat bmet in 2 weeks.Mailed lab slip to daughter

## 2019-10-22 ENCOUNTER — Other Ambulatory Visit (HOSPITAL_COMMUNITY)
Admission: RE | Admit: 2019-10-22 | Discharge: 2019-10-22 | Disposition: A | Discharge status: Home / Self Care | Payer: Medicare Other | Source: Ambulatory Visit | Attending: Student | Admitting: Student

## 2019-10-22 DIAGNOSIS — Z79899 Other long term (current) drug therapy: Secondary | ICD-10-CM | POA: Diagnosis present

## 2019-10-22 LAB — BASIC METABOLIC PANEL
Anion gap: 7 (ref 5–15)
BUN: 64 mg/dL — ABNORMAL HIGH (ref 8–23)
CO2: 25 mmol/L (ref 22–32)
Calcium: 9.1 mg/dL (ref 8.9–10.3)
Chloride: 107 mmol/L (ref 98–111)
Creatinine, Ser: 2.26 mg/dL — ABNORMAL HIGH (ref 0.61–1.24)
GFR calc Af Amer: 27 mL/min — ABNORMAL LOW (ref >60)
GFR calc non Af Amer: 23 mL/min — ABNORMAL LOW (ref >60)
Glucose, Bld: 149 mg/dL — ABNORMAL HIGH (ref 70–99)
Potassium: 4.9 mmol/L (ref 3.5–5.1)
Sodium: 139 mmol/L (ref 135–145)

## 2019-10-25 ENCOUNTER — Encounter: Payer: Self-pay | Admitting: *Deleted

## 2019-12-03 ENCOUNTER — Telehealth (INDEPENDENT_AMBULATORY_CARE_PROVIDER_SITE_OTHER): Payer: Medicare Other | Admitting: Cardiovascular Disease

## 2019-12-03 ENCOUNTER — Encounter: Payer: Self-pay | Admitting: Cardiovascular Disease

## 2019-12-03 VITALS — BP 106/47 | HR 81 | Ht 71.0 in | Wt 162.0 lb

## 2019-12-03 DIAGNOSIS — I130 Hypertensive heart and chronic kidney disease with heart failure and stage 1 through stage 4 chronic kidney disease, or unspecified chronic kidney disease: Secondary | ICD-10-CM

## 2019-12-03 DIAGNOSIS — I110 Hypertensive heart disease with heart failure: Secondary | ICD-10-CM

## 2019-12-03 DIAGNOSIS — I480 Paroxysmal atrial fibrillation: Secondary | ICD-10-CM

## 2019-12-03 DIAGNOSIS — N184 Chronic kidney disease, stage 4 (severe): Secondary | ICD-10-CM

## 2019-12-03 DIAGNOSIS — I5032 Chronic diastolic (congestive) heart failure: Secondary | ICD-10-CM

## 2019-12-03 DIAGNOSIS — I10 Essential (primary) hypertension: Secondary | ICD-10-CM

## 2019-12-03 NOTE — Patient Instructions (Signed)
Medication Instructions:  Continue all current medications.   Labwork: none  Testing/Procedures: none  Follow-Up: 6 months   Any Other Special Instructions Will Be Listed Below (If Applicable).   If you need a refill on your cardiac medications before your next appointment, please call your pharmacy.  

## 2019-12-03 NOTE — Progress Notes (Signed)
Virtual Visit via Telephone Note   This visit type was conducted due to national recommendations for restrictions regarding the COVID-19 Pandemic (e.g. social distancing) in an effort to limit this patient's exposure and mitigate transmission in our community.  Due to his co-morbid illnesses, this patient is at least at moderate risk for complications without adequate follow up.  This format is felt to be most appropriate for this patient at this time.  The patient did not have access to video technology/had technical difficulties with video requiring transitioning to audio format only (telephone).  All issues noted in this document were discussed and addressed.  No physical exam could be performed with this format.  Please refer to the patient's chart for his  consent to telehealth for [facility].   Date:  12/03/2019   ID:  Nathan Kim, DOB 1920-09-08, MRN [id number]  Patient Location: Home Provider Location: Office  PCP:  [name], MD  Cardiologist:  Kate Sable, MD  Electrophysiologist:  None   Evaluation Performed:  Follow-Up Visit  Chief Complaint: Chronic diastolic heart failure, paroxysmal atrial fibrillation  History of Present Illness:    Nathan Kim is a 86 y.o. male with a history of chronic diastolic heart failure, hypertension, and paroxysmal atrial fibrillation.  He also has chronic kidney disease stage IV.  He currently denies any chest pain and palpitations.  He also denies leg swelling.  He has sporadic exertional dyspnea but this remained stable.  He is taking Lasix 20 mg daily.  His daughter, [name], could be heard in the background.   Past Medical History:  Diagnosis Date  . Arthritis   . BPH (benign prostatic hyperplasia)   . Hypertension   . Phlebitis 10/2016   Past Surgical History:  Procedure Laterality Date  . BACK SURGERY    . carpel tunnel    . EYE SURGERY    . HERNIA REPAIR    . LUMBAR LAMINECTOMY/DECOMPRESSION  MICRODISCECTOMY N/A 02/27/2016   Procedure: LUMBAR LAMINECTOMY/DECOMPRESSION MICRODISCECTOMY 3 LEVELS;  Surgeon: Kristeen Miss, MD;  Location: [facility];  Service: Neurosurgery;  Laterality: N/A;  L2-3 L3-4 L4-5 Laminectomy  . TOTAL HIP ARTHROPLASTY Right 05/24/2017   Procedure: RIGHT TOTAL HIP ARTHROPLASTY ANTERIOR APPROACH;  Surgeon: Paralee Cancel, MD;  Location: [facility];  Service: Orthopedics;  Laterality: Right;  70 mins     Current Meds  Medication Sig  . amLODipine (NORVASC) 2.5 MG tablet Take 2.5 mg by mouth every evening.   Marland Kitchen aspirin EC 81 MG tablet Take 81 mg by mouth every other day.   . furosemide (LASIX) 20 MG tablet Take 20 mg by mouth daily.  Marland Kitchen lisinopril (PRINIVIL,ZESTRIL) 20 MG tablet Take 20 mg by mouth daily.   . metoprolol tartrate (LOPRESSOR) 25 MG tablet TAKE ONE-HALF TABLET BY MOUTH TWICE DAILY. GENERIC EQUIVALENT FOR LOPRESSOR.  . tamsulosin (FLOMAX) 0.4 MG CAPS capsule Take 0.4 mg by mouth daily.  . traMADol (ULTRAM) 50 MG tablet Take 50 mg by mouth daily.     Allergies:   Patient has no known allergies.   Social History   Tobacco Use  . Smoking status: Former Smoker    Packs/day: 0.50    Years: 50.00    Pack years: 25.00    Types: Cigarettes    Start date: 07/03/1929    Quit date: 07/04/1979    Years since quitting: 40.4  . Smokeless tobacco: Never Used  Substance Use Topics  . Alcohol use: No    Alcohol/week: 0.0 standard drinks  .  Drug use: No     Family Hx: The patient's family history is not on file.  ROS:   Please see the history of present illness.     All other systems reviewed and are negative.   Prior CV studies:   The following studies were reviewed today:  Echocardiogram: 09/2019 IMPRESSIONS   1. Left ventricular ejection fraction, by visual estimation, is 55 to 60%. The left ventricle has normal function. There is moderately increased left ventricular hypertrophy. Biplane LVEF and global longitudinal strain measurements do not  appear to be  accurate. 2. Left ventricular diastolic parameters are indeterminate. 3. The left ventricle has no regional wall motion abnormalities. 4. Global right ventricle has normal systolic function.The right ventricular size is normal. No increase in right ventricular wall thickness. 5. Left atrial size was normal. 6. Right atrial size was upper normal. 7. Mild mitral annular calcification. 8. The mitral valve is grossly normal. Trivial mitral valve regurgitation. 9. The tricuspid valve is grossly normal. Tricuspid valve regurgitation is trivial. 10. The aortic valve is tricuspid. Aortic valve regurgitation is not visualized. Mild aortic valve sclerosis without stenosis. 11. The pulmonic valve was grossly normal. Pulmonic valve regurgitation is trivial. 12. Normal pulmonary artery systolic pressure. 13. The inferior vena cava is normal in size with <50% respiratory variability, suggesting right atrial pressure of 8 mmHg. 14. The tricuspid regurgitant velocity is 2.15 m/s, and with an assumed right atrial pressure of 8 mmHg, the estimated right ventricular systolic pressure is normal at 26.5 mmHg.  CXR: 09/21/2019 FINDINGS: The heart size and mediastinal contours are within normal limits. Atherosclerotic calcification of the aortic arch. Normal pulmonary vascularity. Small right pleural effusion with adjacent mild atelectasis. No pneumothorax. No acute osseous abnormality.  IMPRESSION: Small right pleural effusion.  Labs/Other Tests and Data Reviewed:    EKG:  No ECG reviewed.  Recent Labs: 09/21/2019: B Natriuretic Peptide 363.0 10/22/2019: BUN 64; Creatinine, Ser 2.26; Potassium 4.9; Sodium 139   Recent Lipid Panel No results found for: CHOL, TRIG, HDL, CHOLHDL, LDLCALC, LDLDIRECT  Wt Readings from Last 3 Encounters:  12/03/19 162 lb (73.5 kg)  10/02/19 154 lb (69.9 kg)  09/17/19 168 lb (76.2 kg)     Objective:    Vital Signs:  BP (!) 106/47   Pulse 81    Ht 5\' 11"  (1.803 m)   Wt 162 lb (73.5 kg)   BMI 22.59 kg/m    VITAL SIGNS:  reviewed  ASSESSMENT & PLAN:    1.  Chronic diastolic heart failure/hypertensive heart disease: Most recent echocardiogram demonstrated normal LV systolic function, EF 55 to 60%, with moderate LVH.  Continue furosemide 20 mg daily.  I instructed him to weigh daily and should he experience a 3 pound weight gain in 24 hours or 5 pounds in a week accompanied by shortness of breath, he has been instructed that he can take an extra dose of Lasix until weight returns to baseline.  2.  Paroxysmal atrial fibrillation: Symptomatically stable.  Continue Lopressor 12.5 mg twice daily.  He has declined anticoagulation in the past given his advanced age.  He remains on aspirin 81 mg daily.  3.  Hypertension: Blood pressure is normal.  No change to therapy.  4.  Chronic kidney disease stage IV: Creatinine 2.26 on 10/22/2019.  BUN 64.  Currently on Lasix 20 mg daily.    COVID-19 Education: The signs and symptoms of COVID-19 were discussed with the patient and how to seek care for testing (follow up  with PCP or arrange E-visit).  The importance of social distancing was discussed today.  Time:   Today, I have spent 15 minutes with the patient with telehealth technology discussing the above problems.     Medication Adjustments/Labs and Tests Ordered: Current medicines are reviewed at length with the patient today.  Concerns regarding medicines are outlined above.   Tests Ordered: No orders of the defined types were placed in this encounter.   Medication Changes: No orders of the defined types were placed in this encounter.   Follow Up:  Virtual Visit  in 6 month(s)  Signed, Kate Sable, MD  12/03/2019 11:02 AM    [place]

## 2019-12-28 ENCOUNTER — Other Ambulatory Visit: Payer: Self-pay | Admitting: Cardiology

## 2019-12-31 ENCOUNTER — Other Ambulatory Visit: Payer: Self-pay | Admitting: Cardiovascular Disease

## 2019-12-31 MED ORDER — FUROSEMIDE 20 MG PO TABS: 20.0000 mg | ORAL_TABLET | Freq: Every day | ORAL | 2 refills | Status: DC

## 2019-12-31 NOTE — Telephone Encounter (Signed)
*  STAT* If patient is at the pharmacy, call can be transferred to refill team.   1. Which medications need to be refilled?furosemide (LASIX) 20 MG tablet  2. Which pharmacy/location (including street and city if local pharmacy) is medication to be sent to? [facility] mail order  3. Do they need a 30 day or 90 day supply?

## 2020-06-03 ENCOUNTER — Encounter: Payer: Self-pay | Admitting: Cardiology

## 2020-06-03 ENCOUNTER — Other Ambulatory Visit: Payer: Self-pay | Admitting: *Deleted

## 2020-06-03 ENCOUNTER — Ambulatory Visit: Payer: Medicare Other | Admitting: Cardiology

## 2020-06-03 VITALS — BP 126/60 | HR 56 | Ht 71.0 in | Wt 163.4 lb

## 2020-06-03 DIAGNOSIS — I480 Paroxysmal atrial fibrillation: Secondary | ICD-10-CM

## 2020-06-03 DIAGNOSIS — N184 Chronic kidney disease, stage 4 (severe): Secondary | ICD-10-CM

## 2020-06-03 MED ORDER — FUROSEMIDE 40 MG PO TABS: 40.0000 mg | ORAL_TABLET | Freq: Every day | ORAL | 3 refills | Status: DC

## 2020-06-03 MED ORDER — METOPROLOL TARTRATE 25 MG PO TABS: ORAL_TABLET | ORAL | 3 refills | Status: DC

## 2020-06-03 NOTE — Patient Instructions (Addendum)
Medication Instructions:    Your physician has recommended you make the following change in your medication:   Increase furosemide to 40 mg daily. You may take (2) of your 20 mg tablets daily until finished  Continue other medications the same  Labwork:  None  Testing/Procedures:  None  Follow-Up:  Your physician recommends that you schedule a follow-up appointment in: 6 months.   Any Other Special Instructions Will Be Listed Below (If Applicable).  If you need a refill on your cardiac medications before your next appointment, please call your pharmacy.

## 2020-06-03 NOTE — Progress Notes (Signed)
Cardiology Office Note  Date: 06/03/2020   ID: Nathan Kim, DOB 03-14-1920, MRN [id number]  PCP:  [name], MD  Cardiologist:  Rozann Lesches, MD Electrophysiologist:  None   Chief Complaint  Patient presents with  . Cardiac follow-up    History of Present Illness: Nathan Kim is a 86 y.o. male former patient of Dr. Bronson Ing now presenting to establish follow-up with me.  I reviewed his records and updated the chart.  He was last assessed by telehealth encounter in March.  He is here today with his daughter.  Overall, he reports no sense of palpitations.  He has been taking Lasix 40 mg daily inadvertently, this actually provided good control of his edema.  Since discovering this and cutting back to 20 mg daily however, his weight has gone up 5 or 6 pounds and he has felt short of breath, woke up last night with shortness of breath.  We discussed advancing the dose back to 40 mg daily.  I do note that his renal function has further deteriorated however, recent lab work showed creatinine 3.03.  He had a recent visit with Dr. Manuella Ghazi.  I personally reviewed his ECG today which shows atypical atrial flutter versus coarse atrial fibrillation with heart rate 57, incomplete right bundle branch block and left anterior fascicular block.  He does not report any sudden dizziness or syncope.  Past Medical History:  Diagnosis Date  . Arthritis   . BPH (benign prostatic hyperplasia)   . CKD (chronic kidney disease) stage 4, GFR 15-29 ml/min (HCC)   . Essential hypertension   . Paroxysmal atrial fibrillation (HCC)   . Phlebitis 10/2016    Past Surgical History:  Procedure Laterality Date  . BACK SURGERY    . carpel tunnel    . EYE SURGERY    . HERNIA REPAIR    . LUMBAR LAMINECTOMY/DECOMPRESSION MICRODISCECTOMY N/A 02/27/2016   Procedure: LUMBAR LAMINECTOMY/DECOMPRESSION MICRODISCECTOMY 3 LEVELS;  Surgeon: Kristeen Miss, MD;  Location: [facility];  Service: Neurosurgery;   Laterality: N/A;  L2-3 L3-4 L4-5 Laminectomy  . TOTAL HIP ARTHROPLASTY Right 05/24/2017   Procedure: RIGHT TOTAL HIP ARTHROPLASTY ANTERIOR APPROACH;  Surgeon: Paralee Cancel, MD;  Location: [facility];  Service: Orthopedics;  Laterality: Right;  70 mins    Current Outpatient Medications  Medication Sig Dispense Refill  . amLODipine (NORVASC) 2.5 MG tablet Take 2.5 mg by mouth every evening.   4  . aspirin EC 81 MG tablet Take 81 mg by mouth every other day.     . lisinopril (PRINIVIL,ZESTRIL) 20 MG tablet Take 20 mg by mouth daily.   3  . tamsulosin (FLOMAX) 0.4 MG CAPS capsule Take 0.4 mg by mouth daily.    . traMADol (ULTRAM) 50 MG tablet Take 50 mg by mouth daily.    . furosemide (LASIX) 40 MG tablet Take 1 tablet (40 mg total) by mouth daily. 90 tablet 3  . metoprolol tartrate (LOPRESSOR) 25 MG tablet TAKE ONE-HALF TABLET BY MOUTH TWICE DAILY. GENERIC EQUIVALENT FOR LOPRESSOR 90 tablet 3   No current facility-administered medications for this visit.   Allergies:  Patient has no known allergies.   ROS:   Poor appetite.  Physical Exam: VS:  BP 126/60   Pulse (!) 56   Ht 5\' 11"  (1.803 m)   Wt 163 lb 6.4 oz (74.1 kg)   SpO2 97%   BMI 22.79 kg/m , BMI Body mass index is 22.79 kg/m.  Wt Readings from Last 3 Encounters:  06/03/20 163 lb 6.4 oz (74.1 kg)  12/03/19 162 lb (73.5 kg)  10/02/19 154 lb (69.9 kg)    General:  Elderly male, appears comfortable at rest. HEENT: Conjunctiva and lids normal, wearing a mask. Neck: Supple, no elevated JVP or carotid bruits, no thyromegaly. Lungs: Clear to auscultation, nonlabored breathing at rest. Cardiac: Irregularly irregular, soft systolic murmur, no pericardial rub. Abdomen: Soft, bowel sounds present. Extremities: Mild lower leg edema.  ECG:  An ECG dated 05/10/2018 was personally reviewed today and demonstrated:  Sinus rhythm with prolonged PR interval, incomplete right bundle branch block, left anterior fascicular block.  Recent  Labwork: 09/21/2019: B Natriuretic Peptide 363.0 10/22/2019: BUN 64; Creatinine, Ser 2.26; Potassium 4.9; Sodium 139  August 2021: Hemoglobin 9.6, platelets 160, TSH 8.89, cholesterol 144, triglycerides 74, HDL 38, LDL 91, BUN 64, creatinine 3.03, potassium 5.2, AST 15, ALT 5  Other Studies Reviewed Today:  Echocardiogram 09/21/2019: 1. Left ventricular ejection fraction, by visual estimation, is 55 to  60%. The left ventricle has normal function. There is moderately increased  left ventricular hypertrophy. Biplane LVEF and global longitudinal strain  measurements do not appear to be  accurate.  2. Left ventricular diastolic parameters are indeterminate.  3. The left ventricle has no regional wall motion abnormalities.  4. Global right ventricle has normal systolic function.The right  ventricular size is normal. No increase in right ventricular wall  thickness.  5. Left atrial size was normal.  6. Right atrial size was upper normal.  7. Mild mitral annular calcification.  8. The mitral valve is grossly normal. Trivial mitral valve  regurgitation.  9. The tricuspid valve is grossly normal. Tricuspid valve regurgitation  is trivial.  10. The aortic valve is tricuspid. Aortic valve regurgitation is not  visualized. Mild aortic valve sclerosis without stenosis.  11. The pulmonic valve was grossly normal. Pulmonic valve regurgitation is  trivial.  12. Normal pulmonary artery systolic pressure.  13. The inferior vena cava is normal in size with <50% respiratory  variability, suggesting right atrial pressure of 8 mmHg.  14. The tricuspid regurgitant velocity is 2.15 m/s, and with an assumed  right atrial pressure of 8 mmHg, the estimated right ventricular systolic  pressure is normal at 26.5 mmHg.   Assessment and Plan:  1.  Paroxysmal atrial fibrillation, currently in what looks to be coarse atrial fibrillation or atypical atrial flutter, controlled ventricular rate on  low-dose Lopressor.  He is not anticoagulated based on previous discussions, continues on low-dose aspirin however.  I reviewed his ECG today.  Continue observation, may need to take him off AV nodal blockers completely if he develops symptomatic bradycardia.  2.  Chronic diastolic heart failure by history, also complicated by CKD stage IV.  Most recent creatinine is 3.03.  He did not do as well after cutting Lasix back to 20 mg daily, increase to 40 mg daily.  Medication Adjustments/Labs and Tests Ordered: Current medicines are reviewed at length with the patient today.  Concerns regarding medicines are outlined above.   Tests Ordered: Orders Placed This Encounter  Procedures  . EKG 12-Lead    Medication Changes: Meds ordered this encounter  Medications  . furosemide (LASIX) 40 MG tablet    Sig: Take 1 tablet (40 mg total) by mouth daily.    Dispense:  90 tablet    Refill:  3    06/03/2020 dose increase    Disposition:  Follow up 6 months in the [place] office.  Signed, Satira Sark,  MD, [facility] 06/03/2020 4:05 PM    [facility] [place], [place], [place] [address] Phone: [phone]; Fax: [phone]

## 2020-06-04 ENCOUNTER — Telehealth: Payer: Medicare Other | Admitting: Cardiovascular Disease

## 2020-08-07 ENCOUNTER — Other Ambulatory Visit (HOSPITAL_COMMUNITY): Payer: Self-pay | Admitting: Nephrology

## 2020-08-07 DIAGNOSIS — I129 Hypertensive chronic kidney disease with stage 1 through stage 4 chronic kidney disease, or unspecified chronic kidney disease: Secondary | ICD-10-CM

## 2020-08-07 DIAGNOSIS — I5032 Chronic diastolic (congestive) heart failure: Secondary | ICD-10-CM

## 2020-08-07 DIAGNOSIS — D631 Anemia in chronic kidney disease: Secondary | ICD-10-CM

## 2020-08-07 DIAGNOSIS — N184 Chronic kidney disease, stage 4 (severe): Secondary | ICD-10-CM

## 2020-08-14 ENCOUNTER — Other Ambulatory Visit: Payer: Self-pay

## 2020-08-14 ENCOUNTER — Ambulatory Visit (HOSPITAL_COMMUNITY)
Admission: RE | Admit: 2020-08-14 | Discharge: 2020-08-14 | Disposition: A | Discharge status: Home / Self Care | Payer: Medicare Other | Source: Ambulatory Visit | Attending: Nephrology | Admitting: Nephrology

## 2020-08-14 DIAGNOSIS — I5032 Chronic diastolic (congestive) heart failure: Secondary | ICD-10-CM | POA: Insufficient documentation

## 2020-08-14 DIAGNOSIS — N184 Chronic kidney disease, stage 4 (severe): Secondary | ICD-10-CM | POA: Diagnosis present

## 2020-08-14 DIAGNOSIS — D631 Anemia in chronic kidney disease: Secondary | ICD-10-CM | POA: Diagnosis present

## 2020-08-14 DIAGNOSIS — N189 Chronic kidney disease, unspecified: Secondary | ICD-10-CM | POA: Diagnosis present

## 2020-08-14 DIAGNOSIS — I129 Hypertensive chronic kidney disease with stage 1 through stage 4 chronic kidney disease, or unspecified chronic kidney disease: Secondary | ICD-10-CM | POA: Insufficient documentation

## 2020-12-04 ENCOUNTER — Encounter: Payer: Self-pay | Admitting: Cardiology

## 2020-12-04 ENCOUNTER — Ambulatory Visit: Payer: Medicare Other | Admitting: Cardiology

## 2020-12-04 VITALS — BP 98/48 | HR 64 | Ht 71.0 in | Wt 145.0 lb

## 2020-12-04 DIAGNOSIS — I5032 Chronic diastolic (congestive) heart failure: Secondary | ICD-10-CM | POA: Diagnosis not present

## 2020-12-04 DIAGNOSIS — N184 Chronic kidney disease, stage 4 (severe): Secondary | ICD-10-CM | POA: Diagnosis not present

## 2020-12-04 DIAGNOSIS — I480 Paroxysmal atrial fibrillation: Secondary | ICD-10-CM

## 2020-12-04 NOTE — Progress Notes (Signed)
Cardiology Office Note  Date: 12/04/2020   ID: Nathan Kim, DOB 04-Aug-1920, MRN [id number]  PCP:  [name], MD  Cardiologist:  Rozann Lesches, MD Electrophysiologist:  None   Chief Complaint  Patient presents with  . Cardiac follow-up    History of Present Illness: Nathan Kim is a 87 y.o. male last seen in August 2021.  He is here today with his wife and daughter.  Reports no significant leg swelling at this point.  Using a cane to ambulate with unsteady gait.  I reviewed his current medications which have been simplified, no longer on lisinopril or Norvasc.  He follows with Dr. Theador Hawthorne with history of CKD stage IV.  Past Medical History:  Diagnosis Date  . Arthritis   . BPH (benign prostatic hyperplasia)   . CKD (chronic kidney disease) stage 4, GFR 15-29 ml/min (HCC)   . Essential hypertension   . Paroxysmal atrial fibrillation (HCC)   . Phlebitis 10/2016    Past Surgical History:  Procedure Laterality Date  . BACK SURGERY    . carpel tunnel    . EYE SURGERY    . HERNIA REPAIR    . LUMBAR LAMINECTOMY/DECOMPRESSION MICRODISCECTOMY N/A 02/27/2016   Procedure: LUMBAR LAMINECTOMY/DECOMPRESSION MICRODISCECTOMY 3 LEVELS;  Surgeon: Kristeen Miss, MD;  Location: [facility];  Service: Neurosurgery;  Laterality: N/A;  L2-3 L3-4 L4-5 Laminectomy  . TOTAL HIP ARTHROPLASTY Right 05/24/2017   Procedure: RIGHT TOTAL HIP ARTHROPLASTY ANTERIOR APPROACH;  Surgeon: Paralee Cancel, MD;  Location: [facility];  Service: Orthopedics;  Laterality: Right;  70 mins    Current Outpatient Medications  Medication Sig Dispense Refill  . aspirin EC 81 MG tablet Take 81 mg by mouth every other day.     . furosemide (LASIX) 40 MG tablet Take 1 tablet (40 mg total) by mouth daily. 90 tablet 3  . levothyroxine (SYNTHROID) 50 MCG tablet Take 50 mcg by mouth daily before breakfast.    . metoprolol tartrate (LOPRESSOR) 25 MG tablet TAKE ONE-HALF TABLET BY MOUTH TWICE DAILY. GENERIC  EQUIVALENT FOR LOPRESSOR 90 tablet 3  . tamsulosin (FLOMAX) 0.4 MG CAPS capsule Take 0.4 mg by mouth daily.    . traMADol (ULTRAM) 50 MG tablet Take 50 mg by mouth daily.    Marland Kitchen UNABLE TO FIND Med Name: [place]     No current facility-administered medications for this visit.   Allergies:  Patient has no known allergies.   ROS: Intermittent sense of palpitations.  No syncope.  Physical Exam: VS:  BP (!) 98/48   Pulse 64   Ht 5\' 11"  (1.803 m)   Wt 145 lb (65.8 kg)   SpO2 97%   BMI 20.22 kg/m , BMI Body mass index is 20.22 kg/m.  Wt Readings from Last 3 Encounters:  12/04/20 145 lb (65.8 kg)  06/03/20 163 lb 6.4 oz (74.1 kg)  12/03/19 162 lb (73.5 kg)    General: Elderly male, appears comfortable at rest. HEENT: Conjunctiva and lids normal, wearing a mask. Neck: Supple, no elevated JVP or carotid bruits, no thyromegaly. Lungs: Clear to auscultation, nonlabored breathing at rest. Cardiac: Irregularly irregular, no S3, soft systolic murmur, no pericardial rub. Extremities: Trace ankle edema.  ECG:  An ECG dated 06/03/2020 was personally reviewed today and demonstrated:  Atypical atrial flutter versus coarse atrial fibrillation, incomplete right bundle branch block, left anterior fascicular block.  Recent Labwork:  August 2021: Hemoglobin 9.6, platelets 160, TSH 8.89, cholesterol 144, triglycerides 74, HDL 38, LDL 91,  BUN 64, creatinine 3.03, potassium 5.2, AST 15, ALT 08 October 2020: Hemoglobin 12.4, platelets 160, BUN 56, creatinine 1.9, potassium 5.1  Other Studies Reviewed Today:  Echocardiogram 09/21/2019: 1. Left ventricular ejection fraction, by visual estimation, is 55 to  60%. The left ventricle has normal function. There is moderately increased  left ventricular hypertrophy. Biplane LVEF and global longitudinal strain  measurements do not appear to be  accurate.  2. Left ventricular diastolic parameters are indeterminate.  3. The left ventricle has no  regional wall motion abnormalities.  4. Global right ventricle has normal systolic function.The right  ventricular size is normal. No increase in right ventricular wall  thickness.  5. Left atrial size was normal.  6. Right atrial size was upper normal.  7. Mild mitral annular calcification.  8. The mitral valve is grossly normal. Trivial mitral valve  regurgitation.  9. The tricuspid valve is grossly normal. Tricuspid valve regurgitation  is trivial.  10. The aortic valve is tricuspid. Aortic valve regurgitation is not  visualized. Mild aortic valve sclerosis without stenosis.  11. The pulmonic valve was grossly normal. Pulmonic valve regurgitation is  trivial.  12. Normal pulmonary artery systolic pressure.  13. The inferior vena cava is normal in size with <50% respiratory  variability, suggesting right atrial pressure of 8 mmHg.  14. The tricuspid regurgitant velocity is 2.15 m/s, and with an assumed  right atrial pressure of 8 mmHg, the estimated right ventricular systolic  pressure is normal at 26.5 mmHg.   Assessment and Plan:  1.  Persistent atrial fibrillation.  CHA2DS2-VASc score is 3.  He is not anticoagulated based on previous discussions but otherwise continues on low-dose aspirin and Lopressor.  Heart rate is well controlled today.  2.  CKD stage IV, following with Dr. Theador Hawthorne.  Creatinine 3.03.  He is now off lisinopril.  3.  Chronic diastolic heart failure.  Volume status looks adequately controlled on current dose of Lasix 40 mg alternating with 20 mg every other day.  Medication Adjustments/Labs and Tests Ordered: Current medicines are reviewed at length with the patient today.  Concerns regarding medicines are outlined above.   Tests Ordered: No orders of the defined types were placed in this encounter.   Medication Changes: No orders of the defined types were placed in this encounter.   Disposition:  Follow up 6 months in the [place]  office.  Signed, Satira Sark, MD, [facility] 12/04/2020 4:00 PM    [facility] [place], [place], [place] [address] Phone: [phone]; Fax: [phone]

## 2020-12-04 NOTE — Patient Instructions (Addendum)

## 2021-01-05 ENCOUNTER — Other Ambulatory Visit: Payer: Self-pay | Admitting: *Deleted

## 2021-01-05 MED ORDER — FUROSEMIDE 40 MG PO TABS: 40.0000 mg | ORAL_TABLET | Freq: Every day | ORAL | 3 refills | Status: DC

## 2021-01-06 ENCOUNTER — Telehealth: Payer: Self-pay | Admitting: Cardiology

## 2021-01-06 MED ORDER — FUROSEMIDE 40 MG PO TABS: ORAL_TABLET | ORAL | 3 refills | Status: DC

## 2021-01-06 MED ORDER — FUROSEMIDE 20 MG PO TABS: ORAL_TABLET | ORAL | 3 refills | Status: DC

## 2021-01-06 NOTE — Telephone Encounter (Signed)
*  STAT* If patient is at the pharmacy, call can be transferred to refill team.   1. Which medications need to be refilled? (please list name of each medication and dose if known)  LASIX 20 MG   2. Which pharmacy/location (including street and city if local pharmacy) is medication to be sent to?   3. Do they need a 30 day or 90 day supply? 90  Patient needs RX for 20 mg and 40 mg.  Need to call in the 20 mg.

## 2021-01-06 NOTE — Telephone Encounter (Signed)
Notes from office visit with Dr.McDowell on 12/04/20:  3.  Chronic diastolic heart failure.  Volume status looks adequately controlled on current dose of Lasix 40 mg alternating with 20 mg every other day.    Refilled to [facility] mail order

## 2021-05-14 ENCOUNTER — Telehealth: Payer: Self-pay | Admitting: Cardiology

## 2021-05-14 NOTE — Telephone Encounter (Signed)
Left message to return call 

## 2021-05-14 NOTE — Telephone Encounter (Signed)
   1. Which medications need to be refilled? (please list name of each medication and dose if known)   tamsulosin (FLOMAX) 0.4 MG CAPS capsule [[id number]]   2. Which pharmacy/location (including street and city if local pharmacy) is medication to be sent to? [facility]   3. Do they need a 30 day or 90 day supply? [place]

## 2021-05-15 NOTE — Telephone Encounter (Signed)
Informed daughter that he will have to get this filled by doctor that prescribed it - we do not fill this medication here.

## 2021-06-05 ENCOUNTER — Inpatient Hospital Stay (HOSPITAL_COMMUNITY)
Admission: EM | Admit: 2021-06-05 | Discharge: 2021-06-08 | DRG: 389 | Disposition: A | Discharge status: Home / Self Care | Payer: Medicare Other | Attending: Internal Medicine | Admitting: Internal Medicine

## 2021-06-05 ENCOUNTER — Emergency Department (HOSPITAL_COMMUNITY): Payer: Medicare Other

## 2021-06-05 ENCOUNTER — Encounter (HOSPITAL_COMMUNITY): Payer: Self-pay

## 2021-06-05 ENCOUNTER — Other Ambulatory Visit: Payer: Self-pay

## 2021-06-05 DIAGNOSIS — Z20822 Contact with and (suspected) exposure to covid-19: Secondary | ICD-10-CM | POA: Diagnosis not present

## 2021-06-05 DIAGNOSIS — D631 Anemia in chronic kidney disease: Secondary | ICD-10-CM | POA: Diagnosis present

## 2021-06-05 DIAGNOSIS — Z87891 Personal history of nicotine dependence: Secondary | ICD-10-CM | POA: Diagnosis not present

## 2021-06-05 DIAGNOSIS — I129 Hypertensive chronic kidney disease with stage 1 through stage 4 chronic kidney disease, or unspecified chronic kidney disease: Secondary | ICD-10-CM | POA: Diagnosis not present

## 2021-06-05 DIAGNOSIS — Z8672 Personal history of thrombophlebitis: Secondary | ICD-10-CM

## 2021-06-05 DIAGNOSIS — N184 Chronic kidney disease, stage 4 (severe): Secondary | ICD-10-CM | POA: Diagnosis not present

## 2021-06-05 DIAGNOSIS — Z66 Do not resuscitate: Secondary | ICD-10-CM | POA: Diagnosis not present

## 2021-06-05 DIAGNOSIS — Z7982 Long term (current) use of aspirin: Secondary | ICD-10-CM

## 2021-06-05 DIAGNOSIS — Z79890 Hormone replacement therapy (postmenopausal): Secondary | ICD-10-CM

## 2021-06-05 DIAGNOSIS — I10 Essential (primary) hypertension: Secondary | ICD-10-CM | POA: Diagnosis present

## 2021-06-05 DIAGNOSIS — H9190 Unspecified hearing loss, unspecified ear: Secondary | ICD-10-CM | POA: Diagnosis present

## 2021-06-05 DIAGNOSIS — N400 Enlarged prostate without lower urinary tract symptoms: Secondary | ICD-10-CM | POA: Diagnosis not present

## 2021-06-05 DIAGNOSIS — Z79899 Other long term (current) drug therapy: Secondary | ICD-10-CM | POA: Diagnosis not present

## 2021-06-05 DIAGNOSIS — E039 Hypothyroidism, unspecified: Secondary | ICD-10-CM | POA: Diagnosis present

## 2021-06-05 DIAGNOSIS — K567 Ileus, unspecified: Secondary | ICD-10-CM | POA: Diagnosis present

## 2021-06-05 DIAGNOSIS — I440 Atrioventricular block, first degree: Secondary | ICD-10-CM | POA: Diagnosis present

## 2021-06-05 DIAGNOSIS — M1990 Unspecified osteoarthritis, unspecified site: Secondary | ICD-10-CM | POA: Diagnosis not present

## 2021-06-05 DIAGNOSIS — Z96641 Presence of right artificial hip joint: Secondary | ICD-10-CM | POA: Diagnosis present

## 2021-06-05 DIAGNOSIS — I480 Paroxysmal atrial fibrillation: Secondary | ICD-10-CM | POA: Diagnosis not present

## 2021-06-05 DIAGNOSIS — G2581 Restless legs syndrome: Secondary | ICD-10-CM | POA: Diagnosis present

## 2021-06-05 DIAGNOSIS — R778 Other specified abnormalities of plasma proteins: Secondary | ICD-10-CM | POA: Diagnosis present

## 2021-06-05 DIAGNOSIS — D649 Anemia, unspecified: Secondary | ICD-10-CM | POA: Diagnosis present

## 2021-06-05 DIAGNOSIS — Z8249 Family history of ischemic heart disease and other diseases of the circulatory system: Secondary | ICD-10-CM

## 2021-06-05 DIAGNOSIS — R1084 Generalized abdominal pain: Secondary | ICD-10-CM

## 2021-06-05 HISTORY — DX: Chronic kidney disease, stage 4 (severe): N18.4

## 2021-06-05 LAB — CBC
HCT: 38.9 % — ABNORMAL LOW (ref 39.0–52.0)
Hemoglobin: 11.9 g/dL — ABNORMAL LOW (ref 13.0–17.0)
MCH: 29.1 pg (ref 26.0–34.0)
MCHC: 30.6 g/dL (ref 30.0–36.0)
MCV: 95.1 fL (ref 80.0–100.0)
Platelets: 167 10*3/uL (ref 150–400)
RBC: 4.09 MIL/uL — ABNORMAL LOW (ref 4.22–5.81)
RDW: 12.8 % (ref 11.5–15.5)
WBC: 7.5 10*3/uL (ref 4.0–10.5)
nRBC: 0 % (ref 0.0–0.2)

## 2021-06-05 LAB — URINALYSIS, ROUTINE W REFLEX MICROSCOPIC
Bilirubin Urine: NEGATIVE
Glucose, UA: NEGATIVE mg/dL
Hgb urine dipstick: NEGATIVE
Ketones, ur: NEGATIVE mg/dL
Leukocytes,Ua: NEGATIVE
Nitrite: NEGATIVE
Protein, ur: NEGATIVE mg/dL
Specific Gravity, Urine: 1.02 (ref 1.005–1.030)
pH: 5.5 (ref 5.0–8.0)

## 2021-06-05 LAB — COMPREHENSIVE METABOLIC PANEL
ALT: 8 U/L (ref 0–44)
AST: 16 U/L (ref 15–41)
Albumin: 4 g/dL (ref 3.5–5.0)
Alkaline Phosphatase: 97 U/L (ref 38–126)
Anion gap: 8 (ref 5–15)
BUN: 55 mg/dL — ABNORMAL HIGH (ref 8–23)
CO2: 24 mmol/L (ref 22–32)
Calcium: 8.9 mg/dL (ref 8.9–10.3)
Chloride: 105 mmol/L (ref 98–111)
Creatinine, Ser: 2.03 mg/dL — ABNORMAL HIGH (ref 0.61–1.24)
GFR, Estimated: 29 mL/min — ABNORMAL LOW (ref >60)
Glucose, Bld: 231 mg/dL — ABNORMAL HIGH (ref 70–99)
Potassium: 4.5 mmol/L (ref 3.5–5.1)
Sodium: 137 mmol/L (ref 135–145)
Total Bilirubin: 0.6 mg/dL (ref 0.3–1.2)
Total Protein: 6.8 g/dL (ref 6.5–8.1)

## 2021-06-05 LAB — LIPASE, BLOOD: Lipase: 24 U/L (ref 11–51)

## 2021-06-05 LAB — RESP PANEL BY RT-PCR (FLU A&B, COVID) ARPGX2
Influenza A by PCR: NEGATIVE
Influenza B by PCR: NEGATIVE
SARS Coronavirus 2 by RT PCR: NEGATIVE

## 2021-06-05 LAB — TROPONIN I (HIGH SENSITIVITY)
Troponin I (High Sensitivity): 67 ng/L — ABNORMAL HIGH (ref <18)
Troponin I (High Sensitivity): 64 ng/L — ABNORMAL HIGH (ref <18)

## 2021-06-05 LAB — CREATININE, SERUM
Creatinine, Ser: 1.73 mg/dL — ABNORMAL HIGH (ref 0.61–1.24)
GFR, Estimated: 35 mL/min — ABNORMAL LOW (ref >60)

## 2021-06-05 LAB — MAGNESIUM: Magnesium: 2.4 mg/dL (ref 1.7–2.4)

## 2021-06-05 LAB — LACTIC ACID, PLASMA: Lactic Acid, Venous: 1.3 mmol/L (ref 0.5–1.9)

## 2021-06-05 MED ORDER — FAMOTIDINE IN NACL 20-0.9 MG/50ML-% IV SOLN
20.0000 mg | INTRAVENOUS | Status: DC
  Administered 2021-06-06 – 2021-06-07 (×3): 20 mg via INTRAVENOUS
  Filled 2021-06-05 (×3): qty 50

## 2021-06-05 MED ORDER — ENOXAPARIN SODIUM 30 MG/0.3ML IJ SOSY
30.0000 mg | PREFILLED_SYRINGE | INTRAMUSCULAR | Status: DC
  Administered 2021-06-06 – 2021-06-07 (×3): 30 mg via SUBCUTANEOUS
  Filled 2021-06-05 (×3): qty 0.3

## 2021-06-05 MED ORDER — LACTATED RINGERS IV SOLN: INTRAVENOUS | Status: DC

## 2021-06-05 MED ORDER — SODIUM CHLORIDE 0.9 % IV BOLUS
500.0000 mL | Freq: Once | INTRAVENOUS | Status: AC
  Administered 2021-06-05: 500 mL via INTRAVENOUS

## 2021-06-05 MED ORDER — IOHEXOL 9 MG/ML PO SOLN
ORAL | Status: AC
  Filled 2021-06-05: qty 1000

## 2021-06-05 MED ORDER — ACETAMINOPHEN 650 MG RE SUPP: 650.0000 mg | Freq: Four times a day (QID) | RECTAL | Status: DC | PRN

## 2021-06-05 MED ORDER — ONDANSETRON HCL 4 MG PO TABS: 4.0000 mg | ORAL_TABLET | Freq: Four times a day (QID) | ORAL | Status: DC | PRN

## 2021-06-05 MED ORDER — ONDANSETRON HCL 4 MG/2ML IJ SOLN: 4.0000 mg | Freq: Four times a day (QID) | INTRAMUSCULAR | Status: DC | PRN

## 2021-06-05 MED ORDER — LACTATED RINGERS IV SOLN: INTRAVENOUS | Status: AC

## 2021-06-05 MED ORDER — FENTANYL CITRATE PF 50 MCG/ML IJ SOSY: 25.0000 ug | PREFILLED_SYRINGE | INTRAMUSCULAR | Status: DC | PRN

## 2021-06-05 MED ORDER — ACETAMINOPHEN 325 MG PO TABS: 650.0000 mg | ORAL_TABLET | Freq: Four times a day (QID) | ORAL | Status: DC | PRN

## 2021-06-05 NOTE — ED Provider Notes (Signed)
Emergency Department Provider Note   I have reviewed the triage vital signs and the nursing notes.   HISTORY  Chief Complaint Abdominal Pain   HPI Nathan Kim is a 87 y.o. male with past medical history reviewed below presents to the emergency department with abdominal pain this morning.  He is accompanied by his family member who states that he was doubled over in severe pain this morning.  He had an episode several weeks ago that resolved without intervention or seeking medical care.  Patient tells me that the pain was all over through his abdomen.  He feels the pain is reduced but sometimes comes back in waves of discomfort.  He is not having pain into his chest or feeling short of breath.  No vomiting although did feel nauseated.  No diarrhea.  He has been having bowel movements regularly.   Past Medical History:  Diagnosis Date   Arthritis    BPH (benign prostatic hyperplasia)    CKD (chronic kidney disease) stage 4, GFR 15-29 ml/min (HCC)    CKD (chronic kidney disease), stage IV ([place]) 06/05/2021   Essential hypertension    Paroxysmal atrial fibrillation ([place])    Phlebitis 10/2016    Patient Active Problem List   Diagnosis Date Noted   Ileus ([place]) 06/05/2021   CKD (chronic kidney disease), stage IV ([place]) 06/05/2021   Normocytic anemia 06/05/2021   Elevated troponin 06/05/2021   S/P right THA, AA 05/24/2017   Lumbar stenosis with neurogenic claudication 02/27/2016   PAF (paroxysmal atrial fibrillation) ([place]) 02/25/2016   Essential hypertension 02/25/2016   Preoperative clearance 02/25/2016   PVC's (premature ventricular contractions) 02/25/2016   First degree heart block 02/25/2016    Past Surgical History:  Procedure Laterality Date   BACK SURGERY     carpel tunnel     EYE SURGERY     HERNIA REPAIR     LUMBAR LAMINECTOMY/DECOMPRESSION MICRODISCECTOMY N/A 02/27/2016   Procedure: LUMBAR LAMINECTOMY/DECOMPRESSION MICRODISCECTOMY 3 LEVELS;  Surgeon: Kristeen Miss, MD;  Location: [facility];  Service: Neurosurgery;  Laterality: N/A;  L2-3 L3-4 L4-5 Laminectomy   TOTAL HIP ARTHROPLASTY Right 05/24/2017   Procedure: RIGHT TOTAL HIP ARTHROPLASTY ANTERIOR APPROACH;  Surgeon: Paralee Cancel, MD;  Location: [facility];  Service: Orthopedics;  Laterality: Right;  70 mins    Allergies Patient has no known allergies.  No family history on file.  Social History Social History   Tobacco Use   Smoking status: Former    Packs/day: 0.50    Years: 50.00    Pack years: 25.00    Types: Cigarettes    Start date: 07/03/1929    Quit date: 07/04/1979    Years since quitting: 41.9   Smokeless tobacco: Never  Vaping Use   Vaping Use: Never used  Substance Use Topics   Alcohol use: No    Alcohol/week: 0.0 standard drinks   Drug use: No    Review of Systems  Constitutional: No fever/chills Eyes: No visual changes. ENT: No sore throat. Cardiovascular: Denies chest pain. Respiratory: Denies shortness of breath. Gastrointestinal: Positive abdominal pain. Positive nausea, no vomiting.  No diarrhea.  No constipation. Genitourinary: Negative for dysuria. Musculoskeletal: Negative for back pain. Skin: Negative for rash. Neurological: Negative for headaches, focal weakness or numbness.  10-point ROS otherwise negative.  ____________________________________________   PHYSICAL EXAM:  VITAL SIGNS: ED Triage Vitals  Enc Vitals Group     BP 06/05/21 1557 131/65     Pulse Rate 06/05/21 1557 63  Resp 06/05/21 1557 18     Temp 06/05/21 1557 97.9 F (36.6 C)     Temp Source 06/05/21 1557 Oral     SpO2 06/05/21 1557 100 %     Weight 06/05/21 1558 145 lb (65.8 kg)     Height 06/05/21 1558 5\' 11"  (1.803 m)    Constitutional: Alert and oriented. Well appearing and in no acute distress. Eyes: Conjunctivae are normal.  Head: Atraumatic. Nose: No congestion/rhinnorhea. Mouth/Throat: Mucous membranes are moist.  Neck: No stridor.  Cardiovascular:  Normal rate, regular rhythm. Good peripheral circulation. Grossly normal heart sounds.   Respiratory: Normal respiratory effort.  No retractions. Lungs CTAB. Gastrointestinal: Soft with mild tenderness throughout but nothing focal. No peritonitis. No distention.  Musculoskeletal: No lower extremity tenderness nor edema. No gross deformities of extremities. Neurologic:  Normal speech and language. No gross focal neurologic deficits are appreciated.  Skin:  Skin is warm, dry and intact. No rash noted.  ____________________________________________   LABS (all labs ordered are listed, but only abnormal results are displayed)  Labs Reviewed  COMPREHENSIVE METABOLIC PANEL - Abnormal; Notable for the following components:      Result Value   Glucose, Bld 231 (*)    BUN 55 (*)    Creatinine, Ser 2.03 (*)    GFR, Estimated 29 (*)    All other components within normal limits  CBC - Abnormal; Notable for the following components:   RBC 4.09 (*)    Hemoglobin 11.9 (*)    HCT 38.9 (*)    All other components within normal limits  TROPONIN I (HIGH SENSITIVITY) - Abnormal; Notable for the following components:   Troponin I (High Sensitivity) 67 (*)    All other components within normal limits  TROPONIN I (HIGH SENSITIVITY) - Abnormal; Notable for the following components:   Troponin I (High Sensitivity) 64 (*)    All other components within normal limits  RESP PANEL BY RT-PCR (FLU A&B, COVID) ARPGX2  LIPASE, BLOOD  URINALYSIS, ROUTINE W REFLEX MICROSCOPIC  LACTIC ACID, PLASMA  LACTIC ACID, PLASMA  CREATININE, SERUM  COMPREHENSIVE METABOLIC PANEL  CBC  MAGNESIUM   ____________________________________________  EKG   EKG Interpretation  Date/Time:  Friday June 05 2021 17:00:15 EDT Ventricular Rate:  67 PR Interval:  259 QRS Duration: 139 QT Interval:  468 QTC Calculation: 495 R Axis:   -71 Text Interpretation: Sinus rhythm Ventricular premature complex Prolonged PR interval  RBBB and LAFB Similar to May 2017 tracing Confirmed by Nanda Quinton [phone]) on 06/05/2021 5:11:45 PM        ____________________________________________  RADIOLOGY  CT ABDOMEN PELVIS WO CONTRAST  Result Date: 06/05/2021 CLINICAL DATA:  Acute nonlocalized abdominal pain. Pain onset this morning. EXAM: CT ABDOMEN AND PELVIS WITHOUT CONTRAST TECHNIQUE: Multidetector CT imaging of the abdomen and pelvis was performed following the standard protocol without IV contrast. COMPARISON:  Renal ultrasound 08/14/2020. FINDINGS: Lower chest: Suspected emphysema with coarse basilar lung markings. Mild cardiomegaly. Coronary artery calcifications. No acute airspace disease or pleural effusion. Hepatobiliary: Punctate granuloma in the left lobe of the liver. No evidence of focal liver lesion on this unenhanced exam. Gallbladder physiologically distended, no calcified stone. No biliary dilatation. Pancreas: Parenchymal atrophy. Coarse calcifications in the uncinate process and pancreatic head. No ductal dilatation or peripancreatic fat stranding. Spleen: Normal in size without focal abnormality. Adrenals/Urinary Tract: Adrenal thickening without dominant adrenal nodule. Bilateral renal parenchymal cortical thinning. No hydronephrosis. Left renal cysts, including a 4.1 cm renal sinus cyst inferiorly. No  visualized renal calculi. Portions of the urinary bladder obscured by streak artifact from right hip arthroplasty. The bladder is grossly normal. Stomach/Bowel: Small amount of enteric contrast in the distal esophagus. Stomach is distended with enteric contrast. Duodenal diverticulum. Enteric contrast reaches the distal small bowel. Mild fecalization of distal small bowel contents with prominent small bowel dimension of 2.5 cm. No evidence of obstruction or discrete transition point. No definite small bowel inflammation. Fluid/liquid stool in the ascending and proximal transverse colon. Difficult to assess for wall thickening  given fluid-filled colon. There may be minimal pericolonic edema. Moderate volume of stool in the transverse and descending colon. Prominent sigmoid colonic diverticulosis. Portions of the sigmoid are obscured by streak artifact from right hip arthroplasty. No obvious diverticulitis or colonic inflammation. The appendix is not confidently visualized, there is no evidence of appendicitis. Vascular/Lymphatic: Moderate aorto bi-iliac atherosclerosis. No aortic aneurysm. No portal venous or mesenteric gas. No bulky abdominopelvic adenopathy. Reproductive: Prostate is obscured by streak artifact from right hip arthroplasty. The visualized prostate appears prominent spanning 5.1 cm transverse. Other: Small volume of free fluid in the right pericolic gutter and right lower quadrant. Trace upper abdominal ascites/free fluid. No free air or focal fluid collection. Tiny fat containing umbilical hernia. Query prior right inguinal hernia repair. Musculoskeletal: Prominent Schmorl's node within superior endplate of L2. Mild diffuse degenerative change in the lumbar spine with scoliosis. Right hip arthroplasty. IMPRESSION: 1. Small volume of free fluid in the right pericolic gutter and right lower quadrant, nonspecific. There is minimal liquid stool in the ascending colon, with slightly prominent distal small bowel and fecalization of small bowel contents. Possibility of ascending colitis is raised with subsequent small bowel ileus. 2. Distal colonic diverticulosis without diverticulitis. 3. Bilateral renal parenchymal thinning. Left renal cysts. 4. Advanced aortic and branch atherosclerosis. Aortic Atherosclerosis (ICD10-I70.0). Electronically Signed   By: Keith Rake M.D.   On: 06/05/2021 20:07    ____________________________________________   PROCEDURES  Procedure(s) performed:   Procedures  None ____________________________________________   INITIAL IMPRESSION / ASSESSMENT AND PLAN / ED COURSE  Pertinent  labs & imaging results that were available during my care of the patient were reviewed by me and considered in my medical decision making (see chart for details).   Patient presents to the emergency department with abdominal pain which is improved but given his age and apparent distress earlier plan for CT imaging to look further.  Patient does have prior surgical history of hernia repair but denies other abdominal surgery history.  Lower suspicion for dissection with intermittent type pain but will need further evaluation with CT.   Differential diagnosis includes but is not exclusive to acute appendicitis, renal colic, testicular torsion, urinary tract infection, prostatitis,  diverticulitis, small bowel obstruction, colitis, abdominal aortic aneurysm, gastroenteritis, constipation etc.   Labs reviewed. Troponin mildly elevated but flat on delta. EKG unremarkable. CT reviewed. Favor ileus over colitis. Will monitor for development of diarrhea, fever, leukocytosis. No abx for now.   Discussed patient's case with [facility] to request admission. Patient and family (if present) updated with plan. Care transferred to [facility] service.  I reviewed all nursing notes, vitals, pertinent old records, EKGs, labs, imaging (as available).  ____________________________________________  FINAL CLINICAL IMPRESSION(S) / ED DIAGNOSES  Final diagnoses:  Ileus ([place])  Generalized abdominal pain     MEDICATIONS GIVEN DURING THIS VISIT:  Medications  lactated ringers infusion (has no administration in time range)  enoxaparin (LOVENOX) injection 30 mg (has no administration in time range)  acetaminophen (TYLENOL) tablet 650 mg (has no administration in time range)    Or  acetaminophen (TYLENOL) suppository 650 mg (has no administration in time range)  ondansetron (ZOFRAN) tablet 4 mg (has no administration in time range)    Or  ondansetron (ZOFRAN) injection 4 mg (has no administration in time range)  famotidine  (PEPCID) IVPB 20 mg premix (has no administration in time range)  sodium chloride 0.9 % bolus 500 mL (500 mLs Intravenous Bolus 06/05/21 1721)  iohexol (OMNIPAQUE) 9 MG/ML oral solution (  Contrast Given 06/05/21 1720)    Note:  This document was prepared using Dragon voice recognition software and may include unintentional dictation errors.  Nanda Quinton, MD, [facility]    Aneesah Hernan, [facility], MD 06/05/21 [phone]

## 2021-06-05 NOTE — ED Triage Notes (Signed)
Pt presents to ED with complaints of generalized abdominal pain started this morning. Pt also nauseated. Pt last BM yesterday

## 2021-06-05 NOTE — ED Notes (Signed)
Pt gone to CT 

## 2021-06-05 NOTE — H&P (Signed)
History and Physical    Nathan Kim DOB: 12/19/1919 DOA: 06/05/2021  PCP: [name], MD   Patient coming from: Home.  I have personally briefly reviewed patient's old medical records in [place]  Chief Complaint: Abdominal pain.  HPI: Nathan Kim is a 87 y.o. male with medical history significant of osteoarthritis, BPH, stage IV CKD, essential hypertension, paroxysmal atrial fibrillation, history of phlebitis who is coming to the emergency department due to complaints of abdominal pain since around 6 in the morning.  The patient's daughter stated that he woke up with intense pain that subsequently became less intense but then around 1000 the patient had recurrence of the pain with increased intensity and persistent nausea.  He had 1 episode of loose stools.  He was unable to have much oral intake for the day.  He was able to take his regular medications including his diuretic of furosemide 20 mg on alternating days with 40 mg.  The history was taken from the patient's daughter but he is able to answer simple questions.  He denied headache, chest or back pain.  ED Course: Initial vital signs were temperature 97.9 F, pulse 63, respiration 18, BP 131/65 mmHg O2 sat 100% on room air.  The patient Risi 500 mL of LR bolus.   Lab work: His urinalysis was normal.  Troponin was 67 and then 64 ng/L.  CBC showed a white count of 7.5, hemoglobin 11.9 g/dL platelets 167.  CMP had a glucose of 231, BUN of 55 and creatinine 2.03 mg/dL.  The rest of the CMP values were unremarkable.  Lipase was normal.  Lactic acid was normal twice.  Imaging: CT abdomen/pelvis with contrast showed a small volume of free fluid in the right pericolic gutter and right lower quadrant, no nonspecific.  There was minimal liquid stool in the ascending colon with slightly prominent distal small bowel and fecalization with small bowel contents.  Possibly ascending colitis history with subsequent small  bowel ileus.  There is distal colonic diverticulosis without diverticulitis.  There is advanced aortic and aortic branches atherosclerosis.  Please see images and full radiology report for further detail.  Review of Systems: As per HPI otherwise all other systems reviewed and are negative.  Past Medical History:  Diagnosis Date   Arthritis    BPH (benign prostatic hyperplasia)    CKD (chronic kidney disease) stage 4, GFR 15-29 ml/min (HCC)    CKD (chronic kidney disease), stage IV ([place]) 06/05/2021   Essential hypertension    Paroxysmal atrial fibrillation (HCC)    Phlebitis 10/2016    Past Surgical History:  Procedure Laterality Date   BACK SURGERY     carpel tunnel     EYE SURGERY     HERNIA REPAIR     LUMBAR LAMINECTOMY/DECOMPRESSION MICRODISCECTOMY N/A 02/27/2016   Procedure: LUMBAR LAMINECTOMY/DECOMPRESSION MICRODISCECTOMY 3 LEVELS;  Surgeon: Kristeen Miss, MD;  Location: [facility];  Service: Neurosurgery;  Laterality: N/A;  L2-3 L3-4 L4-5 Laminectomy   TOTAL HIP ARTHROPLASTY Right 05/24/2017   Procedure: RIGHT TOTAL HIP ARTHROPLASTY ANTERIOR APPROACH;  Surgeon: Paralee Cancel, MD;  Location: [facility];  Service: Orthopedics;  Laterality: Right;  70 mins    Social History  reports that he quit smoking about 41 years ago. His smoking use included cigarettes. He started smoking about 91 years ago. He has a 25.00 pack-year smoking history. He has never used smokeless tobacco. He reports that he does not drink alcohol and does not use drugs.  No  Known Allergies  Family medical history Multiple relatives with hypertension.  Prior to Admission medications   Medication Sig Start Date End Date Taking? Authorizing Provider  aspirin EC 81 MG tablet Take 81 mg by mouth every other day.    Yes [provider]  furosemide (LASIX) 20 MG tablet Take 20 mg daily alternating with 40 mg every other day 01/06/21  Yes Satira Sark, MD  furosemide (LASIX) 40 MG tablet Take 40 mg daily  alternating with 20 mg every other day 01/06/21  Yes Satira Sark, MD  levothyroxine (SYNTHROID) 50 MCG tablet Take 50 mcg by mouth daily before breakfast.   Yes [provider]  metoprolol tartrate (LOPRESSOR) 25 MG tablet TAKE ONE-HALF TABLET BY MOUTH TWICE DAILY. GENERIC EQUIVALENT FOR LOPRESSOR 06/03/20  Yes Satira Sark, MD  tamsulosin (FLOMAX) 0.4 MG CAPS capsule Take 0.4 mg by mouth daily.   Yes [provider]  traMADol (ULTRAM) 50 MG tablet Take 25 mg by mouth daily.   Yes [provider]  triamcinolone cream (KENALOG) 0.1 % Apply 1 application topically 2 (two) times daily. 04/22/21  Yes [provider]  [place] Name: [place]   Yes [provider]    Physical Exam: Vitals:   06/05/21 2030 06/05/21 2130 06/05/21 2200 06/05/21 2230  BP: 118/74 (!) 145/94 100/69 102/68  Pulse: 89 95 85 88  Resp: 16 (!) 25    Temp:      TempSrc:      SpO2: 98% 99% 99% 97%  Weight:      Height:       Constitutional: NAD, calm, comfortable Eyes: PERRL, lids and conjunctivae normal ENMT: Moderate impaired hearing.  Mucous membranes are mildly dry.  Posterior pharynx clear of any exudate or lesions.Normal dentition.  Neck: normal, supple, no masses, no thyromegaly Respiratory: clear to auscultation bilaterally, no wheezing, no crackles. Normal respiratory effort. No accessory muscle use.  Cardiovascular: Regular rate and rhythm, no murmurs / rubs / gallops. No extremity edema. 2+ pedal pulses. No carotid bruits.  Abdomen: No distention.  Bowel sounds positive.  Soft, no tenderness (had been medicated with analgesics), no masses palpated. No hepatosplenomegaly. Musculoskeletal: no clubbing / cyanosis.  Good ROM, no contractures. Normal muscle tone.  Skin: no acute rashes, lesions, ulcers on very limited dermatological examination. Neurologic: CN 2-12 grossly intact. Sensation intact, DTR normal. Strength 5/5 in all 4.   Psychiatric: Normal judgment and insight. Alert and oriented x 3. Normal mood.   Labs on Admission: I have personally reviewed following labs and imaging studies  CBC: Recent Labs  Lab 06/05/21 1606  WBC 7.5  HGB 11.9*  HCT 38.9*  MCV 95.1  PLT 161    Basic Metabolic Panel: Recent Labs  Lab 06/05/21 1606  NA 137  K 4.5  CL 105  CO2 24  GLUCOSE 231*  BUN 55*  CREATININE 2.03*  CALCIUM 8.9    GFR: Estimated Creatinine Clearance: 18.9 mL/min (A) (by C-G formula based on SCr of 2.03 mg/dL (H)).  Liver Function Tests: Recent Labs  Lab 06/05/21 1606  AST 16  ALT 8  ALKPHOS 97  BILITOT 0.6  PROT 6.8  ALBUMIN 4.0    Urine analysis:    Component Value Date/Time   COLORURINE YELLOW 06/05/2021 1711   APPEARANCEUR CLEAR 06/05/2021 1711   LABSPEC 1.020 06/05/2021 1711   PHURINE 5.5 06/05/2021 1711   [place] 06/05/2021 1711   [place] NEGATIVE 06/05/2021 1711  BILIRUBINUR NEGATIVE 06/05/2021 1711   KETONESUR NEGATIVE 06/05/2021 1711   PROTEINUR NEGATIVE 06/05/2021 1711   NITRITE NEGATIVE 06/05/2021 1711   LEUKOCYTESUR NEGATIVE 06/05/2021 1711   Radiological Exams on Admission: CT ABDOMEN PELVIS WO CONTRAST  Result Date: 06/05/2021 CLINICAL DATA:  Acute nonlocalized abdominal pain. Pain onset this morning. EXAM: CT ABDOMEN AND PELVIS WITHOUT CONTRAST TECHNIQUE: Multidetector CT imaging of the abdomen and pelvis was performed following the standard protocol without IV contrast. COMPARISON:  Renal ultrasound 08/14/2020. FINDINGS: Lower chest: Suspected emphysema with coarse basilar lung markings. Mild cardiomegaly. Coronary artery calcifications. No acute airspace disease or pleural effusion. Hepatobiliary: Punctate granuloma in the left lobe of the liver. No evidence of focal liver lesion on this unenhanced exam. Gallbladder physiologically distended, no calcified stone. No biliary dilatation. Pancreas: Parenchymal atrophy. Coarse calcifications in the uncinate  process and pancreatic head. No ductal dilatation or peripancreatic fat stranding. Spleen: Normal in size without focal abnormality. Adrenals/Urinary Tract: Adrenal thickening without dominant adrenal nodule. Bilateral renal parenchymal cortical thinning. No hydronephrosis. Left renal cysts, including a 4.1 cm renal sinus cyst inferiorly. No visualized renal calculi. Portions of the urinary bladder obscured by streak artifact from right hip arthroplasty. The bladder is grossly normal. Stomach/Bowel: Small amount of enteric contrast in the distal esophagus. Stomach is distended with enteric contrast. Duodenal diverticulum. Enteric contrast reaches the distal small bowel. Mild fecalization of distal small bowel contents with prominent small bowel dimension of 2.5 cm. No evidence of obstruction or discrete transition point. No definite small bowel inflammation. Fluid/liquid stool in the ascending and proximal transverse colon. Difficult to assess for wall thickening given fluid-filled colon. There may be minimal pericolonic edema. Moderate volume of stool in the transverse and descending colon. Prominent sigmoid colonic diverticulosis. Portions of the sigmoid are obscured by streak artifact from right hip arthroplasty. No obvious diverticulitis or colonic inflammation. The appendix is not confidently visualized, there is no evidence of appendicitis. Vascular/Lymphatic: Moderate aorto bi-iliac atherosclerosis. No aortic aneurysm. No portal venous or mesenteric gas. No bulky abdominopelvic adenopathy. Reproductive: Prostate is obscured by streak artifact from right hip arthroplasty. The visualized prostate appears prominent spanning 5.1 cm transverse. Other: Small volume of free fluid in the right pericolic gutter and right lower quadrant. Trace upper abdominal ascites/free fluid. No free air or focal fluid collection. Tiny fat containing umbilical hernia. Query prior right inguinal hernia repair. Musculoskeletal:  Prominent Schmorl's node within superior endplate of L2. Mild diffuse degenerative change in the lumbar spine with scoliosis. Right hip arthroplasty. IMPRESSION: 1. Small volume of free fluid in the right pericolic gutter and right lower quadrant, nonspecific. There is minimal liquid stool in the ascending colon, with slightly prominent distal small bowel and fecalization of small bowel contents. Possibility of ascending colitis is raised with subsequent small bowel ileus. 2. Distal colonic diverticulosis without diverticulitis. 3. Bilateral renal parenchymal thinning. Left renal cysts. 4. Advanced aortic and branch atherosclerosis. Aortic Atherosclerosis (ICD10-I70.0). Electronically Signed   By: Keith Rake M.D.   On: 06/05/2021 20:07    EKG: Independently reviewed.  Vent. rate 67 BPM PR interval 259 ms QRS duration 139 ms QT/QTcB 468/495 ms P-R-T axes -12 -71 7 Sinus rhythm Ventricular premature complex Prolonged PR interval RBBB and LAFB  Assessment/Plan Principal Problem:   Ileus (HCC) Observation/telemetry. Keep NPO. Continue gentle IV fluids. Low-dose analgesics as needed. Antiemetics as needed. Famotidine 20 mg IVPB every 24 hours. Consider general surgery evaluation if no improvement.  Active Problems:   PAF (paroxysmal atrial fibrillation) (  HCC) CHA?DS?-VASc Score of at least 4. Not on anticoagulation. Continue beta-blocker for rate control.    Elevated troponin Minimally elevated and stable. Asymptomatic for ACS symptoms.    Hypothyroidism Continue levothyroxine 50 mcg p.o. daily.    Essential hypertension Continue metoprolol 12.5 mg p.o. twice daily.    CKD (chronic kidney disease), stage IV (HCC) Creatinine increased but not meeting criteria for AKI. Continue time-limited gentle IV hydration.    Normocytic anemia In the setting of CKD. Monitor H&H.    DVT prophylaxis: On apixaban. Code Status:   Full code. Family Communication:   Disposition  Plan:   Patient is from:  Home.  Anticipated DC to:  Home.  Anticipated DC date:  06/06/2021 or 06/07/2021.  Anticipated DC barriers: Clinical status.  Consults called:   Admission status:  Observation/telemetry.   Severity of Illness:  Reubin Milan MD [facility]  How to contact the [facility] Attending or Consulting provider [place] or covering provider during after hours [place], for this patient?   Check the care team in [facility] and look for a) attending/consulting [facility] provider listed and b) the [facility] team listed Log into [email or web address] and use [place]'s universal password to access. If you do not have the password, please contact the hospital operator. Locate the [facility] provider you are looking for under [facility] and page to a number that you can be directly reached. If you still have difficulty reaching the provider, please page the [facility] (Director on Call) for the Hospitalists listed on amion for assistance.  06/05/2021, 10:41 PM   This document was prepared using Dragon voice recognition software may contain some unintended transcription errors.

## 2021-06-06 ENCOUNTER — Encounter (HOSPITAL_COMMUNITY): Payer: Self-pay | Admitting: Internal Medicine

## 2021-06-06 DIAGNOSIS — I480 Paroxysmal atrial fibrillation: Secondary | ICD-10-CM | POA: Diagnosis not present

## 2021-06-06 DIAGNOSIS — R1084 Generalized abdominal pain: Secondary | ICD-10-CM

## 2021-06-06 DIAGNOSIS — K567 Ileus, unspecified: Secondary | ICD-10-CM | POA: Diagnosis present

## 2021-06-06 DIAGNOSIS — Z66 Do not resuscitate: Secondary | ICD-10-CM | POA: Diagnosis not present

## 2021-06-06 DIAGNOSIS — N184 Chronic kidney disease, stage 4 (severe): Secondary | ICD-10-CM

## 2021-06-06 DIAGNOSIS — H9190 Unspecified hearing loss, unspecified ear: Secondary | ICD-10-CM | POA: Diagnosis not present

## 2021-06-06 DIAGNOSIS — E039 Hypothyroidism, unspecified: Secondary | ICD-10-CM | POA: Diagnosis not present

## 2021-06-06 DIAGNOSIS — N400 Enlarged prostate without lower urinary tract symptoms: Secondary | ICD-10-CM | POA: Diagnosis not present

## 2021-06-06 DIAGNOSIS — G2581 Restless legs syndrome: Secondary | ICD-10-CM | POA: Diagnosis not present

## 2021-06-06 DIAGNOSIS — Z8249 Family history of ischemic heart disease and other diseases of the circulatory system: Secondary | ICD-10-CM | POA: Diagnosis not present

## 2021-06-06 DIAGNOSIS — D631 Anemia in chronic kidney disease: Secondary | ICD-10-CM | POA: Diagnosis not present

## 2021-06-06 DIAGNOSIS — R778 Other specified abnormalities of plasma proteins: Secondary | ICD-10-CM

## 2021-06-06 DIAGNOSIS — I129 Hypertensive chronic kidney disease with stage 1 through stage 4 chronic kidney disease, or unspecified chronic kidney disease: Secondary | ICD-10-CM | POA: Diagnosis not present

## 2021-06-06 DIAGNOSIS — E038 Other specified hypothyroidism: Secondary | ICD-10-CM

## 2021-06-06 DIAGNOSIS — Z79890 Hormone replacement therapy (postmenopausal): Secondary | ICD-10-CM | POA: Diagnosis not present

## 2021-06-06 DIAGNOSIS — Z8672 Personal history of thrombophlebitis: Secondary | ICD-10-CM | POA: Diagnosis not present

## 2021-06-06 DIAGNOSIS — Z20822 Contact with and (suspected) exposure to covid-19: Secondary | ICD-10-CM | POA: Diagnosis not present

## 2021-06-06 DIAGNOSIS — M1990 Unspecified osteoarthritis, unspecified site: Secondary | ICD-10-CM | POA: Diagnosis not present

## 2021-06-06 DIAGNOSIS — Z96641 Presence of right artificial hip joint: Secondary | ICD-10-CM | POA: Diagnosis not present

## 2021-06-06 DIAGNOSIS — Z87891 Personal history of nicotine dependence: Secondary | ICD-10-CM | POA: Diagnosis not present

## 2021-06-06 DIAGNOSIS — I10 Essential (primary) hypertension: Secondary | ICD-10-CM | POA: Diagnosis not present

## 2021-06-06 DIAGNOSIS — Z7982 Long term (current) use of aspirin: Secondary | ICD-10-CM | POA: Diagnosis not present

## 2021-06-06 DIAGNOSIS — Z79899 Other long term (current) drug therapy: Secondary | ICD-10-CM | POA: Diagnosis not present

## 2021-06-06 HISTORY — DX: Hypothyroidism, unspecified: E03.9

## 2021-06-06 LAB — CBC
HCT: 32 % — ABNORMAL LOW (ref 39.0–52.0)
Hemoglobin: 10.2 g/dL — ABNORMAL LOW (ref 13.0–17.0)
MCH: 30.1 pg (ref 26.0–34.0)
MCHC: 31.9 g/dL (ref 30.0–36.0)
MCV: 94.4 fL (ref 80.0–100.0)
Platelets: 148 10*3/uL — ABNORMAL LOW (ref 150–400)
RBC: 3.39 MIL/uL — ABNORMAL LOW (ref 4.22–5.81)
RDW: 12.9 % (ref 11.5–15.5)
WBC: 4.7 10*3/uL (ref 4.0–10.5)
nRBC: 0 % (ref 0.0–0.2)

## 2021-06-06 LAB — COMPREHENSIVE METABOLIC PANEL
ALT: 8 U/L (ref 0–44)
AST: 15 U/L (ref 15–41)
Albumin: 3 g/dL — ABNORMAL LOW (ref 3.5–5.0)
Alkaline Phosphatase: 74 U/L (ref 38–126)
Anion gap: 9 (ref 5–15)
BUN: 47 mg/dL — ABNORMAL HIGH (ref 8–23)
CO2: 23 mmol/L (ref 22–32)
Calcium: 8.5 mg/dL — ABNORMAL LOW (ref 8.9–10.3)
Chloride: 107 mmol/L (ref 98–111)
Creatinine, Ser: 1.66 mg/dL — ABNORMAL HIGH (ref 0.61–1.24)
GFR, Estimated: 37 mL/min — ABNORMAL LOW (ref >60)
Glucose, Bld: 107 mg/dL — ABNORMAL HIGH (ref 70–99)
Potassium: 3.9 mmol/L (ref 3.5–5.1)
Sodium: 139 mmol/L (ref 135–145)
Total Bilirubin: 0.6 mg/dL (ref 0.3–1.2)
Total Protein: 5.1 g/dL — ABNORMAL LOW (ref 6.5–8.1)

## 2021-06-06 LAB — PROCALCITONIN: Procalcitonin: <0.1 ng/mL

## 2021-06-06 LAB — GLUCOSE, CAPILLARY: Glucose-Capillary: 128 mg/dL — ABNORMAL HIGH (ref 70–99)

## 2021-06-06 LAB — LACTIC ACID, PLASMA: Lactic Acid, Venous: 1.1 mmol/L (ref 0.5–1.9)

## 2021-06-06 MED ORDER — ASPIRIN EC 81 MG PO TBEC
81.0000 mg | DELAYED_RELEASE_TABLET | ORAL | Status: DC
  Administered 2021-06-08: 81 mg via ORAL
  Filled 2021-06-06: qty 1

## 2021-06-06 MED ORDER — METOPROLOL TARTRATE 25 MG PO TABS
12.5000 mg | ORAL_TABLET | Freq: Two times a day (BID) | ORAL | Status: DC
  Administered 2021-06-06 – 2021-06-08 (×4): 12.5 mg via ORAL
  Filled 2021-06-06 (×4): qty 1

## 2021-06-06 MED ORDER — LEVOTHYROXINE SODIUM 50 MCG PO TABS
50.0000 ug | ORAL_TABLET | Freq: Every day | ORAL | Status: DC
  Administered 2021-06-07 – 2021-06-08 (×2): 50 ug via ORAL
  Filled 2021-06-06 (×2): qty 1

## 2021-06-06 MED ORDER — TAMSULOSIN HCL 0.4 MG PO CAPS
0.4000 mg | ORAL_CAPSULE | Freq: Every day | ORAL | Status: DC
  Administered 2021-06-07 – 2021-06-08 (×2): 0.4 mg via ORAL
  Filled 2021-06-06 (×2): qty 1

## 2021-06-06 NOTE — Progress Notes (Signed)
PROGRESS NOTE    Nathan Kim  AYT:[id number] DOB: 06/26/1920 DOA: 06/05/2021 PCP: [name], MD   Brief Narrative:   Nathan Kim is a 87 y.o. WM PMHx osteoarthritis, BPH, stage IV CKD, essential hypertension, paroxysmal atrial fibrillation, history of phlebitis   Presents due to complaints of abdominal pain since around 6 in the morning.  The patient's daughter stated that he woke up with intense pain that subsequently became less intense but then around 1000 the patient had recurrence of the pain with increased intensity and persistent nausea.  He had 1 episode of loose stools.  He was unable to have much oral intake for the day.  He was able to take his regular medications including his diuretic of furosemide 20 mg on alternating days with 40 mg.  The history was taken from the patient's daughter but he is able to answer simple questions.  He denied headache, chest or back pain.   ED Course: Initial vital signs were temperature 97.9 F, pulse 63, respiration 18, BP 131/65 mmHg O2 sat 100% on room air.  The patient Risi 500 mL of LR bolus.    Lab work: His urinalysis was normal.  Troponin was 67 and then 64 ng/L.  CBC showed a white count of 7.5, hemoglobin 11.9 g/dL platelets 167.  CMP had a glucose of 231, BUN of 55 and creatinine 2.03 mg/dL.  The rest of the CMP values were unremarkable.  Lipase was normal.  Lactic acid was normal twice.    Subjective: Afebrile overnight, A/O x4.  States positive diarrhea overnight.  Currently negative N/V/D   Assessment & Plan:  Covid vaccination; vaccinated 3/4   Principal Problem:   Ileus ([place]) Active Problems:   PAF (paroxysmal atrial fibrillation) ([place])   Essential hypertension   CKD (chronic kidney disease), stage IV (HCC)   Normocytic anemia   Elevated troponin   Hypothyroidism   Ileus (HCC) Observation/telemetry. Keep NPO. Low-dose analgesics as needed. Antiemetics as needed. Famotidine 20 mg IVPB every 24 hours. -9/3  LR 27ml/hr  -9/3 start clear liquid diet      PAF (paroxysmal atrial fibrillation) (HCC) CHA?DS?-VASc Score of at least 4. Not on anticoagulation. Continue beta-blocker for rate control.     Elevated troponin Minimally elevated and stable. Asymptomatic for ACS symptoms.     Hypothyroidism Continue levothyroxine 50 mcg p.o. daily.     Essential hypertension Continue metoprolol 12.5 mg p.o. twice daily.  CKD (chronic kidney disease), stage IV (HCC) Creatinine increased but not meeting criteria for AKI. Continue time-limited gentle IV hydration. Lab Results  Component Value Date   CREATININE 1.66 (H) 06/06/2021   CREATININE 1.73 (H) 06/05/2021   CREATININE 2.03 (H) 06/05/2021   CREATININE 2.26 (H) 10/22/2019   CREATININE 2.37 (H) 10/08/2019    Normocytic anemia In the setting of CKD. -Anemia panel pending -Monitor H/H Lab Results  Component Value Date   HGB 10.2 (L) 06/06/2021   HGB 11.9 (L) 06/05/2021   HGB 10.4 (L) 05/26/2017   HGB 9.7 (L) 05/25/2017   HGB 13.3 02/19/2016     DVT prophylaxis: Lovenox Code Status: DNR Family Communication: 9/3 daughter at bedside for discussion of plan of care all questions answered Status is: Inpatient    Dispo: The patient is from: Home              Anticipated d/c is to: Home              Anticipated d/c date is: 3 days  Patient currently is not medically stable to d/c.      Consultants:    Procedures/Significant Events:  9/2 CT abdomen/pelvis with contrast  Small volume of free fluid in the right pericolic gutter and right lower quadrant, nonspecific. There is minimal liquid stool in the ascending colon, with slightly prominent distal small bowel and fecalization of small bowel contents. Possibility of ascending colitis is raised with subsequent small bowel ileus. 2. Distal colonic diverticulosis without diverticulitis. 3. Bilateral renal parenchymal thinning. Left renal cysts. 4. Advanced aortic  and branch atherosclerosis.   I have personally reviewed and interpreted all radiology studies and my findings are as above.  VENTILATOR SETTINGS:    Cultures 9/2 SARS coronavirus negative 9/2 influenza A/B negative   Antimicrobials: Anti-infectives (From admission, onward)    None         Devices    LINES / TUBES:      Continuous Infusions:  famotidine (PEPCID) IV 20 mg (06/06/21 0008)   lactated ringers 88 mL/hr at 06/06/21 0008     Objective: Vitals:   06/05/21 2230 06/05/21 2315 06/05/21 2327 06/06/21 0336  BP: 102/68 123/78  108/67  Pulse: 88 86  63  Resp:  20  19  Temp:  97.6 F (36.4 C)  (!) 97.5 F (36.4 C)  TempSrc:  Oral  Oral  SpO2: 97% 99%  100%  Weight:   65 kg   Height:   5\' 11"  (1.803 m)     Intake/Output Summary (Last 24 hours) at 06/06/2021 4656 Last data filed at 06/06/2021 0600 Gross per 24 hour  Intake 770.44 ml  Output --  Net 770.44 ml   Filed Weights   06/05/21 1558 06/05/21 2327  Weight: 65.8 kg 65 kg    Examination:  General: A/O x4, No acute respiratory distress, hard of hearing Eyes: negative scleral hemorrhage, negative anisocoria, negative icterus ENT: Negative Runny nose, negative gingival bleeding, Neck:  Negative scars, masses, torticollis, lymphadenopathy, JVD Lungs: Clear to auscultation bilaterally without wheezes or crackles Cardiovascular: Regular rate and rhythm without murmur gallop or rub normal S1 and S2 Abdomen: negative abdominal pain, nondistended, positive soft, bowel sounds, no rebound, no ascites, no appreciable mass Extremities: No significant cyanosis, clubbing, or edema bilateral lower extremities Skin: Negative rashes, lesions, ulcers Psychiatric:  Negative depression, negative anxiety, negative fatigue, negative mania  Central nervous system:  Cranial nerves II through XII intact, tongue/uvula midline, all extremities muscle strength 5/5, sensation intact throughout, negative dysarthria,  negative expressive aphasia, negative receptive aphasia.  .     Data Reviewed: Care during the described time interval was provided by me .  I have reviewed this patient's available data, including medical history, events of note, physical examination, and all test results as part of my evaluation.   CBC: Recent Labs  Lab 06/05/21 1606 06/06/21 0455  WBC 7.5 4.7  HGB 11.9* 10.2*  HCT 38.9* 32.0*  MCV 95.1 94.4  PLT 167 812*   Basic Metabolic Panel: Recent Labs  Lab 06/05/21 1606 06/05/21 2112 06/06/21 0455  NA 137  --  139  K 4.5  --  3.9  CL 105  --  107  CO2 24  --  23  GLUCOSE 231*  --  107*  BUN 55*  --  47*  CREATININE 2.03* 1.73* 1.66*  CALCIUM 8.9  --  8.5*  MG  --  2.4  --    GFR: Estimated Creatinine Clearance: 22.8 mL/min (A) (by C-G formula based on SCr of  1.66 mg/dL (H)). Liver Function Tests: Recent Labs  Lab 06/05/21 1606 06/06/21 0455  AST 16 15  ALT 8 8  ALKPHOS 97 74  BILITOT 0.6 0.6  PROT 6.8 5.1*  ALBUMIN 4.0 3.0*   Recent Labs  Lab 06/05/21 1606  LIPASE 24   No results for input(s): AMMONIA in the last 168 hours. Coagulation Profile: No results for input(s): INR, PROTIME in the last 168 hours. Cardiac Enzymes: No results for input(s): CKTOTAL, CKMB, CKMBINDEX, TROPONINI in the last 168 hours. BNP (last 3 results) No results for input(s): PROBNP in the last 8760 hours. HbA1C: No results for input(s): HGBA1C in the last 72 hours. CBG: Recent Labs  Lab 06/06/21 0002  GLUCAP 128*   Lipid Profile: No results for input(s): CHOL, HDL, LDLCALC, TRIG, CHOLHDL, LDLDIRECT in the last 72 hours. Thyroid Function Tests: No results for input(s): TSH, T4TOTAL, FREET4, T3FREE, THYROIDAB in the last 72 hours. Anemia Panel: No results for input(s): VITAMINB12, FOLATE, FERRITIN, TIBC, IRON, RETICCTPCT in the last 72 hours. Urine analysis:    Component Value Date/Time   COLORURINE YELLOW 06/05/2021 [place] 06/05/2021 1711    LABSPEC 1.020 06/05/2021 1711   PHURINE 5.5 06/05/2021 1711   GLUCOSEU NEGATIVE 06/05/2021 1711   HGBUR NEGATIVE 06/05/2021 1711   BILIRUBINUR NEGATIVE 06/05/2021 1711   KETONESUR NEGATIVE 06/05/2021 1711   PROTEINUR NEGATIVE 06/05/2021 1711   NITRITE NEGATIVE 06/05/2021 1711   LEUKOCYTESUR NEGATIVE 06/05/2021 1711   Sepsis Labs: @LABRCNTIP (procalcitonin:4,lacticidven:4)  ) Recent Results (from the past 240 hour(s))  Resp Panel by RT-PCR (Flu A&B, Covid) Nasopharyngeal Swab     Status: None   Collection Time: 06/05/21  9:00 PM   Specimen: Nasopharyngeal Swab; Nasopharyngeal(NP) swabs in vial transport medium  Result Value Ref Range Status   SARS Coronavirus 2 by RT PCR NEGATIVE NEGATIVE Final    Comment: (NOTE) SARS-CoV-2 target nucleic acids are NOT DETECTED.  The SARS-CoV-2 RNA is generally detectable in upper respiratory specimens during the acute phase of infection. The lowest concentration of SARS-CoV-2 viral copies this assay can detect is 138 copies/mL. A negative result does not preclude SARS-Cov-2 infection and should not be used as the sole basis for treatment or other patient management decisions. A negative result may occur with  improper specimen collection/handling, submission of specimen other than nasopharyngeal swab, presence of viral mutation(s) within the areas targeted by this assay, and inadequate number of viral copies(<138 copies/mL). A negative result must be combined with clinical observations, patient history, and epidemiological information. The expected result is Negative.  Fact Sheet for Patients:  EntrepreneurPulse.com.au  Fact Sheet for Healthcare Providers:  IncredibleEmployment.be  This test is no t yet approved or cleared by the [place] FDA and  has been authorized for detection and/or diagnosis of SARS-CoV-2 by FDA under an Emergency Use Authorization (EUA). This EUA will remain  in effect  (meaning this test can be used) for the duration of the COVID-19 declaration under Section 564(b)(1) of the Act, 21 U.S.C.section 360bbb-3(b)(1), unless the authorization is terminated  or revoked sooner.       Influenza A by PCR NEGATIVE NEGATIVE Final   Influenza B by PCR NEGATIVE NEGATIVE Final    Comment: (NOTE) The Xpert Xpress SARS-CoV-2/FLU/RSV plus assay is intended as an aid in the diagnosis of influenza from Nasopharyngeal swab specimens and should not be used as a sole basis for treatment. Nasal washings and aspirates are unacceptable for Xpert Xpress SARS-CoV-2/FLU/RSV testing.  Fact Sheet for  Patients: EntrepreneurPulse.com.au  Fact Sheet for Healthcare Providers: IncredibleEmployment.be  This test is not yet approved or cleared by the [place] FDA and has been authorized for detection and/or diagnosis of SARS-CoV-2 by FDA under an Emergency Use Authorization (EUA). This EUA will remain in effect (meaning this test can be used) for the duration of the COVID-19 declaration under Section 564(b)(1) of the Act, 21 U.S.C. section 360bbb-3(b)(1), unless the authorization is terminated or revoked.  Performed at [facility], [address] [place], [place] [address]          Radiology Studies: CT ABDOMEN PELVIS WO CONTRAST  Result Date: 06/05/2021 CLINICAL DATA:  Acute nonlocalized abdominal pain. Pain onset this morning. EXAM: CT ABDOMEN AND PELVIS WITHOUT CONTRAST TECHNIQUE: Multidetector CT imaging of the abdomen and pelvis was performed following the standard protocol without IV contrast. COMPARISON:  Renal ultrasound 08/14/2020. FINDINGS: Lower chest: Suspected emphysema with coarse basilar lung markings. Mild cardiomegaly. Coronary artery calcifications. No acute airspace disease or pleural effusion. Hepatobiliary: Punctate granuloma in the left lobe of the liver. No evidence of focal liver lesion on this unenhanced exam.  Gallbladder physiologically distended, no calcified stone. No biliary dilatation. Pancreas: Parenchymal atrophy. Coarse calcifications in the uncinate process and pancreatic head. No ductal dilatation or peripancreatic fat stranding. Spleen: Normal in size without focal abnormality. Adrenals/Urinary Tract: Adrenal thickening without dominant adrenal nodule. Bilateral renal parenchymal cortical thinning. No hydronephrosis. Left renal cysts, including a 4.1 cm renal sinus cyst inferiorly. No visualized renal calculi. Portions of the urinary bladder obscured by streak artifact from right hip arthroplasty. The bladder is grossly normal. Stomach/Bowel: Small amount of enteric contrast in the distal esophagus. Stomach is distended with enteric contrast. Duodenal diverticulum. Enteric contrast reaches the distal small bowel. Mild fecalization of distal small bowel contents with prominent small bowel dimension of 2.5 cm. No evidence of obstruction or discrete transition point. No definite small bowel inflammation. Fluid/liquid stool in the ascending and proximal transverse colon. Difficult to assess for wall thickening given fluid-filled colon. There may be minimal pericolonic edema. Moderate volume of stool in the transverse and descending colon. Prominent sigmoid colonic diverticulosis. Portions of the sigmoid are obscured by streak artifact from right hip arthroplasty. No obvious diverticulitis or colonic inflammation. The appendix is not confidently visualized, there is no evidence of appendicitis. Vascular/Lymphatic: Moderate aorto bi-iliac atherosclerosis. No aortic aneurysm. No portal venous or mesenteric gas. No bulky abdominopelvic adenopathy. Reproductive: Prostate is obscured by streak artifact from right hip arthroplasty. The visualized prostate appears prominent spanning 5.1 cm transverse. Other: Small volume of free fluid in the right pericolic gutter and right lower quadrant. Trace upper abdominal  ascites/free fluid. No free air or focal fluid collection. Tiny fat containing umbilical hernia. Query prior right inguinal hernia repair. Musculoskeletal: Prominent Schmorl's node within superior endplate of L2. Mild diffuse degenerative change in the lumbar spine with scoliosis. Right hip arthroplasty. IMPRESSION: 1. Small volume of free fluid in the right pericolic gutter and right lower quadrant, nonspecific. There is minimal liquid stool in the ascending colon, with slightly prominent distal small bowel and fecalization of small bowel contents. Possibility of ascending colitis is raised with subsequent small bowel ileus. 2. Distal colonic diverticulosis without diverticulitis. 3. Bilateral renal parenchymal thinning. Left renal cysts. 4. Advanced aortic and branch atherosclerosis. Aortic Atherosclerosis (ICD10-I70.0). Electronically Signed   By: Keith Rake M.D.   On: 06/05/2021 20:07        Scheduled Meds:  aspirin EC  81 mg Oral QODAY  enoxaparin (LOVENOX) injection  30 mg Subcutaneous Q24H   levothyroxine  50 mcg Oral QAC breakfast   metoprolol tartrate  12.5 mg Oral BID   tamsulosin  0.4 mg Oral Daily   Continuous Infusions:  famotidine (PEPCID) IV 20 mg (06/06/21 0008)   lactated ringers 88 mL/hr at 06/06/21 0008     LOS: 0 days   The patient is critically ill with multiple organ systems failure and requires high complexity decision making for assessment and support, frequent evaluation and titration of therapies, application of advanced monitoring technologies and extensive interpretation of multiple databases. Critical Care Time devoted to patient care services described in this note  Time spent: 40 minutes     Jeri Jeanbaptiste, Geraldo Docker, MD [facility]   If 7PM-7AM, please contact night-coverage 06/06/2021, 9:18 AM

## 2021-06-06 NOTE — Progress Notes (Signed)
Via Dr, Olevia Bowens administer metoprolol tartrate (LOPRESSOR) tablet 12.5 mg    Per MAR. Pt still currently NPO.Will continue to monitor. Daughter at bedside. Pt states no pain or discomfort.

## 2021-06-07 ENCOUNTER — Encounter (HOSPITAL_COMMUNITY): Payer: Self-pay | Admitting: Emergency Medicine

## 2021-06-07 DIAGNOSIS — R778 Other specified abnormalities of plasma proteins: Secondary | ICD-10-CM | POA: Diagnosis not present

## 2021-06-07 DIAGNOSIS — I10 Essential (primary) hypertension: Secondary | ICD-10-CM | POA: Diagnosis not present

## 2021-06-07 DIAGNOSIS — K567 Ileus, unspecified: Secondary | ICD-10-CM | POA: Diagnosis not present

## 2021-06-07 DIAGNOSIS — N184 Chronic kidney disease, stage 4 (severe): Secondary | ICD-10-CM | POA: Diagnosis not present

## 2021-06-07 LAB — CBC WITH DIFFERENTIAL/PLATELET
Abs Immature Granulocytes: 0.02 10*3/uL (ref 0.00–0.07)
Basophils Absolute: 0 10*3/uL (ref 0.0–0.1)
Basophils Relative: 1 %
Eosinophils Absolute: 0.1 10*3/uL (ref 0.0–0.5)
Eosinophils Relative: 3 %
HCT: 35.7 % — ABNORMAL LOW (ref 39.0–52.0)
Hemoglobin: 11.3 g/dL — ABNORMAL LOW (ref 13.0–17.0)
Immature Granulocytes: 1 %
Lymphocytes Relative: 19 %
Lymphs Abs: 0.7 10*3/uL (ref 0.7–4.0)
MCH: 30 pg (ref 26.0–34.0)
MCHC: 31.7 g/dL (ref 30.0–36.0)
MCV: 94.7 fL (ref 80.0–100.0)
Monocytes Absolute: 0.5 10*3/uL (ref 0.1–1.0)
Monocytes Relative: 13 %
Neutro Abs: 2.5 10*3/uL (ref 1.7–7.7)
Neutrophils Relative %: 63 %
Platelets: 152 10*3/uL (ref 150–400)
RBC: 3.77 MIL/uL — ABNORMAL LOW (ref 4.22–5.81)
RDW: 13 % (ref 11.5–15.5)
WBC: 3.9 10*3/uL — ABNORMAL LOW (ref 4.0–10.5)
nRBC: 0 % (ref 0.0–0.2)

## 2021-06-07 LAB — IRON AND TIBC
Iron: 72 ug/dL (ref 45–182)
Saturation Ratios: 22 % (ref 17.9–39.5)
TIBC: 324 ug/dL (ref 250–450)
UIBC: 252 ug/dL

## 2021-06-07 LAB — COMPREHENSIVE METABOLIC PANEL
ALT: 7 U/L (ref 0–44)
AST: 16 U/L (ref 15–41)
Albumin: 3.1 g/dL — ABNORMAL LOW (ref 3.5–5.0)
Alkaline Phosphatase: 80 U/L (ref 38–126)
Anion gap: 9 (ref 5–15)
BUN: 36 mg/dL — ABNORMAL HIGH (ref 8–23)
CO2: 24 mmol/L (ref 22–32)
Calcium: 8.7 mg/dL — ABNORMAL LOW (ref 8.9–10.3)
Chloride: 108 mmol/L (ref 98–111)
Creatinine, Ser: 1.54 mg/dL — ABNORMAL HIGH (ref 0.61–1.24)
GFR, Estimated: 41 mL/min — ABNORMAL LOW (ref >60)
Glucose, Bld: 81 mg/dL (ref 70–99)
Potassium: 4.2 mmol/L (ref 3.5–5.1)
Sodium: 141 mmol/L (ref 135–145)
Total Bilirubin: 1 mg/dL (ref 0.3–1.2)
Total Protein: 5.3 g/dL — ABNORMAL LOW (ref 6.5–8.1)

## 2021-06-07 LAB — FERRITIN: Ferritin: 17 ng/mL — ABNORMAL LOW (ref 24–336)

## 2021-06-07 LAB — VITAMIN B12: Vitamin B-12: 236 pg/mL (ref 180–914)

## 2021-06-07 LAB — MAGNESIUM: Magnesium: 2.2 mg/dL (ref 1.7–2.4)

## 2021-06-07 LAB — RETICULOCYTES
Immature Retic Fract: 11.8 % (ref 2.3–15.9)
RBC.: 3.77 MIL/uL — ABNORMAL LOW (ref 4.22–5.81)
Retic Count, Absolute: 38.8 10*3/uL (ref 19.0–186.0)
Retic Ct Pct: 1 % (ref 0.4–3.1)

## 2021-06-07 LAB — PHOSPHORUS: Phosphorus: 2.9 mg/dL (ref 2.5–4.6)

## 2021-06-07 LAB — GLUCOSE, CAPILLARY: Glucose-Capillary: 110 mg/dL — ABNORMAL HIGH (ref 70–99)

## 2021-06-07 LAB — FOLATE: Folate: 12.4 ng/mL (ref >5.9)

## 2021-06-07 LAB — PROCALCITONIN: Procalcitonin: <0.1 ng/mL

## 2021-06-07 MED ORDER — LORAZEPAM 0.5 MG PO TABS
0.5000 mg | ORAL_TABLET | Freq: Every day | ORAL | Status: AC
  Administered 2021-06-07: 0.5 mg via ORAL
  Filled 2021-06-07: qty 1

## 2021-06-07 NOTE — Progress Notes (Signed)
[facility] night shift coverage note.  The patient requested something to help him sleep as he has been having restless legs.  Please see previous nursing staff progress note.  Lorazepam 0.5 mg p.o. x1 dose ordered.  Tennis Must, MD.

## 2021-06-07 NOTE — Progress Notes (Signed)
PROGRESS NOTE    Nathan Kim  AST:[id number] DOB: Mar 23, 1920 DOA: 06/05/2021 PCP: [name], MD   Brief Narrative:   Nathan Kim is a 87 y.o. WM PMHx osteoarthritis, BPH, stage IV CKD, essential hypertension, paroxysmal atrial fibrillation, history of phlebitis   Presents due to complaints of abdominal pain since around 6 in the morning.  The patient's daughter stated that he woke up with intense pain that subsequently became less intense but then around 1000 the patient had recurrence of the pain with increased intensity and persistent nausea.  He had 1 episode of loose stools.  He was unable to have much oral intake for the day.  He was able to take his regular medications including his diuretic of furosemide 20 mg on alternating days with 40 mg.  The history was taken from the patient's daughter but he is able to answer simple questions.  He denied headache, chest or back pain.   ED Course: Initial vital signs were temperature 97.9 F, pulse 63, respiration 18, BP 131/65 mmHg O2 sat 100% on room air.  The patient Risi 500 mL of LR bolus.    Lab work: His urinalysis was normal.  Troponin was 67 and then 64 ng/L.  CBC showed a white count of 7.5, hemoglobin 11.9 g/dL platelets 167.  CMP had a glucose of 231, BUN of 55 and creatinine 2.03 mg/dL.  The rest of the CMP values were unremarkable.  Lipase was normal.  Lactic acid was normal twice.    Subjective: 9/4 afebrile overnight A/O x4, states tolerate clear liquid diet negative N/V/D, would like to advance his diet to bland   Assessment & Plan:  Covid vaccination; vaccinated 3/4   Principal Problem:   Ileus ([place]) Active Problems:   PAF (paroxysmal atrial fibrillation) (HCC)   Essential hypertension   CKD (chronic kidney disease), stage IV (HCC)   Normocytic anemia   Elevated troponin   Hypothyroidism   Ileus (HCC) Observation/telemetry. Keep NPO. Low-dose analgesics as needed. Antiemetics as needed. Famotidine 20  mg IVPB every 24 hours. -9/3 LR 19ml/hr  -9/3 start clear liquid diet -9/4 procalcitonin WNL therefore would not start any antibiotics for possible colitis.  PAF (paroxysmal atrial fibrillation) (HCC) CHA?DS?-VASc Score of at least 4. Not on anticoagulation. Continue beta-blocker for rate control.     Elevated troponin Minimally elevated and stable. Asymptomatic for ACS symptoms.     Hypothyroidism Continue levothyroxine 50 mcg p.o. daily.     Essential hypertension Continue metoprolol 12.5 mg p.o. twice daily.  CKD (chronic kidney disease), stage IV (HCC) Creatinine increased but not meeting criteria for AKI. Continue time-limited gentle IV hydration. Lab Results  Component Value Date   CREATININE 1.54 (H) 06/07/2021   CREATININE 1.66 (H) 06/06/2021   CREATININE 1.73 (H) 06/05/2021   CREATININE 2.03 (H) 06/05/2021   CREATININE 2.26 (H) 10/22/2019    Normocytic anemia In the setting of CKD. -Anemia panel pending -Monitor H/H Lab Results  Component Value Date   HGB 11.3 (L) 06/07/2021   HGB 10.2 (L) 06/06/2021   HGB 11.9 (L) 06/05/2021   HGB 10.4 (L) 05/26/2017   HGB 9.7 (L) 05/25/2017     DVT prophylaxis: Lovenox Code Status: DNR Family Communication: 9/4 daughterx 2 at bedside for discussion of plan of care all questions answered Status is: Inpatient    Dispo: The patient is from: Home              Anticipated d/c is to: Home  Anticipated d/c date is: 3 days              Patient currently is not medically stable to d/c.      Consultants:    Procedures/Significant Events:  9/2 CT abdomen/pelvis with contrast  Small volume of free fluid in the right pericolic gutter and right lower quadrant, nonspecific. There is minimal liquid stool in the ascending colon, with slightly prominent distal small bowel and fecalization of small bowel contents. Possibility of ascending colitis is raised with subsequent small bowel ileus. 2. Distal colonic  diverticulosis without diverticulitis. 3. Bilateral renal parenchymal thinning. Left renal cysts. 4. Advanced aortic and branch atherosclerosis.   I have personally reviewed and interpreted all radiology studies and my findings are as above.  VENTILATOR SETTINGS:    Cultures 9/2 SARS coronavirus negative 9/2 influenza A/B negative   Antimicrobials: Anti-infectives (From admission, onward)    None         Devices    LINES / TUBES:      Continuous Infusions:  famotidine (PEPCID) IV Stopped (06/06/21 2310)     Objective: Vitals:   06/06/21 1341 06/06/21 2016 06/07/21 0414 06/07/21 1347  BP: 127/64 135/72 114/61 119/65  Pulse: (!) 59 72 64 60  Resp:  18 18 16   Temp: (!) 97.5 F (36.4 C) 98 F (36.7 C) 98.1 F (36.7 C) 97.6 F (36.4 C)  TempSrc: Oral Oral Oral Oral  SpO2: 99% 97% 94% 94%  Weight:      Height:        Intake/Output Summary (Last 24 hours) at 06/07/2021 1658 Last data filed at 06/07/2021 1400 Gross per 24 hour  Intake 290 ml  Output --  Net 290 ml    Filed Weights   06/05/21 1558 06/05/21 2327  Weight: 65.8 kg 65 kg    Examination:  General: A/O x4, No acute respiratory distress, hard of hearing Eyes: negative scleral hemorrhage, negative anisocoria, negative icterus ENT: Negative Runny nose, negative gingival bleeding, Neck:  Negative scars, masses, torticollis, lymphadenopathy, JVD Lungs: Clear to auscultation bilaterally without wheezes or crackles Cardiovascular: Regular rate and rhythm without murmur gallop or rub normal S1 and S2 Abdomen: negative abdominal pain, nondistended, positive soft, bowel sounds, no rebound, no ascites, no appreciable mass Extremities: No significant cyanosis, clubbing, or edema bilateral lower extremities Skin: Negative rashes, lesions, ulcers Psychiatric:  Negative depression, negative anxiety, negative fatigue, negative mania  Central nervous system:  Cranial nerves II through XII intact,  tongue/uvula midline, all extremities muscle strength 5/5, sensation intact throughout, negative dysarthria, negative expressive aphasia, negative receptive aphasia.  .     Data Reviewed: Care during the described time interval was provided by me .  I have reviewed this patient's available data, including medical history, events of note, physical examination, and all test results as part of my evaluation.   CBC: Recent Labs  Lab 06/05/21 1606 06/06/21 0455 06/07/21 0558  WBC 7.5 4.7 3.9*  NEUTROABS  --   --  2.5  HGB 11.9* 10.2* 11.3*  HCT 38.9* 32.0* 35.7*  MCV 95.1 94.4 94.7  PLT 167 148* 101    Basic Metabolic Panel: Recent Labs  Lab 06/05/21 1606 06/05/21 2112 06/06/21 0455 06/07/21 0558  NA 137  --  139 141  K 4.5  --  3.9 4.2  CL 105  --  107 108  CO2 24  --  23 24  GLUCOSE 231*  --  107* 81  BUN 55*  --  47*  36*  CREATININE 2.03* 1.73* 1.66* 1.54*  CALCIUM 8.9  --  8.5* 8.7*  MG  --  2.4  --  2.2  PHOS  --   --   --  2.9    GFR: Estimated Creatinine Clearance: 24.6 mL/min (A) (by C-G formula based on SCr of 1.54 mg/dL (H)). Liver Function Tests: Recent Labs  Lab 06/05/21 1606 06/06/21 0455 06/07/21 0558  AST 16 15 16   ALT 8 8 7   ALKPHOS 97 74 80  BILITOT 0.6 0.6 1.0  PROT 6.8 5.1* 5.3*  ALBUMIN 4.0 3.0* 3.1*    Recent Labs  Lab 06/05/21 1606  LIPASE 24    No results for input(s): AMMONIA in the last 168 hours. Coagulation Profile: No results for input(s): INR, PROTIME in the last 168 hours. Cardiac Enzymes: No results for input(s): CKTOTAL, CKMB, CKMBINDEX, TROPONINI in the last 168 hours. BNP (last 3 results) No results for input(s): PROBNP in the last 8760 hours. HbA1C: No results for input(s): HGBA1C in the last 72 hours. CBG: Recent Labs  Lab 06/06/21 0002  GLUCAP 128*    Lipid Profile: No results for input(s): CHOL, HDL, LDLCALC, TRIG, CHOLHDL, LDLDIRECT in the last 72 hours. Thyroid Function Tests: No results for input(s):  TSH, T4TOTAL, FREET4, T3FREE, THYROIDAB in the last 72 hours. Anemia Panel: Recent Labs    06/07/21 0558  VITAMINB12 236  FOLATE 12.4  FERRITIN 17*  TIBC 324  IRON 72  RETICCTPCT 1.0   Urine analysis:    Component Value Date/Time   COLORURINE YELLOW 06/05/2021 1711   APPEARANCEUR CLEAR 06/05/2021 1711   LABSPEC 1.020 06/05/2021 1711   PHURINE 5.5 06/05/2021 1711   GLUCOSEU NEGATIVE 06/05/2021 1711   HGBUR NEGATIVE 06/05/2021 1711   BILIRUBINUR NEGATIVE 06/05/2021 1711   KETONESUR NEGATIVE 06/05/2021 1711   PROTEINUR NEGATIVE 06/05/2021 1711   NITRITE NEGATIVE 06/05/2021 1711   LEUKOCYTESUR NEGATIVE 06/05/2021 1711   Sepsis Labs: @LABRCNTIP (procalcitonin:4,lacticidven:4)  ) Recent Results (from the past 240 hour(s))  Resp Panel by RT-PCR (Flu A&B, Covid) Nasopharyngeal Swab     Status: None   Collection Time: 06/05/21  9:00 PM   Specimen: Nasopharyngeal Swab; Nasopharyngeal(NP) swabs in vial transport medium  Result Value Ref Range Status   SARS Coronavirus 2 by RT PCR NEGATIVE NEGATIVE Final    Comment: (NOTE) SARS-CoV-2 target nucleic acids are NOT DETECTED.  The SARS-CoV-2 RNA is generally detectable in upper respiratory specimens during the acute phase of infection. The lowest concentration of SARS-CoV-2 viral copies this assay can detect is 138 copies/mL. A negative result does not preclude SARS-Cov-2 infection and should not be used as the sole basis for treatment or other patient management decisions. A negative result may occur with  improper specimen collection/handling, submission of specimen other than nasopharyngeal swab, presence of viral mutation(s) within the areas targeted by this assay, and inadequate number of viral copies(<138 copies/mL). A negative result must be combined with clinical observations, patient history, and epidemiological information. The expected result is Negative.  Fact Sheet for Patients:   EntrepreneurPulse.com.au  Fact Sheet for Healthcare Providers:  IncredibleEmployment.be  This test is no t yet approved or cleared by the [place] FDA and  has been authorized for detection and/or diagnosis of SARS-CoV-2 by FDA under an Emergency Use Authorization (EUA). This EUA will remain  in effect (meaning this test can be used) for the duration of the COVID-19 declaration under Section 564(b)(1) of the Act, 21 U.S.C.section 360bbb-3(b)(1), unless the authorization is terminated  or revoked sooner.       Influenza A by PCR NEGATIVE NEGATIVE Final   Influenza B by PCR NEGATIVE NEGATIVE Final    Comment: (NOTE) The Xpert Xpress SARS-CoV-2/FLU/RSV plus assay is intended as an aid in the diagnosis of influenza from Nasopharyngeal swab specimens and should not be used as a sole basis for treatment. Nasal washings and aspirates are unacceptable for Xpert Xpress SARS-CoV-2/FLU/RSV testing.  Fact Sheet for Patients: EntrepreneurPulse.com.au  Fact Sheet for Healthcare Providers: IncredibleEmployment.be  This test is not yet approved or cleared by the [place] FDA and has been authorized for detection and/or diagnosis of SARS-CoV-2 by FDA under an Emergency Use Authorization (EUA). This EUA will remain in effect (meaning this test can be used) for the duration of the COVID-19 declaration under Section 564(b)(1) of the Act, 21 U.S.C. section 360bbb-3(b)(1), unless the authorization is terminated or revoked.  Performed at [facility], [address], [place], [place] [address]          Radiology Studies: CT ABDOMEN PELVIS WO CONTRAST  Result Date: 06/05/2021 CLINICAL DATA:  Acute nonlocalized abdominal pain. Pain onset this morning. EXAM: CT ABDOMEN AND PELVIS WITHOUT CONTRAST TECHNIQUE: Multidetector CT imaging of the abdomen and pelvis was performed following the standard protocol without IV  contrast. COMPARISON:  Renal ultrasound 08/14/2020. FINDINGS: Lower chest: Suspected emphysema with coarse basilar lung markings. Mild cardiomegaly. Coronary artery calcifications. No acute airspace disease or pleural effusion. Hepatobiliary: Punctate granuloma in the left lobe of the liver. No evidence of focal liver lesion on this unenhanced exam. Gallbladder physiologically distended, no calcified stone. No biliary dilatation. Pancreas: Parenchymal atrophy. Coarse calcifications in the uncinate process and pancreatic head. No ductal dilatation or peripancreatic fat stranding. Spleen: Normal in size without focal abnormality. Adrenals/Urinary Tract: Adrenal thickening without dominant adrenal nodule. Bilateral renal parenchymal cortical thinning. No hydronephrosis. Left renal cysts, including a 4.1 cm renal sinus cyst inferiorly. No visualized renal calculi. Portions of the urinary bladder obscured by streak artifact from right hip arthroplasty. The bladder is grossly normal. Stomach/Bowel: Small amount of enteric contrast in the distal esophagus. Stomach is distended with enteric contrast. Duodenal diverticulum. Enteric contrast reaches the distal small bowel. Mild fecalization of distal small bowel contents with prominent small bowel dimension of 2.5 cm. No evidence of obstruction or discrete transition point. No definite small bowel inflammation. Fluid/liquid stool in the ascending and proximal transverse colon. Difficult to assess for wall thickening given fluid-filled colon. There may be minimal pericolonic edema. Moderate volume of stool in the transverse and descending colon. Prominent sigmoid colonic diverticulosis. Portions of the sigmoid are obscured by streak artifact from right hip arthroplasty. No obvious diverticulitis or colonic inflammation. The appendix is not confidently visualized, there is no evidence of appendicitis. Vascular/Lymphatic: Moderate aorto bi-iliac atherosclerosis. No aortic  aneurysm. No portal venous or mesenteric gas. No bulky abdominopelvic adenopathy. Reproductive: Prostate is obscured by streak artifact from right hip arthroplasty. The visualized prostate appears prominent spanning 5.1 cm transverse. Other: Small volume of free fluid in the right pericolic gutter and right lower quadrant. Trace upper abdominal ascites/free fluid. No free air or focal fluid collection. Tiny fat containing umbilical hernia. Query prior right inguinal hernia repair. Musculoskeletal: Prominent Schmorl's node within superior endplate of L2. Mild diffuse degenerative change in the lumbar spine with scoliosis. Right hip arthroplasty. IMPRESSION: 1. Small volume of free fluid in the right pericolic gutter and right lower quadrant, nonspecific. There is minimal liquid stool in the ascending colon, with  slightly prominent distal small bowel and fecalization of small bowel contents. Possibility of ascending colitis is raised with subsequent small bowel ileus. 2. Distal colonic diverticulosis without diverticulitis. 3. Bilateral renal parenchymal thinning. Left renal cysts. 4. Advanced aortic and branch atherosclerosis. Aortic Atherosclerosis (ICD10-I70.0). Electronically Signed   By: Keith Rake M.D.   On: 06/05/2021 20:07        Scheduled Meds:  aspirin EC  81 mg Oral QODAY   enoxaparin (LOVENOX) injection  30 mg Subcutaneous Q24H   levothyroxine  50 mcg Oral QAC breakfast   metoprolol tartrate  12.5 mg Oral BID   tamsulosin  0.4 mg Oral Daily   Continuous Infusions:  famotidine (PEPCID) IV Stopped (06/06/21 2310)     LOS: 1 day   The patient is critically ill with multiple organ systems failure and requires high complexity decision making for assessment and support, frequent evaluation and titration of therapies, application of advanced monitoring technologies and extensive interpretation of multiple databases. Critical Care Time devoted to patient care services described in this  note  Time spent: 40 minutes     Willetta York, Geraldo Docker, MD [facility]   If 7PM-7AM, please contact night-coverage 06/07/2021, 4:58 PM

## 2021-06-07 NOTE — Progress Notes (Signed)
PT is requesting something to help him rest. Daughter at bed side stated that pt "has restless legs" Sent message to on call. Awaiting orders. Will continue to monior

## 2021-06-08 DIAGNOSIS — D649 Anemia, unspecified: Secondary | ICD-10-CM

## 2021-06-08 DIAGNOSIS — N184 Chronic kidney disease, stage 4 (severe): Secondary | ICD-10-CM | POA: Diagnosis not present

## 2021-06-08 DIAGNOSIS — R778 Other specified abnormalities of plasma proteins: Secondary | ICD-10-CM | POA: Diagnosis not present

## 2021-06-08 DIAGNOSIS — K567 Ileus, unspecified: Secondary | ICD-10-CM | POA: Diagnosis not present

## 2021-06-08 DIAGNOSIS — E039 Hypothyroidism, unspecified: Secondary | ICD-10-CM

## 2021-06-08 DIAGNOSIS — I10 Essential (primary) hypertension: Secondary | ICD-10-CM | POA: Diagnosis not present

## 2021-06-08 LAB — COMPREHENSIVE METABOLIC PANEL
ALT: 9 U/L (ref 0–44)
AST: 17 U/L (ref 15–41)
Albumin: 3.1 g/dL — ABNORMAL LOW (ref 3.5–5.0)
Alkaline Phosphatase: 77 U/L (ref 38–126)
Anion gap: 7 (ref 5–15)
BUN: 29 mg/dL — ABNORMAL HIGH (ref 8–23)
CO2: 24 mmol/L (ref 22–32)
Calcium: 8.6 mg/dL — ABNORMAL LOW (ref 8.9–10.3)
Chloride: 108 mmol/L (ref 98–111)
Creatinine, Ser: 1.42 mg/dL — ABNORMAL HIGH (ref 0.61–1.24)
GFR, Estimated: 45 mL/min — ABNORMAL LOW (ref >60)
Glucose, Bld: 107 mg/dL — ABNORMAL HIGH (ref 70–99)
Potassium: 4.3 mmol/L (ref 3.5–5.1)
Sodium: 139 mmol/L (ref 135–145)
Total Bilirubin: 0.7 mg/dL (ref 0.3–1.2)
Total Protein: 5.3 g/dL — ABNORMAL LOW (ref 6.5–8.1)

## 2021-06-08 LAB — CBC WITH DIFFERENTIAL/PLATELET
Abs Immature Granulocytes: 0.02 10*3/uL (ref 0.00–0.07)
Basophils Absolute: 0 10*3/uL (ref 0.0–0.1)
Basophils Relative: 1 %
Eosinophils Absolute: 0.2 10*3/uL (ref 0.0–0.5)
Eosinophils Relative: 4 %
HCT: 33.8 % — ABNORMAL LOW (ref 39.0–52.0)
Hemoglobin: 10.6 g/dL — ABNORMAL LOW (ref 13.0–17.0)
Immature Granulocytes: 1 %
Lymphocytes Relative: 18 %
Lymphs Abs: 0.7 10*3/uL (ref 0.7–4.0)
MCH: 29.4 pg (ref 26.0–34.0)
MCHC: 31.4 g/dL (ref 30.0–36.0)
MCV: 93.6 fL (ref 80.0–100.0)
Monocytes Absolute: 0.5 10*3/uL (ref 0.1–1.0)
Monocytes Relative: 14 %
Neutro Abs: 2.5 10*3/uL (ref 1.7–7.7)
Neutrophils Relative %: 62 %
Platelets: 139 10*3/uL — ABNORMAL LOW (ref 150–400)
RBC: 3.61 MIL/uL — ABNORMAL LOW (ref 4.22–5.81)
RDW: 12.8 % (ref 11.5–15.5)
WBC: 3.9 10*3/uL — ABNORMAL LOW (ref 4.0–10.5)
nRBC: 0 % (ref 0.0–0.2)

## 2021-06-08 LAB — MAGNESIUM: Magnesium: 2.1 mg/dL (ref 1.7–2.4)

## 2021-06-08 LAB — PHOSPHORUS: Phosphorus: 2.4 mg/dL — ABNORMAL LOW (ref 2.5–4.6)

## 2021-06-08 LAB — PROCALCITONIN: Procalcitonin: <0.1 ng/mL

## 2021-06-08 MED ORDER — ONDANSETRON HCL 4 MG PO TABS: 4.0000 mg | ORAL_TABLET | Freq: Four times a day (QID) | ORAL | 0 refills | Status: DC | PRN

## 2021-06-08 NOTE — Care Management Important Message (Signed)
Important Message  Patient Details  Name: Nathan Kim MRN: [id number] Date of Birth: 1920-09-10   Medicare Important Message Given:  Yes     Tommy Medal 06/08/2021, 1:23 PM

## 2021-06-08 NOTE — Progress Notes (Signed)
Pt rested through out the night with daughter at bedside. No complaints of pain or discomfort. Pt able to make needs known and denies any other needs at this time. Will continue to monitor.

## 2021-06-08 NOTE — Discharge Summary (Signed)
Physician Discharge Summary  Darrly Loberg IRW:[id number] DOB: 19-Dec-1919 DOA: 06/05/2021  PCP: [name], MD  Admit date: 06/05/2021 Discharge date: 06/08/2021  Time spent: 35 minutes  Recommendations for Outpatient Follow-up:   Covid vaccination; vaccinated 3/4      Ileus ([place]) Observation/telemetry. Keep NPO. Low-dose analgesics as needed. Antiemetics as needed. Famotidine 20 mg IVPB every 24 hours. -9/3 LR 80ml/hr  -9/3 start clear liquid diet -9/4 procalcitonin WNL therefore would not start any antibiotics for possible colitis.   PAF (paroxysmal atrial fibrillation) (HCC) -CHA?DS?-VASc Score of at least 4. -Not on anticoagulation. -Continue metoprolol 12.5 mg p.o. twice daily  Essential hypertension -See PAF     Elevated troponin -Minimally elevated and stable. -Asymptomatic for ACS symptoms.     Hypothyroidism -Continue levothyroxine 50 mcg p.o. daily.   CKD (chronic kidney disease), stage IV (HCC)(baseline Cr 1.6-2.7) Creatinine increased but not meeting criteria for AKI. Continue time-limited gentle IV hydration. Lab Results  Component Value Date   CREATININE 1.42 (H) 06/08/2021   CREATININE 1.54 (H) 06/07/2021   CREATININE 1.66 (H) 06/06/2021   CREATININE 1.73 (H) 06/05/2021   CREATININE 2.03 (H) 06/05/2021  - Baseline  Normocytic anemia In the setting of CKD. -Anemia panel pending -Monitor H/H Lab Results  Component Value Date   HGB 10.6 (L) 06/08/2021   HGB 11.3 (L) 06/07/2021   HGB 10.2 (L) 06/06/2021   HGB 11.9 (L) 06/05/2021   HGB 10.4 (L) 05/26/2017      Discharge Diagnoses:  Principal Problem:   Ileus ([place]) Active Problems:   PAF (paroxysmal atrial fibrillation) ([place])   Essential hypertension   CKD (chronic kidney disease), stage IV (HCC)   Normocytic anemia   Elevated troponin   Hypothyroidism   Discharge Condition: Stable  Diet recommendation: [place] heart healthy  Filed Weights   06/05/21 1558 06/05/21 2327  Weight:  65.8 kg 65 kg    History of present illness:  Nathan Kim is a 87 y.o. WM PMHx osteoarthritis, BPH, stage IV CKD, essential hypertension, paroxysmal atrial fibrillation, history of phlebitis    Presents due to complaints of abdominal pain since around 6 in the morning.  The patient's daughter stated that he woke up with intense pain that subsequently became less intense but then around 1000 the patient had recurrence of the pain with increased intensity and persistent nausea.  He had 1 episode of loose stools.  He was unable to have much oral intake for the day.  He was able to take his regular medications including his diuretic of furosemide 20 mg on alternating days with 40 mg.  The history was taken from the patient's daughter but he is able to answer simple questions.  He denied headache, chest or back pain.   ED Course: Initial vital signs were temperature 97.9 F, pulse 63, respiration 18, BP 131/65 mmHg O2 sat 100% on room air.  The patient Risi 500 mL of LR bolus.    Hospital Course:  See above  Procedures:  9/2 CT abdomen/pelvis with contrast  Small volume of free fluid in the right pericolic gutter and right lower quadrant, nonspecific. There is minimal liquid stool in the ascending colon, with slightly prominent distal small bowel and fecalization of small bowel contents. Possibility of ascending colitis is raised with subsequent small bowel ileus. 2. Distal colonic diverticulosis without diverticulitis. 3. Bilateral renal parenchymal thinning. Left renal cysts. 4. Advanced aortic and branch atherosclerosis.  Cultures  9/2 SARS coronavirus negative 9/2 influenza A/B negative   Discharge  Exam: Vitals:   06/07/21 1347 06/07/21 2010 06/08/21 0421 06/08/21 1245  BP: 119/65 130/63 115/80 123/64  Pulse: 60 63 64 (!) 55  Resp: 16 18 18 18   Temp: 97.6 F (36.4 C) 97.8 F (36.6 C) (!) 97.5 F (36.4 C) 97.7 F (36.5 C)  TempSrc: Oral Oral Oral Oral  SpO2: 94% 98% 93%  97%  Weight:      Height:        General: A/O x4, No acute respiratory distress, hard of hearing Eyes: negative scleral hemorrhage, negative anisocoria, negative icterus ENT: Negative Runny nose, negative gingival bleeding, Neck:  Negative scars, masses, torticollis, lymphadenopathy, JVD Lungs: Clear to auscultation bilaterally without wheezes or crackles Cardiovascular: Regular rate and rhythm without murmur gallop or rub normal S1 and S2    Discharge Instructions   Allergies as of 06/08/2021   No Known Allergies      Medication List     TAKE these medications    aspirin EC 81 MG tablet Take 81 mg by mouth every other day.   furosemide 40 MG tablet Commonly known as: LASIX Take 40 mg daily alternating with 20 mg every other day   furosemide 20 MG tablet Commonly known as: LASIX Take 20 mg daily alternating with 40 mg every other day   levothyroxine 50 MCG tablet Commonly known as: SYNTHROID Take 50 mcg by mouth daily before breakfast.   metoprolol tartrate 25 MG tablet Commonly known as: LOPRESSOR TAKE ONE-HALF TABLET BY MOUTH TWICE DAILY. GENERIC EQUIVALENT FOR LOPRESSOR   ondansetron 4 MG tablet Commonly known as: ZOFRAN Take 1 tablet (4 mg total) by mouth every 6 (six) hours as needed for nausea.   tamsulosin 0.4 MG Caps capsule Commonly known as: FLOMAX Take 0.4 mg by mouth daily.   traMADol 50 MG tablet Commonly known as: ULTRAM Take 25 mg by mouth daily.   triamcinolone cream 0.1 % Commonly known as: KENALOG Apply 1 application topically 2 (two) times daily.   UNABLE TO FIND Med Name: Tawnya Crook WITH IRON       No Known Allergies    The results of significant diagnostics from this hospitalization (including imaging, microbiology, ancillary and laboratory) are listed below for reference.    Significant Diagnostic Studies: CT ABDOMEN PELVIS WO CONTRAST  Result Date: 06/05/2021 CLINICAL DATA:  Acute nonlocalized abdominal pain. Pain onset  this morning. EXAM: CT ABDOMEN AND PELVIS WITHOUT CONTRAST TECHNIQUE: Multidetector CT imaging of the abdomen and pelvis was performed following the standard protocol without IV contrast. COMPARISON:  Renal ultrasound 08/14/2020. FINDINGS: Lower chest: Suspected emphysema with coarse basilar lung markings. Mild cardiomegaly. Coronary artery calcifications. No acute airspace disease or pleural effusion. Hepatobiliary: Punctate granuloma in the left lobe of the liver. No evidence of focal liver lesion on this unenhanced exam. Gallbladder physiologically distended, no calcified stone. No biliary dilatation. Pancreas: Parenchymal atrophy. Coarse calcifications in the uncinate process and pancreatic head. No ductal dilatation or peripancreatic fat stranding. Spleen: Normal in size without focal abnormality. Adrenals/Urinary Tract: Adrenal thickening without dominant adrenal nodule. Bilateral renal parenchymal cortical thinning. No hydronephrosis. Left renal cysts, including a 4.1 cm renal sinus cyst inferiorly. No visualized renal calculi. Portions of the urinary bladder obscured by streak artifact from right hip arthroplasty. The bladder is grossly normal. Stomach/Bowel: Small amount of enteric contrast in the distal esophagus. Stomach is distended with enteric contrast. Duodenal diverticulum. Enteric contrast reaches the distal small bowel. Mild fecalization of distal small bowel contents with prominent small bowel dimension of  2.5 cm. No evidence of obstruction or discrete transition point. No definite small bowel inflammation. Fluid/liquid stool in the ascending and proximal transverse colon. Difficult to assess for wall thickening given fluid-filled colon. There may be minimal pericolonic edema. Moderate volume of stool in the transverse and descending colon. Prominent sigmoid colonic diverticulosis. Portions of the sigmoid are obscured by streak artifact from right hip arthroplasty. No obvious diverticulitis or  colonic inflammation. The appendix is not confidently visualized, there is no evidence of appendicitis. Vascular/Lymphatic: Moderate aorto bi-iliac atherosclerosis. No aortic aneurysm. No portal venous or mesenteric gas. No bulky abdominopelvic adenopathy. Reproductive: Prostate is obscured by streak artifact from right hip arthroplasty. The visualized prostate appears prominent spanning 5.1 cm transverse. Other: Small volume of free fluid in the right pericolic gutter and right lower quadrant. Trace upper abdominal ascites/free fluid. No free air or focal fluid collection. Tiny fat containing umbilical hernia. Query prior right inguinal hernia repair. Musculoskeletal: Prominent Schmorl's node within superior endplate of L2. Mild diffuse degenerative change in the lumbar spine with scoliosis. Right hip arthroplasty. IMPRESSION: 1. Small volume of free fluid in the right pericolic gutter and right lower quadrant, nonspecific. There is minimal liquid stool in the ascending colon, with slightly prominent distal small bowel and fecalization of small bowel contents. Possibility of ascending colitis is raised with subsequent small bowel ileus. 2. Distal colonic diverticulosis without diverticulitis. 3. Bilateral renal parenchymal thinning. Left renal cysts. 4. Advanced aortic and branch atherosclerosis. Aortic Atherosclerosis (ICD10-I70.0). Electronically Signed   By: Keith Rake M.D.   On: 06/05/2021 20:07    Microbiology: Recent Results (from the past 240 hour(s))  Resp Panel by RT-PCR (Flu A&B, Covid) Nasopharyngeal Swab     Status: None   Collection Time: 06/05/21  9:00 PM   Specimen: Nasopharyngeal Swab; Nasopharyngeal(NP) swabs in vial transport medium  Result Value Ref Range Status   SARS Coronavirus 2 by RT PCR NEGATIVE NEGATIVE Final    Comment: (NOTE) SARS-CoV-2 target nucleic acids are NOT DETECTED.  The SARS-CoV-2 RNA is generally detectable in upper respiratory specimens during the acute  phase of infection. The lowest concentration of SARS-CoV-2 viral copies this assay can detect is 138 copies/mL. A negative result does not preclude SARS-Cov-2 infection and should not be used as the sole basis for treatment or other patient management decisions. A negative result may occur with  improper specimen collection/handling, submission of specimen other than nasopharyngeal swab, presence of viral mutation(s) within the areas targeted by this assay, and inadequate number of viral copies(<138 copies/mL). A negative result must be combined with clinical observations, patient history, and epidemiological information. The expected result is Negative.  Fact Sheet for Patients:  EntrepreneurPulse.com.au  Fact Sheet for Healthcare Providers:  IncredibleEmployment.be  This test is no t yet approved or cleared by the [place] FDA and  has been authorized for detection and/or diagnosis of SARS-CoV-2 by FDA under an Emergency Use Authorization (EUA). This EUA will remain  in effect (meaning this test can be used) for the duration of the COVID-19 declaration under Section 564(b)(1) of the Act, 21 U.S.C.section 360bbb-3(b)(1), unless the authorization is terminated  or revoked sooner.       Influenza A by PCR NEGATIVE NEGATIVE Final   Influenza B by PCR NEGATIVE NEGATIVE Final    Comment: (NOTE) The Xpert Xpress SARS-CoV-2/FLU/RSV plus assay is intended as an aid in the diagnosis of influenza from Nasopharyngeal swab specimens and should not be used as a sole basis for treatment. Nasal  washings and aspirates are unacceptable for Xpert Xpress SARS-CoV-2/FLU/RSV testing.  Fact Sheet for Patients: EntrepreneurPulse.com.au  Fact Sheet for Healthcare Providers: IncredibleEmployment.be  This test is not yet approved or cleared by the [place] FDA and has been authorized for detection and/or diagnosis of  SARS-CoV-2 by FDA under an Emergency Use Authorization (EUA). This EUA will remain in effect (meaning this test can be used) for the duration of the COVID-19 declaration under Section 564(b)(1) of the Act, 21 U.S.C. section 360bbb-3(b)(1), unless the authorization is terminated or revoked.  Performed at [facility], [address], [place], [place] [address]      Labs: Basic Metabolic Panel: Recent Labs  Lab 06/05/21 1606 06/05/21 2112 06/06/21 0455 06/07/21 0558 06/08/21 0441  NA 137  --  139 141 139  K 4.5  --  3.9 4.2 4.3  CL 105  --  107 108 108  CO2 24  --  23 24 24   GLUCOSE 231*  --  107* 81 107*  BUN 55*  --  47* 36* 29*  CREATININE 2.03* 1.73* 1.66* 1.54* 1.42*  CALCIUM 8.9  --  8.5* 8.7* 8.6*  MG  --  2.4  --  2.2 2.1  PHOS  --   --   --  2.9 2.4*   Liver Function Tests: Recent Labs  Lab 06/05/21 1606 06/06/21 0455 06/07/21 0558 06/08/21 0441  AST 16 15 16 17   ALT 8 8 7 9   ALKPHOS 97 74 80 77  BILITOT 0.6 0.6 1.0 0.7  PROT 6.8 5.1* 5.3* 5.3*  ALBUMIN 4.0 3.0* 3.1* 3.1*   Recent Labs  Lab 06/05/21 1606  LIPASE 24   No results for input(s): AMMONIA in the last 168 hours. CBC: Recent Labs  Lab 06/05/21 1606 06/06/21 0455 06/07/21 0558 06/08/21 0441  WBC 7.5 4.7 3.9* 3.9*  NEUTROABS  --   --  2.5 2.5  HGB 11.9* 10.2* 11.3* 10.6*  HCT 38.9* 32.0* 35.7* 33.8*  MCV 95.1 94.4 94.7 93.6  PLT 167 148* 152 139*   Cardiac Enzymes: No results for input(s): CKTOTAL, CKMB, CKMBINDEX, TROPONINI in the last 168 hours. BNP: BNP (last 3 results) No results for input(s): BNP in the last 8760 hours.  ProBNP (last 3 results) No results for input(s): PROBNP in the last 8760 hours.  CBG: Recent Labs  Lab 06/06/21 0002 06/07/21 2011  GLUCAP 128* 110*       Signed:  Dia Crawford, MD [facility]

## 2021-06-10 ENCOUNTER — Encounter: Payer: Self-pay | Admitting: Cardiology

## 2021-06-10 ENCOUNTER — Ambulatory Visit: Payer: Medicare Other | Admitting: Cardiology

## 2021-06-10 VITALS — BP 130/66 | HR 68 | Ht 71.0 in | Wt 138.0 lb

## 2021-06-10 DIAGNOSIS — N184 Chronic kidney disease, stage 4 (severe): Secondary | ICD-10-CM

## 2021-06-10 DIAGNOSIS — I480 Paroxysmal atrial fibrillation: Secondary | ICD-10-CM

## 2021-06-10 DIAGNOSIS — I5032 Chronic diastolic (congestive) heart failure: Secondary | ICD-10-CM | POA: Diagnosis not present

## 2021-06-10 MED ORDER — FUROSEMIDE 20 MG PO TABS: ORAL_TABLET | ORAL | 3 refills | Status: DC

## 2021-06-10 MED ORDER — METOPROLOL TARTRATE 25 MG PO TABS: ORAL_TABLET | ORAL | 3 refills | Status: AC

## 2021-06-10 MED ORDER — METOPROLOL TARTRATE 25 MG PO TABS: ORAL_TABLET | ORAL | 3 refills | Status: DC

## 2021-06-10 NOTE — Progress Notes (Signed)
Cardiology Office Note  Date: 06/10/2021   ID: Nathan Kim, DOB 1919-12-29, MRN [id number]  PCP:  [name], MD  Cardiologist:  Rozann Lesches, MD Electrophysiologist:  None   Chief Complaint  Patient presents with   Cardiac follow-up    History of Present Illness: Nathan Kim is a 87 y.o. male last seen in March.  He is here today with his daughter for a follow-up visit. Records indicate recent hospitalization with ileus that improved with conservative measures.  Recent lab work is noted below.  He still does not have a very good appetite, reports intermittent diarrhea.  He has a visit scheduled with PCP next week.  I went over his medications, he has continued stable a stable cardiac regimen.  Heart rate is regular today, his recently ECG also showed sinus rhythm.  Weight is down 8 pounds since March.  Past Medical History:  Diagnosis Date   Arthritis    BPH (benign prostatic hyperplasia)    CKD (chronic kidney disease) stage 4, GFR 15-29 ml/min (HCC)    CKD (chronic kidney disease), stage IV ([place]) 06/05/2021   Essential hypertension    Hypothyroidism 06/06/2021   Paroxysmal atrial fibrillation (HCC)    Phlebitis 10/2016    Past Surgical History:  Procedure Laterality Date   BACK SURGERY     carpel tunnel     EYE SURGERY     HERNIA REPAIR     LUMBAR LAMINECTOMY/DECOMPRESSION MICRODISCECTOMY N/A 02/27/2016   Procedure: LUMBAR LAMINECTOMY/DECOMPRESSION MICRODISCECTOMY 3 LEVELS;  Surgeon: Kristeen Miss, MD;  Location: [facility];  Service: Neurosurgery;  Laterality: N/A;  L2-3 L3-4 L4-5 Laminectomy   TOTAL HIP ARTHROPLASTY Right 05/24/2017   Procedure: RIGHT TOTAL HIP ARTHROPLASTY ANTERIOR APPROACH;  Surgeon: Paralee Cancel, MD;  Location: [facility] ORS;  Service: Orthopedics;  Laterality: Right;  70 mins    Current Outpatient Medications  Medication Sig Dispense Refill   aspirin EC 81 MG tablet Take 81 mg by mouth every other day.      famotidine (PEPCID) 20 MG  tablet Take 20 mg by mouth daily.     furosemide (LASIX) 20 MG tablet Take 20 mg daily alternating with 40 mg every other day 90 tablet 3   furosemide (LASIX) 40 MG tablet Take 40 mg daily alternating with 20 mg every other day 45 tablet 3   levothyroxine (SYNTHROID) 50 MCG tablet Take 50 mcg by mouth daily before breakfast.     metoprolol tartrate (LOPRESSOR) 25 MG tablet TAKE ONE-HALF TABLET BY MOUTH TWICE DAILY. GENERIC EQUIVALENT FOR LOPRESSOR 90 tablet 3   tamsulosin (FLOMAX) 0.4 MG CAPS capsule Take 0.4 mg by mouth daily.     traMADol (ULTRAM) 50 MG tablet Take 25 mg by mouth daily.     triamcinolone cream (KENALOG) 0.1 % Apply 1 application topically 2 (two) times daily.     UNABLE TO FIND Med Name: Roland     No current facility-administered medications for this visit.   Allergies:  Patient has no known allergies.   ROS: No chest pain.  Physical Exam: VS:  BP 130/66   Pulse 68   Ht 5\' 11"  (1.803 m)   Wt 138 lb (62.6 kg)   SpO2 96%   BMI 19.25 kg/m , BMI Body mass index is 19.25 kg/m.  Wt Readings from Last 3 Encounters:  06/10/21 138 lb (62.6 kg)  06/05/21 143 lb 3.2 oz (65 kg)  12/04/20 145 lb (65.8 kg)    General: Elderly male,  patient appears comfortable at rest. HEENT: Conjunctiva and lids normal, wearing a mask. Neck: Supple, no elevated JVP or carotid bruits, no thyromegaly. Lungs: Clear to auscultation, nonlabored breathing at rest. Cardiac: Regular rate and rhythm, no S3, 1/6 systolic murmur, no pericardial rub. Extremities: No pitting edema.  ECG:  An ECG dated 06/05/2021 was personally reviewed today and demonstrated:  Sinus rhythm with right bundle branch block and left anterior fascicular block, prolonged PR interval, and PVC.  Recent Labwork: 06/08/2021: ALT 9; AST 17; BUN 29; Creatinine, Ser 1.42; Hemoglobin 10.6; Magnesium 2.1; Platelets 139; Potassium 4.3; Sodium 139   Other Studies Reviewed Today:  Echocardiogram 09/21/2019:  1. Left  ventricular ejection fraction, by visual estimation, is 55 to  60%. The left ventricle has normal function. There is moderately increased  left ventricular hypertrophy. Biplane LVEF and global longitudinal strain  measurements do not appear to be  accurate.   2. Left ventricular diastolic parameters are indeterminate.   3. The left ventricle has no regional wall motion abnormalities.   4. Global right ventricle has normal systolic function.The right  ventricular size is normal. No increase in right ventricular wall  thickness.   5. Left atrial size was normal.   6. Right atrial size was upper normal.   7. Mild mitral annular calcification.   8. The mitral valve is grossly normal. Trivial mitral valve  regurgitation.   9. The tricuspid valve is grossly normal. Tricuspid valve regurgitation  is trivial.  10. The aortic valve is tricuspid. Aortic valve regurgitation is not  visualized. Mild aortic valve sclerosis without stenosis.  11. The pulmonic valve was grossly normal. Pulmonic valve regurgitation is  trivial.  12. Normal pulmonary artery systolic pressure.  13. The inferior vena cava is normal in size with <50% respiratory  variability, suggesting right atrial pressure of 8 mmHg.  14. The tricuspid regurgitant velocity is 2.15 m/s, and with an assumed  right atrial pressure of 8 mmHg, the estimated right ventricular systolic  pressure is normal at 26.5 mmHg.   CT abdomen pelvis 06/05/2021: IMPRESSION: 1. Small volume of free fluid in the right pericolic gutter and right lower quadrant, nonspecific. There is minimal liquid stool in the ascending colon, with slightly prominent distal small bowel and fecalization of small bowel contents. Possibility of ascending colitis is raised with subsequent small bowel ileus. 2. Distal colonic diverticulosis without diverticulitis. 3. Bilateral renal parenchymal thinning. Left renal cysts. 4. Advanced aortic and branch atherosclerosis.    Aortic Atherosclerosis (ICD10-I70.0).  Assessment and Plan:  1.  Paroxysmal to persistent atrial fibrillation, CHA2DS2-VASc score of 3.  He is in sinus rhythm, recent ECG reviewed.  He is not anticoagulated based on previous discussions and otherwise remains on low-dose aspirin and metoprolol.  2.  CKD stage IV, recent creatinine 1.42 with normal potassium.  3.  History of chronic diastolic heart failure, remains on stable dose of Lasix, may need to cut back if appetite diminishes with decreased oral intake.  Recent renal function actually looks somewhat better.  Medication Adjustments/Labs and Tests Ordered: Current medicines are reviewed at length with the patient today.  Concerns regarding medicines are outlined above.   Tests Ordered: No orders of the defined types were placed in this encounter.   Medication Changes: No orders of the defined types were placed in this encounter.   Disposition:  Follow up  6 months.  Signed, Satira Sark, MD, [facility] 06/10/2021 4:34 PM    [facility]  [place], [place], [place] [address] Phone: [phone]; Fax: [phone]

## 2021-06-10 NOTE — Patient Instructions (Signed)

## 2021-07-20 ENCOUNTER — Encounter: Payer: Self-pay | Admitting: Gastroenterology

## 2021-08-02 ENCOUNTER — Encounter (HOSPITAL_COMMUNITY): Payer: Self-pay | Admitting: Emergency Medicine

## 2021-08-02 ENCOUNTER — Inpatient Hospital Stay (HOSPITAL_COMMUNITY)
Admission: EM | Admit: 2021-08-02 | Discharge: 2021-08-12 | DRG: 329 | Disposition: A | Discharge status: Home Health | Payer: Medicare Other | Attending: Student | Admitting: Student

## 2021-08-02 ENCOUNTER — Emergency Department (HOSPITAL_COMMUNITY): Payer: Medicare Other

## 2021-08-02 ENCOUNTER — Other Ambulatory Visit: Payer: Self-pay

## 2021-08-02 DIAGNOSIS — N1832 Chronic kidney disease, stage 3b: Secondary | ICD-10-CM | POA: Diagnosis present

## 2021-08-02 DIAGNOSIS — I440 Atrioventricular block, first degree: Secondary | ICD-10-CM | POA: Diagnosis present

## 2021-08-02 DIAGNOSIS — E43 Unspecified severe protein-calorie malnutrition: Secondary | ICD-10-CM | POA: Insufficient documentation

## 2021-08-02 DIAGNOSIS — K56609 Unspecified intestinal obstruction, unspecified as to partial versus complete obstruction: Secondary | ICD-10-CM | POA: Diagnosis not present

## 2021-08-02 DIAGNOSIS — I480 Paroxysmal atrial fibrillation: Secondary | ICD-10-CM | POA: Diagnosis present

## 2021-08-02 DIAGNOSIS — Z79899 Other long term (current) drug therapy: Secondary | ICD-10-CM

## 2021-08-02 DIAGNOSIS — I10 Essential (primary) hypertension: Secondary | ICD-10-CM | POA: Diagnosis present

## 2021-08-02 DIAGNOSIS — C180 Malignant neoplasm of cecum: Secondary | ICD-10-CM | POA: Diagnosis not present

## 2021-08-02 DIAGNOSIS — Z20822 Contact with and (suspected) exposure to covid-19: Secondary | ICD-10-CM | POA: Diagnosis present

## 2021-08-02 DIAGNOSIS — N179 Acute kidney failure, unspecified: Secondary | ICD-10-CM | POA: Diagnosis present

## 2021-08-02 DIAGNOSIS — R188 Other ascites: Secondary | ICD-10-CM | POA: Diagnosis present

## 2021-08-02 DIAGNOSIS — C189 Malignant neoplasm of colon, unspecified: Secondary | ICD-10-CM

## 2021-08-02 DIAGNOSIS — Z681 Body mass index (BMI) 19 or less, adult: Secondary | ICD-10-CM

## 2021-08-02 DIAGNOSIS — E039 Hypothyroidism, unspecified: Secondary | ICD-10-CM | POA: Diagnosis present

## 2021-08-02 DIAGNOSIS — Z66 Do not resuscitate: Secondary | ICD-10-CM | POA: Diagnosis present

## 2021-08-02 DIAGNOSIS — K5669 Other partial intestinal obstruction: Secondary | ICD-10-CM | POA: Diagnosis present

## 2021-08-02 DIAGNOSIS — C772 Secondary and unspecified malignant neoplasm of intra-abdominal lymph nodes: Secondary | ICD-10-CM | POA: Diagnosis present

## 2021-08-02 DIAGNOSIS — D649 Anemia, unspecified: Secondary | ICD-10-CM | POA: Diagnosis present

## 2021-08-02 DIAGNOSIS — N184 Chronic kidney disease, stage 4 (severe): Secondary | ICD-10-CM | POA: Diagnosis present

## 2021-08-02 DIAGNOSIS — Z87891 Personal history of nicotine dependence: Secondary | ICD-10-CM

## 2021-08-02 DIAGNOSIS — N400 Enlarged prostate without lower urinary tract symptoms: Secondary | ICD-10-CM | POA: Diagnosis present

## 2021-08-02 DIAGNOSIS — I130 Hypertensive heart and chronic kidney disease with heart failure and stage 1 through stage 4 chronic kidney disease, or unspecified chronic kidney disease: Secondary | ICD-10-CM | POA: Diagnosis present

## 2021-08-02 DIAGNOSIS — Z96641 Presence of right artificial hip joint: Secondary | ICD-10-CM | POA: Diagnosis present

## 2021-08-02 DIAGNOSIS — I5032 Chronic diastolic (congestive) heart failure: Secondary | ICD-10-CM | POA: Diagnosis present

## 2021-08-02 DIAGNOSIS — K56600 Partial intestinal obstruction, unspecified as to cause: Secondary | ICD-10-CM

## 2021-08-02 HISTORY — DX: Family history of other specified conditions: Z84.89

## 2021-08-02 LAB — CBC
HCT: 34.4 % — ABNORMAL LOW (ref 39.0–52.0)
Hemoglobin: 10.9 g/dL — ABNORMAL LOW (ref 13.0–17.0)
MCH: 28.7 pg (ref 26.0–34.0)
MCHC: 31.7 g/dL (ref 30.0–36.0)
MCV: 90.5 fL (ref 80.0–100.0)
Platelets: 189 10*3/uL (ref 150–400)
RBC: 3.8 MIL/uL — ABNORMAL LOW (ref 4.22–5.81)
RDW: 14.2 % (ref 11.5–15.5)
WBC: 3.6 10*3/uL — ABNORMAL LOW (ref 4.0–10.5)
nRBC: 0 % (ref 0.0–0.2)

## 2021-08-02 LAB — COMPREHENSIVE METABOLIC PANEL
ALT: 7 U/L (ref 0–44)
AST: 16 U/L (ref 15–41)
Albumin: 3.3 g/dL — ABNORMAL LOW (ref 3.5–5.0)
Alkaline Phosphatase: 69 U/L (ref 38–126)
Anion gap: 8 (ref 5–15)
BUN: 61 mg/dL — ABNORMAL HIGH (ref 8–23)
CO2: 25 mmol/L (ref 22–32)
Calcium: 8.8 mg/dL — ABNORMAL LOW (ref 8.9–10.3)
Chloride: 107 mmol/L (ref 98–111)
Creatinine, Ser: 2.13 mg/dL — ABNORMAL HIGH (ref 0.61–1.24)
GFR, Estimated: 27 mL/min — ABNORMAL LOW (ref >60)
Glucose, Bld: 135 mg/dL — ABNORMAL HIGH (ref 70–99)
Potassium: 3.7 mmol/L (ref 3.5–5.1)
Sodium: 140 mmol/L (ref 135–145)
Total Bilirubin: 0.7 mg/dL (ref 0.3–1.2)
Total Protein: 5.8 g/dL — ABNORMAL LOW (ref 6.5–8.1)

## 2021-08-02 LAB — URINALYSIS, ROUTINE W REFLEX MICROSCOPIC
Bilirubin Urine: NEGATIVE
Glucose, UA: NEGATIVE mg/dL
Hgb urine dipstick: NEGATIVE
Ketones, ur: NEGATIVE mg/dL
Leukocytes,Ua: NEGATIVE
Nitrite: NEGATIVE
Protein, ur: NEGATIVE mg/dL
Specific Gravity, Urine: 1.015 (ref 1.005–1.030)
pH: 5 (ref 5.0–8.0)

## 2021-08-02 LAB — RESP PANEL BY RT-PCR (FLU A&B, COVID) ARPGX2
Influenza A by PCR: NEGATIVE
Influenza B by PCR: NEGATIVE
SARS Coronavirus 2 by RT PCR: NEGATIVE

## 2021-08-02 LAB — MAGNESIUM: Magnesium: 2.3 mg/dL (ref 1.7–2.4)

## 2021-08-02 LAB — LIPASE, BLOOD: Lipase: 26 U/L (ref 11–51)

## 2021-08-02 LAB — TSH: TSH: 8.632 u[IU]/mL — ABNORMAL HIGH (ref 0.350–4.500)

## 2021-08-02 LAB — LACTIC ACID, PLASMA
Lactic Acid, Venous: 1.1 mmol/L (ref 0.5–1.9)
Lactic Acid, Venous: 0.9 mmol/L (ref 0.5–1.9)

## 2021-08-02 MED ORDER — SODIUM CHLORIDE 0.9 % IV BOLUS
1000.0000 mL | Freq: Once | INTRAVENOUS | Status: AC
  Administered 2021-08-02: 1000 mL via INTRAVENOUS

## 2021-08-02 MED ORDER — FAMOTIDINE IN NACL 20-0.9 MG/50ML-% IV SOLN: 20.0000 mg | INTRAVENOUS | Status: DC

## 2021-08-02 MED ORDER — SODIUM CHLORIDE 0.9 % IV SOLN: INTRAVENOUS | Status: AC

## 2021-08-02 MED ORDER — ASPIRIN EC 81 MG PO TBEC
81.0000 mg | DELAYED_RELEASE_TABLET | ORAL | Status: DC
  Administered 2021-08-03: 81 mg via ORAL
  Filled 2021-08-02: qty 1

## 2021-08-02 MED ORDER — ACETAMINOPHEN 325 MG PO TABS
650.0000 mg | ORAL_TABLET | Freq: Four times a day (QID) | ORAL | Status: DC | PRN
  Administered 2021-08-03 – 2021-08-12 (×5): 650 mg via ORAL
  Filled 2021-08-02 (×6): qty 2

## 2021-08-02 MED ORDER — MORPHINE SULFATE (PF) 2 MG/ML IV SOLN
2.0000 mg | Freq: Once | INTRAVENOUS | Status: AC
  Administered 2021-08-02: 2 mg via INTRAVENOUS
  Filled 2021-08-02: qty 1

## 2021-08-02 MED ORDER — LEVOTHYROXINE SODIUM 50 MCG PO TABS
50.0000 ug | ORAL_TABLET | Freq: Every day | ORAL | Status: DC
  Administered 2021-08-03 – 2021-08-12 (×9): 50 ug via ORAL
  Filled 2021-08-02 (×9): qty 1

## 2021-08-02 MED ORDER — ONDANSETRON HCL 4 MG PO TABS: 4.0000 mg | ORAL_TABLET | Freq: Four times a day (QID) | ORAL | Status: DC | PRN

## 2021-08-02 MED ORDER — TAMSULOSIN HCL 0.4 MG PO CAPS
0.4000 mg | ORAL_CAPSULE | Freq: Every day | ORAL | Status: DC
  Administered 2021-08-02 – 2021-08-12 (×10): 0.4 mg via ORAL
  Filled 2021-08-02 (×10): qty 1

## 2021-08-02 MED ORDER — HYDROMORPHONE HCL 1 MG/ML IJ SOLN
0.5000 mg | INTRAMUSCULAR | Status: DC | PRN
  Administered 2021-08-05: 0.5 mg via INTRAVENOUS
  Filled 2021-08-02: qty 0.5

## 2021-08-02 MED ORDER — ACETAMINOPHEN 650 MG RE SUPP: 650.0000 mg | Freq: Four times a day (QID) | RECTAL | Status: DC | PRN

## 2021-08-02 MED ORDER — METOPROLOL TARTRATE 12.5 MG HALF TABLET
12.5000 mg | ORAL_TABLET | Freq: Two times a day (BID) | ORAL | Status: DC
  Administered 2021-08-02 – 2021-08-05 (×5): 12.5 mg via ORAL
  Filled 2021-08-02 (×8): qty 1

## 2021-08-02 MED ORDER — ONDANSETRON HCL 4 MG/2ML IJ SOLN: 4.0000 mg | Freq: Four times a day (QID) | INTRAMUSCULAR | Status: DC | PRN

## 2021-08-02 NOTE — ED Notes (Signed)
Attempted to call report, was told that pt could not go to the floor due to pt being an obs pt. Called AC to verify.

## 2021-08-02 NOTE — H&P (Signed)
History and Physical    Nathan Kim UJW:[id number] DOB: 1920-09-28 DOA: 08/02/2021  PCP: [name], MD Consultants:  cardiology: Dr. Domenic Polite   nephrology: Dr. Theador Hawthorne  Patient coming from:  Home - lives with wife   Chief Complaint: abdominal pain, N/V, poor PO intake.   HPI: Nathan Kim is a 87 y.o. male with medical history significant of HTN, PAF, diastolic CHF, CKD stage IV, normocytic anemia, hypothyroidism, hx of first degree heart block who presented to ED with complaints of abdominal pain, poor appetite N/V and weight loss. He was discharged home in September and was fine for 2 weeks with normal appetite/diet and no abdominal pain. Then he would have episodes that started out weekly that consist of abdominal pain and not being able to eat. Abdominal pain is in the middle of his belly. Pain described as a 10/10 and sharp/stabbing. No radiation. Pain could last 24 hours. As the weeks progressed he would have more frequent episodes and then started to have nausea and vomiting. Over the past week he has been sick 4 times with nausea, vomiting, stomach pain and has not been able to eat. He has lost over 10 pounds since his last hospital admit.   Never had a colonoscopy. Last BM was early this AM around 2AM and then had a "massive BM" at 3AM. No blood in stool. He is having flatus, but none today. He has not thrown up today. He has had nothing to eat or drink since 9pm last night.   Has only had hernia repair surgery (unsure where or when). They think over 20 years ago.   Was admitted 06/05/21 for ileus. Placed on observation, ivf. No intervention needed, treated conservatively.   He specifically denies any fevers or chills, headaches or vision changes, chest pain or palpitations, shortness of breath or cough, leg swelling, dysuria or other urinary symptoms.   ED Course: vitals: Afebrile, blood pressure 120/57, heart rate 81, respiratory rate 18, oxygen 100% on room air. Pertinent  labs: WBC 3.6, hemoglobin 10.9, BUN 61, creatinine 2.13 CT abdomen pelvis: Dilation of distal small bowel loops with fluid in the lumen.  Suggest partial small bowel obstruction which may be due to adhesions or internal hernia.  Small pelvic ascites. In ED patient made n.p.o., given normal saline bolus and morphine and TRH was asked to admit.  Review of Systems: As per HPI; otherwise review of systems reviewed and negative.   Ambulatory Status:  Ambulates with cane    Past Medical History:  Diagnosis Date   Arthritis    BPH (benign prostatic hyperplasia)    CKD (chronic kidney disease) stage 4, GFR 15-29 ml/min (HCC)    CKD (chronic kidney disease), stage IV ([place]) 06/05/2021   Essential hypertension    Hypothyroidism 06/06/2021   Paroxysmal atrial fibrillation (HCC)    Phlebitis 10/2016    Past Surgical History:  Procedure Laterality Date   BACK SURGERY     carpel tunnel     EYE SURGERY     HERNIA REPAIR     LUMBAR LAMINECTOMY/DECOMPRESSION MICRODISCECTOMY N/A 02/27/2016   Procedure: LUMBAR LAMINECTOMY/DECOMPRESSION MICRODISCECTOMY 3 LEVELS;  Surgeon: Kristeen Miss, MD;  Location: [facility];  Service: Neurosurgery;  Laterality: N/A;  L2-3 L3-4 L4-5 Laminectomy   TOTAL HIP ARTHROPLASTY Right 05/24/2017   Procedure: RIGHT TOTAL HIP ARTHROPLASTY ANTERIOR APPROACH;  Surgeon: Paralee Cancel, MD;  Location: [facility];  Service: Orthopedics;  Laterality: Right;  70 mins    Social History   Socioeconomic History  Marital status: Married    Spouse name: Not on file   Number of children: Not on file   Years of education: Not on file   Highest education level: Not on file  Occupational History   Not on file  Tobacco Use   Smoking status: Former    Packs/day: 0.50    Years: 50.00    Pack years: 25.00    Types: Cigarettes    Start date: 07/03/1929    Quit date: 07/04/1979    Years since quitting: 42.1   Smokeless tobacco: Never  Vaping Use   Vaping Use: Never used  Substance and  Sexual Activity   Alcohol use: No    Alcohol/week: 0.0 standard drinks   Drug use: No   Sexual activity: Not on file  Other Topics Concern   Not on file  Social History Narrative   Not on file   Social Determinants of Health   Financial Resource Strain: Not on file  Food Insecurity: Not on file  Transportation Needs: Not on file  Physical Activity: Not on file  Stress: Not on file  Social Connections: Not on file  Intimate Partner Violence: Not on file    No Known Allergies  No family history on file.  Prior to Admission medications   Medication Sig Start Date End Date Taking? Authorizing Provider  aspirin EC 81 MG tablet Take 81 mg by mouth every other day.     [provider]  famotidine (PEPCID) 20 MG tablet Take 20 mg by mouth daily.    [provider]  furosemide (LASIX) 20 MG tablet Take 20 mg daily alternating with 40 mg every other day 06/10/21   Satira Sark, MD  furosemide (LASIX) 40 MG tablet Take 40 mg daily alternating with 20 mg every other day 01/06/21   Satira Sark, MD  levothyroxine (SYNTHROID) 50 MCG tablet Take 50 mcg by mouth daily before breakfast.    [provider]  metoprolol tartrate (LOPRESSOR) 25 MG tablet TAKE ONE-HALF TABLET BY MOUTH TWICE DAILY. GENERIC EQUIVALENT FOR LOPRESSOR 06/10/21   Satira Sark, MD  tamsulosin (FLOMAX) 0.4 MG CAPS capsule Take 0.4 mg by mouth daily.    [provider]  traMADol (ULTRAM) 50 MG tablet Take 25 mg by mouth daily.    [provider]  triamcinolone cream (KENALOG) 0.1 % Apply 1 application topically 2 (two) times daily. 04/22/21   [provider]  UNABLE TO FIND Med Name: [place]    [provider]    Physical Exam: Vitals:   08/02/21 0900 08/02/21 0945 08/02/21 1000 08/02/21 1030  BP: 108/65 115/65 (!) 97/59 (!) 95/59  Pulse: 91 77 64 79  Resp: 17 (!) 22 (!) 21 13  Temp: 98.3 F (36.8 C)     TempSrc:      SpO2: 100%  100% 90% 98%     General:  Appears calm and comfortable and is in NAD Eyes:  PERRL, EOMI, normal lids, iris ENT:  hard of hearing, lips & tongue, mmm; appropriate dentition Neck:  no LAD, masses or thyromegaly; no carotid bruits Cardiovascular:  RRR, no m/r/g. No LE edema.  Respiratory:   CTA bilaterally with no wheezes/rales/rhonchi.  Normal respiratory effort. Abdomen:  soft, TTP in umbilical area. Hypoactive BS. No rebound or guarding, ND,  Back:   normal alignment, no CVAT Skin:  no rash or induration seen on limited exam. Varicose veins in BLE.  Musculoskeletal:  grossly normal tone  BUE/BLE, good ROM, no bony abnormality Lower extremity:  No LE edema.  Limited foot exam with no ulcerations.  2+ distal pulses. Psychiatric:  grossly normal mood and affect, speech fluent and appropriate, AOx3 Neurologic:  CN 2-12 grossly intact, moves all extremities in coordinated fashion, sensation intact   Radiological Exams on Admission: Independently reviewed - see discussion in A/P where applicable  CT ABDOMEN PELVIS WO CONTRAST  Result Date: 08/02/2021 CLINICAL DATA:  Abdominal pain EXAM: CT ABDOMEN AND PELVIS WITHOUT CONTRAST TECHNIQUE: Multidetector CT imaging of the abdomen and pelvis was performed following the standard protocol without IV contrast. COMPARISON:  06/05/2021 FINDINGS: Lower chest: Coronary artery calcifications are seen Hepatobiliary: Liver measures 14 cm. No focal abnormality is seen. There is no dilation of bile ducts. Gallbladder is slightly distended. There is no significant wall thickening. Pancreas: There is atrophy. There are coarse calcifications, more so in the head of the pancreas suggesting chronic pancreatitis. There are no loculated fluid collections in or around the pancreas. Spleen: Unremarkable Adrenals/Urinary Tract: Adrenals are not enlarged. There is no hydronephrosis. There are few smooth marginated fluid density lesions in the left kidney largest measuring 4.5  cm. There are no renal or ureteral stones. Urinary bladder is partly obscured by beam hardening artifacts limiting evaluation. Stomach/Bowel: Stomach is not distended. Small diverticulum is noted along the inner margin of duodenum. There is mild dilation of distal small bowel loops measuring up to 3.2 cm. There is fluid in the distal small bowel loops. There is no significant distention of colon. Multiple diverticula are seen in colon without signs of diverticulitis. Vascular/Lymphatic: Extrinsic calcifications are seen in aorta and its major branches. No new significant lymphadenopathy is seen. Reproductive: Prostate is enlarged. Other: There is no pneumoperitoneum.  Small pelvic ascites is seen. Musculoskeletal: There is previous right hip arthroplasty. Degenerative changes are noted in lumbar spine and left hip. Slight decrease in the height of upper endplate of body of L2 vertebra has not changed. IMPRESSION: There is dilation of distal small bowel loops with fluid in the lumen. Findings suggest partial small bowel obstruction which may be due to adhesions or internal hernia. Small pelvic ascites is seen. There is no pneumoperitoneum. There is no hydronephrosis. Diverticulosis of colon without signs of focal diverticulitis. Other findings as described in the body of the report. Electronically Signed   By: Elmer Picker M.D.   On: 08/02/2021 11:34    EKG: pending    Labs on Admission: I have personally reviewed the available labs and imaging studies at the time of the admission.  Pertinent labs:  WBC 3.6,  hemoglobin 10.9, (9.7-11.3) BUN 61,  creatinine 2.13    Assessment/Plan Principal Problem:   SBO (small bowel obstruction) ([place]) -87 year-old male with previous history of ileus presenting with progressively worsening abdominal pain, poor p.o. intake with weight loss, nausea/vomiting found to have small bowel obstruction. -Admit to telemetry and make n.p.o. -no clinical findings for  emergent surgery at this time  -Gentle IV fluid hydration-75cc/hour x 12 hours  -Pain medication low dose. Tylenol and Dilaudid in setting of CKD.  -encourage ambulation -Monitor electrolytes -General surgery consulted, especially since recent ileus and now SBO. NG tube per surgery, not actively vomiting.   Active Problems:   PAF (paroxysmal atrial fibrillation) (HCC) -telemetry -rate controlled on beta blocker. BP soft, but appears at baseline looking back through chart.  -chads-vasc2 score of 3  -on no anticoagulation with joint decision making with his cardiologist.  -continue 81mg  ASA  CKD (chronic kidney disease), stage IV ([place]) -secondary to diabetes per nephrology notes -check a1c, appears to be diet controlled now.  -baseline creatinine around 2.1 per nephrology notes, although was 1.7 back in May.  -near baseline, no AKI -gentle, time limited IVF while NPO in setting of poor PO intake, N/V/D.    Chronic diastolic CHF -Appears dry on exam -Gentle, time-limited IV fluids at 75 cc/hour. Reassess volume status, need for IVF and re-initiating lasix.  -Strict intake and output and daily weights -Last echo 12/20: EF 55 to 60% with normal LV function.  Moderately increased LVH.  Diastolic parameters indeterminate. -hold lasix at this time in setting of increased creatinine, poor PO intake.     Essential hypertension -soft BP with systolic in 23-762, appears at baseline for him.  -continue metoprolol   ACD in setting of CKD -at goal/baseline -iron studies done in 06/2021.     Hypothyroidism -check TSH -continue synthroid   BPH Continue flomax   There is no height or weight on file to calculate BMI.   Level of care: Telemetry Medical DVT prophylaxis:   SCDs Code Status:  DNR - confirmed with patient/family Family Communication: daughter present: cheryl ratliff  Disposition Plan:  The patient is from: home  Anticipated d/c is to: home   Requires inpatient  hospitalization and is at significant risk of worsening, requires IVF, repeat imaging and constant monitoring, assessment and MDM with specialists.    Patient is currently: stable, but ill  Consults called: surgery  Admission status:  observation   Dragon dictation used in completing this note.   Orma Flaming MD [facility]   How to contact the [facility] Attending or Consulting provider [place] or covering provider during after hours [place], for this patient?  Check the care team in [facility] and look for a) attending/consulting [facility] provider listed and b) the [facility] team listed Log into [email or web address] and use [place]'s universal password to access. If you do not have the password, please contact the hospital operator. Locate the [facility] provider you are looking for under [facility] and page to a number that you can be directly reached. If you still have difficulty reaching the provider, please page the [facility] (Director on Call) for the Hospitalists listed on amion for assistance.   08/02/2021, 12:53 PM

## 2021-08-02 NOTE — Progress Notes (Signed)
Pt arrived from ED, VSS, CHG complete, oriented to unit, call light within reach.   Chrisandra Carota, RN 08/02/2021 6:53 PM

## 2021-08-02 NOTE — Consult Note (Signed)
Consulting Physician: [place]  Referring Provider: Caryl Ada, PA-C ER provider  Chief Complaint: Abdominal pain, poor appetite, weight loss  Reason for Consult: Partial small bowel obstruction   Subjective   HPI: Nathan Kim is an 87 y.o. male who is here for abdominal pain, poor appetite and weight loss.  He has been having episodes of waves of abdominal pain across this mid abdomen for the last few months.  This started back in early September.  The waves of pain have become more frequent and more severe.  The pain is associated with nausea, and vomiting.  He had a bad episode over the last 24 hours which brought him into the ER today.  His last bowel movement was at 3 or 4 AM this morning and was huge.  He feels he gets backed up then it all comes out at once.  Past Medical History:  Diagnosis Date   Arthritis    BPH (benign prostatic hyperplasia)    CKD (chronic kidney disease) stage 4, GFR 15-29 ml/min (HCC)    CKD (chronic kidney disease), stage IV ([place]) 06/05/2021   Essential hypertension    Hypothyroidism 06/06/2021   Paroxysmal atrial fibrillation (HCC)    Phlebitis 10/2016    Past Surgical History:  Procedure Laterality Date   BACK SURGERY     carpel tunnel     EYE SURGERY     HERNIA REPAIR     LUMBAR LAMINECTOMY/DECOMPRESSION MICRODISCECTOMY N/A 02/27/2016   Procedure: LUMBAR LAMINECTOMY/DECOMPRESSION MICRODISCECTOMY 3 LEVELS;  Surgeon: Kristeen Miss, MD;  Location: [facility];  Service: Neurosurgery;  Laterality: N/A;  L2-3 L3-4 L4-5 Laminectomy   TOTAL HIP ARTHROPLASTY Right 05/24/2017   Procedure: RIGHT TOTAL HIP ARTHROPLASTY ANTERIOR APPROACH;  Surgeon: Paralee Cancel, MD;  Location: [facility];  Service: Orthopedics;  Laterality: Right;  70 mins    No family history on file.  Social:  reports that he quit smoking about 42 years ago. His smoking use included cigarettes. He started smoking about 92 years ago. He has a 25.00 pack-year smoking  history. He has never used smokeless tobacco. He reports that he does not drink alcohol and does not use drugs.  Allergies: No Known Allergies  Medications: Current Outpatient Medications  Medication Instructions   aspirin EC 81 mg, Oral, Every other day   famotidine (PEPCID) 20 mg, Oral, Daily   furosemide (LASIX) 20 MG tablet Take 20 mg daily alternating with 40 mg every other day   furosemide (LASIX) 40 MG tablet Take 40 mg daily alternating with 20 mg every other day   levothyroxine (SYNTHROID) 50 mcg, Oral, Daily before breakfast   metoprolol tartrate (LOPRESSOR) 25 MG tablet TAKE ONE-HALF TABLET BY MOUTH TWICE DAILY. GENERIC EQUIVALENT FOR LOPRESSOR   tamsulosin (FLOMAX) 0.4 mg, Oral, Daily   traMADol (ULTRAM) 25 mg, Oral, Daily   triamcinolone cream (KENALOG) 0.1 % 1 application, Topical, 2 times daily   UNABLE TO FIND Med Name: [place] - all of the below systems have been reviewed with the patient and positives are indicated with bold text General: chills, fever or night sweats Eyes: blurry vision or double vision ENT: epistaxis or sore throat Allergy/Immunology: itchy/watery eyes or nasal congestion Hematologic/Lymphatic: bleeding problems, blood clots or swollen lymph nodes Endocrine: temperature intolerance or unexpected weight changes Breast: new or changing breast lumps or nipple discharge Resp: cough, shortness of breath, or wheezing CV: chest pain or dyspnea on exertion GI: as per HPI GU:  dysuria, trouble voiding, or hematuria MSK: joint pain or joint stiffness Neuro: TIA or stroke symptoms Derm: pruritus and skin lesion changes Psych: anxiety and depression  Objective   PE Blood pressure (!) 95/59, pulse 79, temperature 98.3 F (36.8 C), resp. rate 13, SpO2 98 %. Constitutional: NAD; conversant; no deformities Eyes: Moist conjunctiva; no lid lag; anicteric; PERRL Neck: Trachea midline; no thyromegaly Lungs: Normal respiratory effort; no  tactile fremitus CV: RRR; no palpable thrills; no pitting edema GI: Abd Soft, non-tender, non-distended; no palpable hepatosplenomegaly MSK: Normal range of motion of extremities; no clubbing/cyanosis Psychiatric: Appropriate affect; alert and oriented x3 Lymphatic: No palpable cervical or axillary lymphadenopathy  Results for orders placed or performed during the hospital encounter of 08/02/21 (from the past 24 hour(s))  Lipase, blood     Status: None   Collection Time: 08/02/21  6:36 AM  Result Value Ref Range   Lipase 26 11 - 51 U/L  Comprehensive metabolic panel     Status: Abnormal   Collection Time: 08/02/21  6:36 AM  Result Value Ref Range   Sodium 140 135 - 145 mmol/L   Potassium 3.7 3.5 - 5.1 mmol/L   Chloride 107 98 - 111 mmol/L   CO2 25 22 - 32 mmol/L   Glucose, Bld 135 (H) 70 - 99 mg/dL   BUN 61 (H) 8 - 23 mg/dL   Creatinine, Ser 2.13 (H) 0.61 - 1.24 mg/dL   Calcium 8.8 (L) 8.9 - 10.3 mg/dL   Total Protein 5.8 (L) 6.5 - 8.1 g/dL   Albumin 3.3 (L) 3.5 - 5.0 g/dL   AST 16 15 - 41 U/L   ALT 7 0 - 44 U/L   Alkaline Phosphatase 69 38 - 126 U/L   Total Bilirubin 0.7 0.3 - 1.2 mg/dL   GFR, Estimated 27 (L) >60 mL/min   Anion gap 8 5 - 15  CBC     Status: Abnormal   Collection Time: 08/02/21  6:36 AM  Result Value Ref Range   WBC 3.6 (L) 4.0 - 10.5 K/uL   RBC 3.80 (L) 4.22 - 5.81 MIL/uL   Hemoglobin 10.9 (L) 13.0 - 17.0 g/dL   HCT 34.4 (L) 39.0 - 52.0 %   MCV 90.5 80.0 - 100.0 fL   MCH 28.7 26.0 - 34.0 pg   MCHC 31.7 30.0 - 36.0 g/dL   RDW 14.2 11.5 - 15.5 %   Platelets 189 150 - 400 K/uL   nRBC 0.0 0.0 - 0.2 %     Imaging Orders         CT ABDOMEN PELVIS WO CONTRAST    There is dilation of distal small bowel loops with fluid in the lumen. Findings suggest partial small bowel obstruction which may be due to adhesions or internal hernia. Small pelvic ascites is seen. There is no pneumoperitoneum. There is no hydronephrosis. Diverticulosis of colon without signs of  focal diverticulitis.  Assessment and Plan   Nathan Kim is an 87 y.o. male with abdominal pain and concern for possible bowel obstruction on CT.  His symptoms are concerning for progression of a partial bowel obstruction, though this may be an ileus or even a functional issue of the colon.  He has had a hernia repair which sounds like it was an open inguinal repair years ago - this would be unlikely to cause significant adhesive disease for a bowel obstruction.  This could be a developing malignant obstruction with his advanced age.  Currently he is relatively asymptomatic.  He is being resuscitated by the medical service.  I recommend we watch his progress clinically with the patient NPO.  Hold off on NG as there was no significant gastric distention on CT and he is not nauseated.  A contrast study may provide more diagnostic information or  prove therapeutic, we could consider this in the morning.      ICD-10-CM   1. Partial small bowel obstruction (HCC)  K56.600        Felicie Morn, MD  [facility] Use [email or web address] to contact on call provider

## 2021-08-02 NOTE — ED Triage Notes (Signed)
Patient reports pain across his abdomen yesterday with emesis and diarrhea .

## 2021-08-02 NOTE — ED Provider Notes (Signed)
[place] EMERGENCY DEPARTMENT Provider Note   CSN: [id number] Arrival date & time: 08/02/21  0555     History Chief Complaint  Patient presents with   Abdominal Pain    Nathan Kim is a 87 y.o. male.  Pt has been having abdominal pain for the past 1 month.  Pt was admitted in September with an ileus.  Pt has had pain on and off.  Pain worse since yesterday    The history is provided by the patient. No language interpreter was used.  Abdominal Pain Pain location:  Generalized Pain quality: aching   Pain radiates to:  Does not radiate Pain severity:  Moderate Duration:  1 day Timing:  Constant Progression:  Worsening Chronicity:  New Relieved by:  Nothing Worsened by:  Nothing Ineffective treatments:  None tried Associated symptoms: nausea and vomiting       Past Medical History:  Diagnosis Date   Arthritis    BPH (benign prostatic hyperplasia)    CKD (chronic kidney disease) stage 4, GFR 15-29 ml/min (HCC)    CKD (chronic kidney disease), stage IV ([place]) 06/05/2021   Essential hypertension    Hypothyroidism 06/06/2021   Paroxysmal atrial fibrillation ([place])    Phlebitis 10/2016    Patient Active Problem List   Diagnosis Date Noted   Hypothyroidism 06/06/2021   Ileus ([place]) 06/05/2021   CKD (chronic kidney disease), stage IV ([place]) 06/05/2021   Normocytic anemia 06/05/2021   Elevated troponin 06/05/2021   S/P right THA, AA 05/24/2017   Lumbar stenosis with neurogenic claudication 02/27/2016   PAF (paroxysmal atrial fibrillation) ([place]) 02/25/2016   Essential hypertension 02/25/2016   Preoperative clearance 02/25/2016   PVC's (premature ventricular contractions) 02/25/2016   First degree heart block 02/25/2016    Past Surgical History:  Procedure Laterality Date   BACK SURGERY     carpel tunnel     EYE SURGERY     HERNIA REPAIR     LUMBAR LAMINECTOMY/DECOMPRESSION MICRODISCECTOMY N/A 02/27/2016   Procedure: LUMBAR  LAMINECTOMY/DECOMPRESSION MICRODISCECTOMY 3 LEVELS;  Surgeon: Kristeen Miss, MD;  Location: [facility];  Service: Neurosurgery;  Laterality: N/A;  L2-3 L3-4 L4-5 Laminectomy   TOTAL HIP ARTHROPLASTY Right 05/24/2017   Procedure: RIGHT TOTAL HIP ARTHROPLASTY ANTERIOR APPROACH;  Surgeon: Paralee Cancel, MD;  Location: [facility];  Service: Orthopedics;  Laterality: Right;  70 mins       No family history on file.  Social History   Tobacco Use   Smoking status: Former    Packs/day: 0.50    Years: 50.00    Pack years: 25.00    Types: Cigarettes    Start date: 07/03/1929    Quit date: 07/04/1979    Years since quitting: 42.1   Smokeless tobacco: Never  Vaping Use   Vaping Use: Never used  Substance Use Topics   Alcohol use: No    Alcohol/week: 0.0 standard drinks   Drug use: No    Home Medications Prior to Admission medications   Medication Sig Start Date End Date Taking? Authorizing Provider  aspirin EC 81 MG tablet Take 81 mg by mouth every other day.     [provider]  famotidine (PEPCID) 20 MG tablet Take 20 mg by mouth daily.    [provider]  furosemide (LASIX) 20 MG tablet Take 20 mg daily alternating with 40 mg every other day 06/10/21   Satira Sark, MD  furosemide (LASIX) 40 MG tablet Take 40 mg daily alternating with 20  mg every other day 01/06/21   Satira Sark, MD  levothyroxine (SYNTHROID) 50 MCG tablet Take 50 mcg by mouth daily before breakfast.    [provider]  metoprolol tartrate (LOPRESSOR) 25 MG tablet TAKE ONE-HALF TABLET BY MOUTH TWICE DAILY. GENERIC EQUIVALENT FOR LOPRESSOR 06/10/21   Satira Sark, MD  tamsulosin (FLOMAX) 0.4 MG CAPS capsule Take 0.4 mg by mouth daily.    [provider]  traMADol (ULTRAM) 50 MG tablet Take 25 mg by mouth daily.    [provider]  triamcinolone cream (KENALOG) 0.1 % Apply 1 application topically 2 (two) times daily. 04/22/21   [provider]  UNABLE TO FIND  Med Name: [place]    [provider]    Allergies    Patient has no known allergies.  Review of Systems   Review of Systems  Gastrointestinal:  Positive for abdominal pain, nausea and vomiting.  All other systems reviewed and are negative.  Physical Exam Updated Vital Signs BP (!) 95/59   Pulse 79   Temp 98.3 F (36.8 C)   Resp 13   SpO2 98%   Physical Exam Vitals reviewed.  Constitutional:      Appearance: He is well-developed.  Cardiovascular:     Rate and Rhythm: Normal rate.  Pulmonary:     Effort: Pulmonary effort is normal.  Abdominal:     General: Abdomen is flat. Bowel sounds are normal.     Palpations: Abdomen is soft.     Tenderness: There is abdominal tenderness.  Skin:    General: Skin is warm.  Neurological:     General: No focal deficit present.     Mental Status: He is alert.    ED Results / Procedures / Treatments   Labs (all labs ordered are listed, but only abnormal results are displayed) Labs Reviewed  COMPREHENSIVE METABOLIC PANEL - Abnormal; Notable for the following components:      Result Value   Glucose, Bld 135 (*)    BUN 61 (*)    Creatinine, Ser 2.13 (*)    Calcium 8.8 (*)    Total Protein 5.8 (*)    Albumin 3.3 (*)    GFR, Estimated 27 (*)    All other components within normal limits  CBC - Abnormal; Notable for the following components:   WBC 3.6 (*)    RBC 3.80 (*)    Hemoglobin 10.9 (*)    HCT 34.4 (*)    All other components within normal limits  LIPASE, BLOOD  URINALYSIS, ROUTINE W REFLEX MICROSCOPIC    EKG None  Radiology CT ABDOMEN PELVIS WO CONTRAST  Result Date: 08/02/2021 CLINICAL DATA:  Abdominal pain EXAM: CT ABDOMEN AND PELVIS WITHOUT CONTRAST TECHNIQUE: Multidetector CT imaging of the abdomen and pelvis was performed following the standard protocol without IV contrast. COMPARISON:  06/05/2021 FINDINGS: Lower chest: Coronary artery calcifications are seen Hepatobiliary: Liver measures  14 cm. No focal abnormality is seen. There is no dilation of bile ducts. Gallbladder is slightly distended. There is no significant wall thickening. Pancreas: There is atrophy. There are coarse calcifications, more so in the head of the pancreas suggesting chronic pancreatitis. There are no loculated fluid collections in or around the pancreas. Spleen: Unremarkable Adrenals/Urinary Tract: Adrenals are not enlarged. There is no hydronephrosis. There are few smooth marginated fluid density lesions in the left kidney largest measuring 4.5 cm. There are no renal or ureteral stones. Urinary bladder is partly obscured by beam hardening  artifacts limiting evaluation. Stomach/Bowel: Stomach is not distended. Small diverticulum is noted along the inner margin of duodenum. There is mild dilation of distal small bowel loops measuring up to 3.2 cm. There is fluid in the distal small bowel loops. There is no significant distention of colon. Multiple diverticula are seen in colon without signs of diverticulitis. Vascular/Lymphatic: Extrinsic calcifications are seen in aorta and its major branches. No new significant lymphadenopathy is seen. Reproductive: Prostate is enlarged. Other: There is no pneumoperitoneum.  Small pelvic ascites is seen. Musculoskeletal: There is previous right hip arthroplasty. Degenerative changes are noted in lumbar spine and left hip. Slight decrease in the height of upper endplate of body of L2 vertebra has not changed. IMPRESSION: There is dilation of distal small bowel loops with fluid in the lumen. Findings suggest partial small bowel obstruction which may be due to adhesions or internal hernia. Small pelvic ascites is seen. There is no pneumoperitoneum. There is no hydronephrosis. Diverticulosis of colon without signs of focal diverticulitis. Other findings as described in the body of the report. Electronically Signed   By: Elmer Picker M.D.   On: 08/02/2021 11:34    Procedures Procedures    Medications Ordered in ED Medications  morphine 2 MG/ML injection 2 mg (2 mg Intravenous Given 08/02/21 0945)    ED Course  I have reviewed the triage vital signs and the nursing notes.  Pertinent labs & imaging results that were available during my care of the patient were reviewed by me and considered in my medical decision making (see chart for details).    MDM Rules/Calculators/A&P                          MDM:   Elevated Bun and creatine.  Ct scan shows partial small bowel obstruction.  Iv ns x 1 liter ordered,   I spoke to Dr. Rogers Blocker Hospitalist who will admit.  General surgery consulted.  Dr. Thermon Leyland will see and evaluate.  Final Clinical Impression(s) / ED Diagnoses Final diagnoses:  Partial small bowel obstruction [facility])    Rx / DC Orders ED Discharge Orders     None        Sidney Ace 08/02/21 1255    Godfrey Pick, MD 08/02/21 [phone]

## 2021-08-02 NOTE — ED Notes (Signed)
Dr. Rogers Blocker admitting MD at [facility].

## 2021-08-03 ENCOUNTER — Inpatient Hospital Stay (HOSPITAL_COMMUNITY): Payer: Medicare Other

## 2021-08-03 DIAGNOSIS — C180 Malignant neoplasm of cecum: Secondary | ICD-10-CM | POA: Diagnosis present

## 2021-08-03 DIAGNOSIS — I5032 Chronic diastolic (congestive) heart failure: Secondary | ICD-10-CM | POA: Diagnosis present

## 2021-08-03 DIAGNOSIS — E039 Hypothyroidism, unspecified: Secondary | ICD-10-CM | POA: Diagnosis present

## 2021-08-03 DIAGNOSIS — E43 Unspecified severe protein-calorie malnutrition: Secondary | ICD-10-CM | POA: Diagnosis present

## 2021-08-03 DIAGNOSIS — K5669 Other partial intestinal obstruction: Secondary | ICD-10-CM | POA: Diagnosis present

## 2021-08-03 DIAGNOSIS — N1832 Chronic kidney disease, stage 3b: Secondary | ICD-10-CM | POA: Diagnosis present

## 2021-08-03 DIAGNOSIS — Z681 Body mass index (BMI) 19 or less, adult: Secondary | ICD-10-CM | POA: Diagnosis not present

## 2021-08-03 DIAGNOSIS — R188 Other ascites: Secondary | ICD-10-CM | POA: Diagnosis present

## 2021-08-03 DIAGNOSIS — N179 Acute kidney failure, unspecified: Secondary | ICD-10-CM | POA: Diagnosis present

## 2021-08-03 DIAGNOSIS — I130 Hypertensive heart and chronic kidney disease with heart failure and stage 1 through stage 4 chronic kidney disease, or unspecified chronic kidney disease: Secondary | ICD-10-CM | POA: Diagnosis present

## 2021-08-03 DIAGNOSIS — Z20822 Contact with and (suspected) exposure to covid-19: Secondary | ICD-10-CM | POA: Diagnosis present

## 2021-08-03 DIAGNOSIS — N400 Enlarged prostate without lower urinary tract symptoms: Secondary | ICD-10-CM | POA: Diagnosis present

## 2021-08-03 DIAGNOSIS — Z79899 Other long term (current) drug therapy: Secondary | ICD-10-CM | POA: Diagnosis not present

## 2021-08-03 DIAGNOSIS — I10 Essential (primary) hypertension: Secondary | ICD-10-CM | POA: Diagnosis not present

## 2021-08-03 DIAGNOSIS — D649 Anemia, unspecified: Secondary | ICD-10-CM | POA: Diagnosis present

## 2021-08-03 DIAGNOSIS — Z96641 Presence of right artificial hip joint: Secondary | ICD-10-CM | POA: Diagnosis present

## 2021-08-03 DIAGNOSIS — Z87891 Personal history of nicotine dependence: Secondary | ICD-10-CM | POA: Diagnosis not present

## 2021-08-03 DIAGNOSIS — I440 Atrioventricular block, first degree: Secondary | ICD-10-CM | POA: Diagnosis present

## 2021-08-03 DIAGNOSIS — I480 Paroxysmal atrial fibrillation: Secondary | ICD-10-CM | POA: Diagnosis present

## 2021-08-03 DIAGNOSIS — K56609 Unspecified intestinal obstruction, unspecified as to partial versus complete obstruction: Secondary | ICD-10-CM | POA: Diagnosis present

## 2021-08-03 DIAGNOSIS — C772 Secondary and unspecified malignant neoplasm of intra-abdominal lymph nodes: Secondary | ICD-10-CM | POA: Diagnosis present

## 2021-08-03 DIAGNOSIS — N184 Chronic kidney disease, stage 4 (severe): Secondary | ICD-10-CM | POA: Diagnosis not present

## 2021-08-03 DIAGNOSIS — Z66 Do not resuscitate: Secondary | ICD-10-CM | POA: Diagnosis present

## 2021-08-03 LAB — HEMOGLOBIN A1C
Hgb A1c MFr Bld: 6.7 % — ABNORMAL HIGH (ref 4.8–5.6)
Mean Plasma Glucose: 145.59 mg/dL

## 2021-08-03 LAB — BASIC METABOLIC PANEL
Anion gap: 6 (ref 5–15)
BUN: 50 mg/dL — ABNORMAL HIGH (ref 8–23)
CO2: 22 mmol/L (ref 22–32)
Calcium: 8.3 mg/dL — ABNORMAL LOW (ref 8.9–10.3)
Chloride: 114 mmol/L — ABNORMAL HIGH (ref 98–111)
Creatinine, Ser: 1.79 mg/dL — ABNORMAL HIGH (ref 0.61–1.24)
GFR, Estimated: 34 mL/min — ABNORMAL LOW (ref >60)
Glucose, Bld: 101 mg/dL — ABNORMAL HIGH (ref 70–99)
Potassium: 3.4 mmol/L — ABNORMAL LOW (ref 3.5–5.1)
Sodium: 142 mmol/L (ref 135–145)

## 2021-08-03 LAB — CBC
HCT: 33.6 % — ABNORMAL LOW (ref 39.0–52.0)
Hemoglobin: 10.4 g/dL — ABNORMAL LOW (ref 13.0–17.0)
MCH: 28 pg (ref 26.0–34.0)
MCHC: 31 g/dL (ref 30.0–36.0)
MCV: 90.6 fL (ref 80.0–100.0)
Platelets: 162 10*3/uL (ref 150–400)
RBC: 3.71 MIL/uL — ABNORMAL LOW (ref 4.22–5.81)
RDW: 14.3 % (ref 11.5–15.5)
WBC: 3.6 10*3/uL — ABNORMAL LOW (ref 4.0–10.5)
nRBC: 0 % (ref 0.0–0.2)

## 2021-08-03 MED ORDER — DIATRIZOATE MEGLUMINE & SODIUM 66-10 % PO SOLN
90.0000 mL | Freq: Once | ORAL | Status: DC
  Filled 2021-08-03: qty 90

## 2021-08-03 MED ORDER — POTASSIUM CHLORIDE 10 MEQ/100ML IV SOLN
10.0000 meq | INTRAVENOUS | Status: AC
Start: 2021-08-03 — End: 2021-08-03
  Administered 2021-08-03 (×4): 10 meq via INTRAVENOUS
  Filled 2021-08-03 (×4): qty 100

## 2021-08-03 MED ORDER — DIATRIZOATE MEGLUMINE & SODIUM 66-10 % PO SOLN
90.0000 mL | Freq: Once | ORAL | Status: AC
  Administered 2021-08-03: 90 mL via ORAL
  Filled 2021-08-03: qty 90

## 2021-08-03 NOTE — Progress Notes (Signed)
Mobility Specialist: Progress Note   08/03/21 1701  Mobility  Activity Ambulated in hall  Level of Assistance Minimal assist, patient does 75% or more  Assistive Device Front wheel walker  Distance Ambulated (ft) 380 ft  Mobility Ambulated with assistance in hallway  Mobility Response Tolerated well  Mobility performed by Mobility specialist  $Mobility charge 1 Mobility   Pre-Mobility: 79 HR Post-Mobility: 93 HR  Pt independent to sit EOB but required minA to stand. Pt had no c/o during ambulation. Pt back to bed after walk per request with call bell and phone in reach. Family member present in the room.   [facility] [place] Mobility Specialist Mobility Specialist Phone: [phone]

## 2021-08-03 NOTE — Progress Notes (Signed)
PROGRESS NOTE    Nathan Kim  JKD:[id number] DOB: 1919/10/21 DOA: 08/02/2021 PCP: [name], MD   Brief Narrative:  Nathan Kim is a 87 y.o. male with medical history significant of HTN, PAF, diastolic CHF, CKD stage IV, normocytic anemia, hypothyroidism, hx of first degree heart block who presented to ED with complaints of abdominal pain, poor appetite N/V and weight loss. He was discharged home in September and was fine for 2 weeks with normal appetite/diet and no abdominal pain. Then he would have episodes that started out weekly that consist of abdominal pain and not being able to eat. Abdominal pain is in the middle of his belly. Pain described as a 10/10 and sharp/stabbing. No radiation. Pain could last 24 hours. As the weeks progressed he would have more frequent episodes and then started to have nausea and vomiting. Over the past week he has been sick 4 times with nausea, vomiting, stomach pain and has not been able to eat. He has lost over 10 pounds since his last hospital admit.    Never had a colonoscopy. Last BM was early this AM around 2AM and then had a "massive BM" at 3AM. No blood in stool. He is having flatus, but none today. He has not thrown up today. He has had nothing to eat or drink since 9pm last night.    Has only had hernia repair surgery (unsure where or when). They think over 20 years ago.    Was admitted 06/05/21 for ileus. Placed on observation, ivf. No intervention needed, treated conservatively.    He specifically denies any fevers or chills, headaches or vision changes, chest pain or palpitations, shortness of breath or cough, leg swelling, dysuria or other urinary symptoms.     ED Course: vitals: Afebrile, blood pressure 120/57, heart rate 81, respiratory rate 18, oxygen 100% on room air. Pertinent labs: WBC 3.6, hemoglobin 10.9, BUN 61, creatinine 2.13 CT abdomen pelvis: Dilation of distal small bowel loops with fluid in the lumen.  Suggest partial  small bowel obstruction which may be due to adhesions or internal hernia.  Small pelvic ascites. In ED patient made n.p.o., given normal saline bolus and morphine and TRH was asked to admit.  Assessment & Plan:   Principal Problem:   SBO (small bowel obstruction) (HCC) Active Problems:   PAF (paroxysmal atrial fibrillation) (HCC)   Essential hypertension   CKD (chronic kidney disease), stage IV (HCC)   Normocytic anemia   Hypothyroidism   Chronic diastolic CHF (congestive heart failure) (HCC)  SBO (small bowel obstruction) ([place]): Feels better.  No abdominal pain.  No nausea.  Abdomen soft nontender.  Diminished bowel sounds.  General surgery on board and managing and they are going to do small bowel study.  Patient has been started on clears.  Appreciate general surgery help.  PAF: Rates controlled on Lopressor.  Not on anticoagulation, a mutual decision made by the family and patient's cardiologist.  Continue aspirin.    AKI on CKD stage IIIb/dehydration: At baseline now.  Patient does not have CKD stage IV.   Chronic diastolic CHF: Appears euvolemic. Not on any IV fluids anymore. -Last echo 12/20: EF 55 to 60% with normal LV function.  Moderately increased LVH.  Diastolic parameters indeterminate.  Continue to hold Lasix for now.   Essential hypertension: Blood pressure on the low side.  Continue metoprolol.  Hypothyroidism: Continue Synthroid.   BPH: Continue Flomax.  DVT prophylaxis: SCDs Start: 08/02/21 1323   Code Status: DNR  Family  Communication: Daughter present at bedside.  Plan of care discussed with patient in length with the daughter at the bedside.  Status is: Inpatient  Remains inpatient appropriate because: Needs further management.  Estimated body mass index is 19.59 kg/m as calculated from the following:   Height as of 06/10/21: 5\' 11"  (1.803 m).   Weight as of this encounter: 63.7 kg.     Nutritional Assessment: Body mass index is 19.59 kg/m.[place] Kitchen Seen by  dietician.  I agree with the assessment and plan as outlined below: Nutrition Status:   Skin Assessment: I have examined the patient's skin and I agree with the wound assessment as performed by the wound care RN as outlined below:    Consultants:  General surgery  Procedures:  None  Antimicrobials:  Anti-infectives (From admission, onward)    None          Subjective: Patient seen and examined.  Daughter at the bedside.  Patient alert and oriented.  He has no complaints.  No nausea or abdominal pain.  He is not sure if he is passing flatus.  Objective: Vitals:   08/02/21 1949 08/02/21 2318 08/03/21 0317 08/03/21 0635  BP: 110/65 (!) 104/57 95/72   Pulse: 84 61 84   Resp: 17 19 20    Temp: 98.4 F (36.9 C) 97.7 F (36.5 C) 98 F (36.7 C)   TempSrc: Oral Oral Oral   SpO2: 99% 98% 99%   Weight:    63.7 kg    Intake/Output Summary (Last 24 hours) at 08/03/2021 1226 Last data filed at 08/03/2021 0600 Gross per 24 hour  Intake 0 ml  Output --  Net 0 ml   Filed Weights   08/03/21 0635  Weight: 63.7 kg    Examination:  General exam: Appears calm and comfortable  Respiratory system: Clear to auscultation. Respiratory effort normal. Cardiovascular system: S1 & S2 heard, RRR. No JVD, murmurs, rubs, gallops or clicks. No pedal edema. Gastrointestinal system: Abdomen is nondistended, soft and nontender. No organomegaly or masses felt.  Diminished bowel sounds. Central nervous system: Alert and oriented. No focal neurological deficits. Extremities: Symmetric 5 x 5 power. Skin: No rashes, lesions or ulcers Psychiatry: Judgement and insight appear normal. Mood & affect appropriate.    Data Reviewed: I have personally reviewed following labs and imaging studies  CBC: Recent Labs  Lab 08/02/21 0636 08/03/21 0125  WBC 3.6* 3.6*  HGB 10.9* 10.4*  HCT 34.4* 33.6*  MCV 90.5 90.6  PLT 189 810   Basic Metabolic Panel: Recent Labs  Lab 08/02/21 0636  08/03/21 0125  NA 140 142  K 3.7 3.4*  CL 107 114*  CO2 25 22  GLUCOSE 135* 101*  BUN 61* 50*  CREATININE 2.13* 1.79*  CALCIUM 8.8* 8.3*  MG 2.3  --    GFR: Estimated Creatinine Clearance: 20.3 mL/min (A) (by C-G formula based on SCr of 1.79 mg/dL (H)). Liver Function Tests: Recent Labs  Lab 08/02/21 0636  AST 16  ALT 7  ALKPHOS 69  BILITOT 0.7  PROT 5.8*  ALBUMIN 3.3*   Recent Labs  Lab 08/02/21 0636  LIPASE 26   No results for input(s): AMMONIA in the last 168 hours. Coagulation Profile: No results for input(s): INR, PROTIME in the last 168 hours. Cardiac Enzymes: No results for input(s): CKTOTAL, CKMB, CKMBINDEX, TROPONINI in the last 168 hours. BNP (last 3 results) No results for input(s): PROBNP in the last 8760 hours. HbA1C: Recent Labs    08/03/21 0125  HGBA1C  6.7*   CBG: No results for input(s): GLUCAP in the last 168 hours. Lipid Profile: No results for input(s): CHOL, HDL, LDLCALC, TRIG, CHOLHDL, LDLDIRECT in the last 72 hours. Thyroid Function Tests: Recent Labs    08/02/21 1300  TSH 8.632*   Anemia Panel: No results for input(s): VITAMINB12, FOLATE, FERRITIN, TIBC, IRON, RETICCTPCT in the last 72 hours. Sepsis Labs: Recent Labs  Lab 08/02/21 1300 08/02/21 1907  LATICACIDVEN 1.1 0.9    Recent Results (from the past 240 hour(s))  Resp Panel by RT-PCR (Flu A&B, Covid) Nasopharyngeal Swab     Status: None   Collection Time: 08/02/21 12:16 PM   Specimen: Nasopharyngeal Swab; Nasopharyngeal(NP) swabs in vial transport medium  Result Value Ref Range Status   SARS Coronavirus 2 by RT PCR NEGATIVE NEGATIVE Final    Comment: (NOTE) SARS-CoV-2 target nucleic acids are NOT DETECTED.  The SARS-CoV-2 RNA is generally detectable in upper respiratory specimens during the acute phase of infection. The lowest concentration of SARS-CoV-2 viral copies this assay can detect is 138 copies/mL. A negative result does not preclude SARS-Cov-2 infection  and should not be used as the sole basis for treatment or other patient management decisions. A negative result may occur with  improper specimen collection/handling, submission of specimen other than nasopharyngeal swab, presence of viral mutation(s) within the areas targeted by this assay, and inadequate number of viral copies(<138 copies/mL). A negative result must be combined with clinical observations, patient history, and epidemiological information. The expected result is Negative.  Fact Sheet for Patients:  EntrepreneurPulse.com.au  Fact Sheet for Healthcare Providers:  IncredibleEmployment.be  This test is no t yet approved or cleared by the [place] FDA and  has been authorized for detection and/or diagnosis of SARS-CoV-2 by FDA under an Emergency Use Authorization (EUA). This EUA will remain  in effect (meaning this test can be used) for the duration of the COVID-19 declaration under Section 564(b)(1) of the Act, 21 U.S.C.section 360bbb-3(b)(1), unless the authorization is terminated  or revoked sooner.       Influenza A by PCR NEGATIVE NEGATIVE Final   Influenza B by PCR NEGATIVE NEGATIVE Final    Comment: (NOTE) The Xpert Xpress SARS-CoV-2/FLU/RSV plus assay is intended as an aid in the diagnosis of influenza from Nasopharyngeal swab specimens and should not be used as a sole basis for treatment. Nasal washings and aspirates are unacceptable for Xpert Xpress SARS-CoV-2/FLU/RSV testing.  Fact Sheet for Patients: EntrepreneurPulse.com.au  Fact Sheet for Healthcare Providers: IncredibleEmployment.be  This test is not yet approved or cleared by the [place] FDA and has been authorized for detection and/or diagnosis of SARS-CoV-2 by FDA under an Emergency Use Authorization (EUA). This EUA will remain in effect (meaning this test can be used) for the duration of the COVID-19 declaration  under Section 564(b)(1) of the Act, 21 U.S.C. section 360bbb-3(b)(1), unless the authorization is terminated or revoked.  Performed at [facility], [place] [address] [place], [place] [address]       Radiology Studies: CT ABDOMEN PELVIS WO CONTRAST  Result Date: 08/02/2021 CLINICAL DATA:  Abdominal pain EXAM: CT ABDOMEN AND PELVIS WITHOUT CONTRAST TECHNIQUE: Multidetector CT imaging of the abdomen and pelvis was performed following the standard protocol without IV contrast. COMPARISON:  06/05/2021 FINDINGS: Lower chest: Coronary artery calcifications are seen Hepatobiliary: Liver measures 14 cm. No focal abnormality is seen. There is no dilation of bile ducts. Gallbladder is slightly distended. There is no significant wall thickening. Pancreas: There is  atrophy. There are coarse calcifications, more so in the head of the pancreas suggesting chronic pancreatitis. There are no loculated fluid collections in or around the pancreas. Spleen: Unremarkable Adrenals/Urinary Tract: Adrenals are not enlarged. There is no hydronephrosis. There are few smooth marginated fluid density lesions in the left kidney largest measuring 4.5 cm. There are no renal or ureteral stones. Urinary bladder is partly obscured by beam hardening artifacts limiting evaluation. Stomach/Bowel: Stomach is not distended. Small diverticulum is noted along the inner margin of duodenum. There is mild dilation of distal small bowel loops measuring up to 3.2 cm. There is fluid in the distal small bowel loops. There is no significant distention of colon. Multiple diverticula are seen in colon without signs of diverticulitis. Vascular/Lymphatic: Extrinsic calcifications are seen in aorta and its major branches. No new significant lymphadenopathy is seen. Reproductive: Prostate is enlarged. Other: There is no pneumoperitoneum.  Small pelvic ascites is seen. Musculoskeletal: There is previous right hip arthroplasty. Degenerative changes are  noted in lumbar spine and left hip. Slight decrease in the height of upper endplate of body of L2 vertebra has not changed. IMPRESSION: There is dilation of distal small bowel loops with fluid in the lumen. Findings suggest partial small bowel obstruction which may be due to adhesions or internal hernia. Small pelvic ascites is seen. There is no pneumoperitoneum. There is no hydronephrosis. Diverticulosis of colon without signs of focal diverticulitis. Other findings as described in the body of the report. Electronically Signed   By: Elmer Picker M.D.   On: 08/02/2021 11:34    Scheduled Meds:  aspirin EC  81 mg Oral QODAY   levothyroxine  50 mcg Oral QAC breakfast   metoprolol tartrate  12.5 mg Oral BID   tamsulosin  0.4 mg Oral Daily   Continuous Infusions:  potassium chloride 10 mEq (08/03/21 1008)     LOS: 0 days   Time spent: 33 minutes   Darliss Cheney, MD [facility]  08/03/2021, 12:26 PM  Please page via [place] and do not message via secure chat for anything urgent. Secure chat can be used for anything non urgent.  How to contact the [facility] Attending or Consulting provider [place] or covering provider during after hours [place], for this patient?  Check the care team in [facility] and look for a) attending/consulting [facility] provider listed and b) the [facility] team listed. Page or secure chat [phone]. Log into [email or web address] and use [place]'s universal password to access. If you do not have the password, please contact the hospital operator. Locate the [facility] provider you are looking for under [facility] and page to a number that you can be directly reached. If you still have difficulty reaching the provider, please page the [facility] (Director on Call) for the Hospitalists listed on amion for assistance.

## 2021-08-03 NOTE — Progress Notes (Signed)
Patient ID: Nathan Kim, male   DOB: 1920/03/13, 87 y.o.   MRN: [id number] [facility] Surgery Progress Note     Subjective: CC-  Daughter at bedside. Feeling better. No more n/v. He has passed some flatus. No BM. Denies abdominal pain. Worried about going home without any intervention because these episodes are becoming more frequent, and he knows that this will happen again.  Objective: Vital signs in last 24 hours: Temp:  [97.4 F (36.3 C)-98.4 F (36.9 C)] 98 F (36.7 C) (10/31 0317) Pulse Rate:  [61-88] 84 (10/31 0317) Resp:  [11-20] 20 (10/31 0317) BP: (95-113)/(56-72) 95/72 (10/31 0317) SpO2:  [98 %-100 %] 99 % (10/31 0317) Weight:  [63.7 kg] 63.7 kg (10/31 0635)    Intake/Output from previous day: No intake/output data recorded. Intake/Output this shift: No intake/output data recorded.  PE: Gen:  Alert, NAD, pleasant Pulm: rate and effort normal Abd: Soft, NT/ND, +BS  Lab Results:  Recent Labs    08/02/21 0636 08/03/21 0125  WBC 3.6* 3.6*  HGB 10.9* 10.4*  HCT 34.4* 33.6*  PLT 189 162   BMET Recent Labs    08/02/21 0636 08/03/21 0125  NA 140 142  K 3.7 3.4*  CL 107 114*  CO2 25 22  GLUCOSE 135* 101*  BUN 61* 50*  CREATININE 2.13* 1.79*  CALCIUM 8.8* 8.3*   PT/INR No results for input(s): LABPROT, INR in the last 72 hours. CMP     Component Value Date/Time   NA 142 08/03/2021 0125   NA 140 01/20/2017 1145   K 3.4 (L) 08/03/2021 0125   CL 114 (H) 08/03/2021 0125   CO2 22 08/03/2021 0125   GLUCOSE 101 (H) 08/03/2021 0125   BUN 50 (H) 08/03/2021 0125   BUN 36 01/20/2017 1145   CREATININE 1.79 (H) 08/03/2021 0125   CREATININE 1.78 (H) 12/16/2016 1110   CALCIUM 8.3 (L) 08/03/2021 0125   PROT 5.8 (L) 08/02/2021 0636   ALBUMIN 3.3 (L) 08/02/2021 0636   AST 16 08/02/2021 0636   ALT 7 08/02/2021 0636   ALKPHOS 69 08/02/2021 0636   BILITOT 0.7 08/02/2021 0636   GFRNONAA 34 (L) 08/03/2021 0125   GFRAA 27 (L) 10/22/2019 1116    Lipase     Component Value Date/Time   LIPASE 26 08/02/2021 0636       Studies/Results: CT ABDOMEN PELVIS WO CONTRAST  Result Date: 08/02/2021 CLINICAL DATA:  Abdominal pain EXAM: CT ABDOMEN AND PELVIS WITHOUT CONTRAST TECHNIQUE: Multidetector CT imaging of the abdomen and pelvis was performed following the standard protocol without IV contrast. COMPARISON:  06/05/2021 FINDINGS: Lower chest: Coronary artery calcifications are seen Hepatobiliary: Liver measures 14 cm. No focal abnormality is seen. There is no dilation of bile ducts. Gallbladder is slightly distended. There is no significant wall thickening. Pancreas: There is atrophy. There are coarse calcifications, more so in the head of the pancreas suggesting chronic pancreatitis. There are no loculated fluid collections in or around the pancreas. Spleen: Unremarkable Adrenals/Urinary Tract: Adrenals are not enlarged. There is no hydronephrosis. There are few smooth marginated fluid density lesions in the left kidney largest measuring 4.5 cm. There are no renal or ureteral stones. Urinary bladder is partly obscured by beam hardening artifacts limiting evaluation. Stomach/Bowel: Stomach is not distended. Small diverticulum is noted along the inner margin of duodenum. There is mild dilation of distal small bowel loops measuring up to 3.2 cm. There is fluid in the distal small bowel loops. There is no significant distention of  colon. Multiple diverticula are seen in colon without signs of diverticulitis. Vascular/Lymphatic: Extrinsic calcifications are seen in aorta and its major branches. No new significant lymphadenopathy is seen. Reproductive: Prostate is enlarged. Other: There is no pneumoperitoneum.  Small pelvic ascites is seen. Musculoskeletal: There is previous right hip arthroplasty. Degenerative changes are noted in lumbar spine and left hip. Slight decrease in the height of upper endplate of body of L2 vertebra has not changed.  IMPRESSION: There is dilation of distal small bowel loops with fluid in the lumen. Findings suggest partial small bowel obstruction which may be due to adhesions or internal hernia. Small pelvic ascites is seen. There is no pneumoperitoneum. There is no hydronephrosis. Diverticulosis of colon without signs of focal diverticulitis. Other findings as described in the body of the report. Electronically Signed   By: Elmer Picker M.D.   On: 08/02/2021 11:34    Anti-infectives: Anti-infectives (From admission, onward)    None        Assessment/Plan SBO - CT suggests partial small bowel obstruction which may be due to adhesions or internal hernia - symptoms have resolved today, but he is concerned about recurrence because these episodes are happening more and more frequently - we discussed that surgery could certainly make him worse, and now with resolved symptoms we may not even find point of obstruction in the OR - will give oral gastrograffin and obtain delayed abdominal film today. Ok for clear liquids  ID - none FEN - CLD VTE - SCDs, ok for chemical dvt prophylaxis from surgical standpoint Foley - none  HTN PAF not on anticoagulation CHF CKD-IV Hypothyroidism First degree heart block BPH Code status DNR   LOS: 0 days    Wellington Hampshire, [facility] Surgery 08/03/2021, 10:01 AM Please see Amion for pager number during day hours 7:00am-4:30pm

## 2021-08-04 DIAGNOSIS — K56609 Unspecified intestinal obstruction, unspecified as to partial versus complete obstruction: Secondary | ICD-10-CM | POA: Diagnosis not present

## 2021-08-04 LAB — BASIC METABOLIC PANEL
Anion gap: 7 (ref 5–15)
BUN: 45 mg/dL — ABNORMAL HIGH (ref 8–23)
CO2: 20 mmol/L — ABNORMAL LOW (ref 22–32)
Calcium: 8.2 mg/dL — ABNORMAL LOW (ref 8.9–10.3)
Chloride: 117 mmol/L — ABNORMAL HIGH (ref 98–111)
Creatinine, Ser: 1.73 mg/dL — ABNORMAL HIGH (ref 0.61–1.24)
GFR, Estimated: 35 mL/min — ABNORMAL LOW (ref >60)
Glucose, Bld: 75 mg/dL (ref 70–99)
Potassium: 3.8 mmol/L (ref 3.5–5.1)
Sodium: 144 mmol/L (ref 135–145)

## 2021-08-04 LAB — GLUCOSE, CAPILLARY
Glucose-Capillary: 118 mg/dL — ABNORMAL HIGH (ref 70–99)
Glucose-Capillary: 177 mg/dL — ABNORMAL HIGH (ref 70–99)
Glucose-Capillary: 188 mg/dL — ABNORMAL HIGH (ref 70–99)

## 2021-08-04 MED ORDER — ADULT MULTIVITAMIN W/MINERALS CH
1.0000 | ORAL_TABLET | Freq: Every day | ORAL | Status: DC
  Administered 2021-08-04 – 2021-08-12 (×8): 1 via ORAL
  Filled 2021-08-04 (×8): qty 1

## 2021-08-04 MED ORDER — SODIUM CHLORIDE 0.9 % IV SOLN
2.0000 g | INTRAVENOUS | Status: DC
  Filled 2021-08-04: qty 2

## 2021-08-04 MED ORDER — CHLORHEXIDINE GLUCONATE CLOTH 2 % EX PADS
6.0000 | MEDICATED_PAD | Freq: Once | CUTANEOUS | Status: AC
  Administered 2021-08-04: 6 via TOPICAL

## 2021-08-04 MED ORDER — CHLORHEXIDINE GLUCONATE CLOTH 2 % EX PADS
6.0000 | MEDICATED_PAD | Freq: Once | CUTANEOUS | Status: AC
  Administered 2021-08-05: 6 via TOPICAL

## 2021-08-04 MED ORDER — PROSOURCE PLUS PO LIQD
30.0000 mL | Freq: Three times a day (TID) | ORAL | Status: DC
  Administered 2021-08-04: 30 mL via ORAL
  Filled 2021-08-04: qty 30

## 2021-08-04 MED ORDER — BOOST / RESOURCE BREEZE PO LIQD CUSTOM
1.0000 | Freq: Three times a day (TID) | ORAL | Status: DC
  Administered 2021-08-04 (×2): 1 via ORAL

## 2021-08-04 MED ORDER — INSULIN ASPART 100 UNIT/ML IJ SOLN
0.0000 [IU] | Freq: Three times a day (TID) | INTRAMUSCULAR | Status: DC
  Administered 2021-08-04 – 2021-08-11 (×4): 1 [IU] via SUBCUTANEOUS

## 2021-08-04 MED ORDER — INSULIN ASPART 100 UNIT/ML IJ SOLN: 0.0000 [IU] | Freq: Every day | INTRAMUSCULAR | Status: DC

## 2021-08-04 NOTE — H&P (View-Only) (Signed)
Patient ID: Nathan Kim, male   DOB: Apr 27, 1920, 87 y.o.   MRN: [id number] [facility] Surgery Progress Note     Subjective: CC-  Daughter at bedside. Starving this morning. States that he was not given any clear liquids yesterday. Denies abdominal pain, nausea, vomiting. He had 4 BMs yesterday and has passed some flatus. Continues to worry that this is going to happen again at home. Would like to have surgery and understands the risks.  Objective: Vital signs in last 24 hours: Temp:  [97.5 F (36.4 C)-98.4 F (36.9 C)] 98.1 F (36.7 C) (11/01 0829) Pulse Rate:  [61-79] 61 (11/01 0829) Resp:  [16-20] 17 (11/01 0829) BP: (98-137)/(57-76) 118/65 (11/01 0900) SpO2:  [97 %-100 %] 97 % (11/01 0829) Weight:  [62.4 kg] 62.4 kg (11/01 0429) Last BM Date: 08/03/21  Intake/Output from previous day: 10/31 0701 - 11/01 0700 In: 436.1 [IV Piggyback:436.1] Out: -  Intake/Output this shift: No intake/output data recorded.  PE: Gen:  Alert, NAD, pleasant Cardio: RRR Pulm: rate and effort normal on room air Abd: Soft, NT/ND, +BS Psych: A&Ox3  Lab Results:  Recent Labs    08/02/21 0636 08/03/21 0125  WBC 3.6* 3.6*  HGB 10.9* 10.4*  HCT 34.4* 33.6*  PLT 189 162   BMET Recent Labs    08/03/21 0125 08/04/21 0100  NA 142 144  K 3.4* 3.8  CL 114* 117*  CO2 22 20*  GLUCOSE 101* 75  BUN 50* 45*  CREATININE 1.79* 1.73*  CALCIUM 8.3* 8.2*   PT/INR No results for input(s): LABPROT, INR in the last 72 hours. CMP     Component Value Date/Time   NA 144 08/04/2021 0100   NA 140 01/20/2017 1145   K 3.8 08/04/2021 0100   CL 117 (H) 08/04/2021 0100   CO2 20 (L) 08/04/2021 0100   GLUCOSE 75 08/04/2021 0100   BUN 45 (H) 08/04/2021 0100   BUN 36 01/20/2017 1145   CREATININE 1.73 (H) 08/04/2021 0100   CREATININE 1.78 (H) 12/16/2016 1110   CALCIUM 8.2 (L) 08/04/2021 0100   PROT 5.8 (L) 08/02/2021 0636   ALBUMIN 3.3 (L) 08/02/2021 0636   AST 16 08/02/2021 0636   ALT 7  08/02/2021 0636   ALKPHOS 69 08/02/2021 0636   BILITOT 0.7 08/02/2021 0636   GFRNONAA 35 (L) 08/04/2021 0100   GFRAA 27 (L) 10/22/2019 1116   Lipase     Component Value Date/Time   LIPASE 26 08/02/2021 0636       Studies/Results: CT ABDOMEN PELVIS WO CONTRAST  Result Date: 08/02/2021 CLINICAL DATA:  Abdominal pain EXAM: CT ABDOMEN AND PELVIS WITHOUT CONTRAST TECHNIQUE: Multidetector CT imaging of the abdomen and pelvis was performed following the standard protocol without IV contrast. COMPARISON:  06/05/2021 FINDINGS: Lower chest: Coronary artery calcifications are seen Hepatobiliary: Liver measures 14 cm. No focal abnormality is seen. There is no dilation of bile ducts. Gallbladder is slightly distended. There is no significant wall thickening. Pancreas: There is atrophy. There are coarse calcifications, more so in the head of the pancreas suggesting chronic pancreatitis. There are no loculated fluid collections in or around the pancreas. Spleen: Unremarkable Adrenals/Urinary Tract: Adrenals are not enlarged. There is no hydronephrosis. There are few smooth marginated fluid density lesions in the left kidney largest measuring 4.5 cm. There are no renal or ureteral stones. Urinary bladder is partly obscured by beam hardening artifacts limiting evaluation. Stomach/Bowel: Stomach is not distended. Small diverticulum is noted along the inner margin of duodenum. There  is mild dilation of distal small bowel loops measuring up to 3.2 cm. There is fluid in the distal small bowel loops. There is no significant distention of colon. Multiple diverticula are seen in colon without signs of diverticulitis. Vascular/Lymphatic: Extrinsic calcifications are seen in aorta and its major branches. No new significant lymphadenopathy is seen. Reproductive: Prostate is enlarged. Other: There is no pneumoperitoneum.  Small pelvic ascites is seen. Musculoskeletal: There is previous right hip arthroplasty. Degenerative  changes are noted in lumbar spine and left hip. Slight decrease in the height of upper endplate of body of L2 vertebra has not changed. IMPRESSION: There is dilation of distal small bowel loops with fluid in the lumen. Findings suggest partial small bowel obstruction which may be due to adhesions or internal hernia. Small pelvic ascites is seen. There is no pneumoperitoneum. There is no hydronephrosis. Diverticulosis of colon without signs of focal diverticulitis. Other findings as described in the body of the report. Electronically Signed   By: Elmer Picker M.D.   On: 08/02/2021 11:34   DG Abd Portable 1V-Small Bowel Obstruction Protocol-initial, 8 hr delay  Result Date: 08/03/2021 CLINICAL DATA:  Small bowel obstruction.  8 hour delay. EXAM: PORTABLE ABDOMEN - 1 VIEW COMPARISON:  Abdominopelvic CT yesterday. FINDINGS: Administered enteric contrast is seen within the hepatic flexure, descending, and sigmoid colon. There is mild persisting gaseous small bowel distention in the central abdomen. Right hip arthroplasty IMPRESSION: Administered enteric contrast within the colon. Mild persisting gaseous small bowel distention in the central abdomen. Electronically Signed   By: Keith Rake M.D.   On: 08/03/2021 21:04    Anti-infectives: Anti-infectives (From admission, onward)    Start     Dose/Rate Route Frequency Ordered Stop   08/04/21 1100  cefoTEtan (CEFOTAN) 2 g in sodium chloride 0.9 % 100 mL IVPB        2 g 200 mL/hr over 30 Minutes Intravenous On call to O.R. 08/04/21 1001 08/05/21 0559        Assessment/Plan SBO - CT suggests partial small bowel obstruction which may be due to adhesions or internal hernia - Symptoms resolved and patient having bowel function, but he remains concerned about recurrence because these episodes are happening more and more frequently. He understands that he is high risk with operative intervention but would like to proceed with a surgery. We will plan  for diagnostic laparoscopy, possible laparotomy, possible bowel resection tomorrow. [place] for clear liquids today, NPO after midnight.   ID - none FEN - CLD, NPO after MN VTE - SCDs, ok for chemical dvt prophylaxis from surgical standpoint Foley - none   HTN PAF not on anticoagulation CHF CKD-IV Hypothyroidism First degree heart block BPH Code status DNR   LOS: 1 day    Wellington Hampshire, [facility] Surgery 08/04/2021, 10:01 AM Please see Amion for pager number during day hours 7:00am-4:30pm

## 2021-08-04 NOTE — Progress Notes (Signed)
PROGRESS NOTE    Nathan Kim  OIZ:[id number] DOB: 1920-01-28 DOA: 08/02/2021 PCP: [name], MD   Brief Narrative:  Nathan Kim is a 87 y.o. male with medical history significant of HTN, PAF, diastolic CHF, CKD stage IV, normocytic anemia, hypothyroidism, hx of first degree heart block who presented to ED with complaints of abdominal pain, poor appetite N/V and weight loss. He was discharged home in September and was fine for 2 weeks with normal appetite/diet and no abdominal pain. Then he would have episodes that started out weekly that consist of abdominal pain and not being able to eat. Abdominal pain is in the middle of his belly. Pain described as a 10/10 and sharp/stabbing. No radiation. Pain could last 24 hours. As the weeks progressed he would have more frequent episodes and then started to have nausea and vomiting. Has only had hernia repair surgery many years ago.  He was yet again admitted with partial small bowel obstruction.  General surgery consulted.  He was hemodynamically stable upon admission.  Initial plan was to manage conservatively but patient has mention to the surgery that his quality of life is poor so they have scheduled him for laparoscopy tomorrow, possibly laparotomy.   Assessment & Plan:   Principal Problem:   SBO (small bowel obstruction) (HCC) Active Problems:   PAF (paroxysmal atrial fibrillation) (HCC)   Essential hypertension   CKD (chronic kidney disease), stage IV (HCC)   Normocytic anemia   Hypothyroidism   Chronic diastolic CHF (congestive heart failure) (HCC)  Partial SBO (small bowel obstruction) ([place]): Feels better.  No abdominal pain.  No nausea.  Abdomen soft nontender.  Diminished bowel sounds.  He is tolerating liquid as well.  General surgery on board and managing, they have scheduled him for laparoscopy tomorrow after discussing with him and his daughter at the bedside.  PAF: Rates controlled on Lopressor.  Not on anticoagulation,  a mutual decision made by the family and patient's cardiologist.  Continue aspirin.    AKI on CKD stage IIIb/dehydration: At baseline now.  Patient does not have CKD stage IV.   Chronic diastolic CHF: Appears euvolemic. Not on any IV fluids anymore. -Last echo 12/20: EF 55 to 60% with normal LV function.  Moderately increased LVH.  Diastolic parameters indeterminate.  Continue to hold Lasix for now.   Essential hypertension: Blood pressure normal.  Continue metoprolol.  Hypothyroidism: Continue Synthroid.   BPH: Continue Flomax.  DVT prophylaxis: SCD's Start: 08/04/21 1002 SCDs Start: 08/02/21 1323   Code Status: DNR  Family Communication: Daughter present at bedside.  Plan of care discussed with patient in length with the daughter at the bedside.  Status is: Inpatient  Remains inpatient appropriate because: Needs further management.  Estimated body mass index is 19.19 kg/m as calculated from the following:   Height as of 06/10/21: 5\' 11"  (1.803 m).   Weight as of this encounter: 62.4 kg.     Nutritional Assessment: Body mass index is 19.19 kg/m.Marland Kitchen Seen by dietician.  I agree with the assessment and plan as outlined below: Nutrition Status:   Skin Assessment: I have examined the patient's skin and I agree with the wound assessment as performed by the wound care RN as outlined below:    Consultants:  General surgery  Procedures:  None  Antimicrobials:  Anti-infectives (From admission, onward)    Start     Dose/Rate Route Frequency Ordered Stop   08/04/21 1100  cefoTEtan (CEFOTAN) 2 g in sodium chloride 0.9 % 100  mL IVPB        2 g 200 mL/hr over 30 Minutes Intravenous On call to O.R. 08/04/21 1001 08/05/21 0559          Subjective:  Patient seen and examined.  He is fully alert and oriented and drinking liquid now.  No complaints.  Further discussion as mentioned above.  Objective: Vitals:   08/04/21 0006 08/04/21 0429 08/04/21 0829 08/04/21 0900  BP:  115/63 98/67 (!) 112/57 118/65  Pulse: 66 74 61   Resp: 20 18 17    Temp: 98.4 F (36.9 C) 98 F (36.7 C) 98.1 F (36.7 C)   TempSrc: Oral Oral Oral   SpO2: 99% 98% 97%   Weight:  62.4 kg      Intake/Output Summary (Last 24 hours) at 08/04/2021 1143 Last data filed at 08/03/2021 1800 Gross per 24 hour  Intake 436.08 ml  Output --  Net 436.08 ml    Filed Weights   08/03/21 0635 08/04/21 0429  Weight: 63.7 kg 62.4 kg    Examination:  General exam: Appears calm and comfortable  Respiratory system: Clear to auscultation. Respiratory effort normal. Cardiovascular system: S1 & S2 heard, RRR. No JVD, murmurs, rubs, gallops or clicks. No pedal edema. Gastrointestinal system: Abdomen is nondistended, soft and nontender. No organomegaly or masses felt.  Slightly diminished bowel sounds. Central nervous system: Alert and oriented. No focal neurological deficits. Extremities: Symmetric 5 x 5 power. Skin: No rashes, lesions or ulcers.  Psychiatry: Judgement and insight appear normal. Mood & affect appropriate.   Data Reviewed: I have personally reviewed following labs and imaging studies  CBC: Recent Labs  Lab 08/02/21 0636 08/03/21 0125  WBC 3.6* 3.6*  HGB 10.9* 10.4*  HCT 34.4* 33.6*  MCV 90.5 90.6  PLT 189 275    Basic Metabolic Panel: Recent Labs  Lab 08/02/21 0636 08/03/21 0125 08/04/21 0100  NA 140 142 144  K 3.7 3.4* 3.8  CL 107 114* 117*  CO2 25 22 20*  GLUCOSE 135* 101* 75  BUN 61* 50* 45*  CREATININE 2.13* 1.79* 1.73*  CALCIUM 8.8* 8.3* 8.2*  MG 2.3  --   --     GFR: Estimated Creatinine Clearance: 20.5 mL/min (A) (by C-G formula based on SCr of 1.73 mg/dL (H)). Liver Function Tests: Recent Labs  Lab 08/02/21 0636  AST 16  ALT 7  ALKPHOS 69  BILITOT 0.7  PROT 5.8*  ALBUMIN 3.3*    Recent Labs  Lab 08/02/21 0636  LIPASE 26    No results for input(s): AMMONIA in the last 168 hours. Coagulation Profile: No results for input(s): INR,  PROTIME in the last 168 hours. Cardiac Enzymes: No results for input(s): CKTOTAL, CKMB, CKMBINDEX, TROPONINI in the last 168 hours. BNP (last 3 results) No results for input(s): PROBNP in the last 8760 hours. HbA1C: Recent Labs    08/03/21 0125  HGBA1C 6.7*    CBG: No results for input(s): GLUCAP in the last 168 hours. Lipid Profile: No results for input(s): CHOL, HDL, LDLCALC, TRIG, CHOLHDL, LDLDIRECT in the last 72 hours. Thyroid Function Tests: Recent Labs    08/02/21 1300  TSH 8.632*    Anemia Panel: No results for input(s): VITAMINB12, FOLATE, FERRITIN, TIBC, IRON, RETICCTPCT in the last 72 hours. Sepsis Labs: Recent Labs  Lab 08/02/21 1300 08/02/21 1907  LATICACIDVEN 1.1 0.9     Recent Results (from the past 240 hour(s))  Resp Panel by RT-PCR (Flu A&B, Covid) Nasopharyngeal Swab  Status: None   Collection Time: 08/02/21 12:16 PM   Specimen: Nasopharyngeal Swab; Nasopharyngeal(NP) swabs in vial transport medium  Result Value Ref Range Status   SARS Coronavirus 2 by RT PCR NEGATIVE NEGATIVE Final    Comment: (NOTE) SARS-CoV-2 target nucleic acids are NOT DETECTED.  The SARS-CoV-2 RNA is generally detectable in upper respiratory specimens during the acute phase of infection. The lowest concentration of SARS-CoV-2 viral copies this assay can detect is 138 copies/mL. A negative result does not preclude SARS-Cov-2 infection and should not be used as the sole basis for treatment or other patient management decisions. A negative result may occur with  improper specimen collection/handling, submission of specimen other than nasopharyngeal swab, presence of viral mutation(s) within the areas targeted by this assay, and inadequate number of viral copies(<138 copies/mL). A negative result must be combined with clinical observations, patient history, and epidemiological information. The expected result is Negative.  Fact Sheet for Patients:   EntrepreneurPulse.com.au  Fact Sheet for Healthcare Providers:  IncredibleEmployment.be  This test is no t yet approved or cleared by the [place] FDA and  has been authorized for detection and/or diagnosis of SARS-CoV-2 by FDA under an Emergency Use Authorization (EUA). This EUA will remain  in effect (meaning this test can be used) for the duration of the COVID-19 declaration under Section 564(b)(1) of the Act, 21 U.S.C.section 360bbb-3(b)(1), unless the authorization is terminated  or revoked sooner.       Influenza A by PCR NEGATIVE NEGATIVE Final   Influenza B by PCR NEGATIVE NEGATIVE Final    Comment: (NOTE) The Xpert Xpress SARS-CoV-2/FLU/RSV plus assay is intended as an aid in the diagnosis of influenza from Nasopharyngeal swab specimens and should not be used as a sole basis for treatment. Nasal washings and aspirates are unacceptable for Xpert Xpress SARS-CoV-2/FLU/RSV testing.  Fact Sheet for Patients: EntrepreneurPulse.com.au  Fact Sheet for Healthcare Providers: IncredibleEmployment.be  This test is not yet approved or cleared by the [place] FDA and has been authorized for detection and/or diagnosis of SARS-CoV-2 by FDA under an Emergency Use Authorization (EUA). This EUA will remain in effect (meaning this test can be used) for the duration of the COVID-19 declaration under Section 564(b)(1) of the Act, 21 U.S.C. section 360bbb-3(b)(1), unless the authorization is terminated or revoked.  Performed at [facility], [place] [address], [place], [place] [address]        Radiology Studies: DG Abd Portable 1V-Small Bowel Obstruction Protocol-initial, 8 hr delay  Result Date: 08/03/2021 CLINICAL DATA:  Small bowel obstruction.  8 hour delay. EXAM: PORTABLE ABDOMEN - 1 VIEW COMPARISON:  Abdominopelvic CT yesterday. FINDINGS: Administered enteric contrast is seen within the  hepatic flexure, descending, and sigmoid colon. There is mild persisting gaseous small bowel distention in the central abdomen. Right hip arthroplasty IMPRESSION: Administered enteric contrast within the colon. Mild persisting gaseous small bowel distention in the central abdomen. Electronically Signed   By: Keith Rake M.D.   On: 08/03/2021 21:04    Scheduled Meds:  aspirin EC  81 mg Oral QODAY   Chlorhexidine Gluconate Cloth  6 each Topical Once   And   Chlorhexidine Gluconate Cloth  6 each Topical Once   feeding supplement  1 Container Oral TID BM   insulin aspart  0-5 Units Subcutaneous QHS   insulin aspart  0-6 Units Subcutaneous TID WC   levothyroxine  50 mcg Oral QAC breakfast   metoprolol tartrate  12.5 mg Oral BID  tamsulosin  0.4 mg Oral Daily   Continuous Infusions:  cefoTEtan (CEFOTAN) IV       LOS: 1 day   Time spent: 29 minutes   Darliss Cheney, MD [facility]  08/04/2021, 11:43 AM  Please page via [place] [place] and do not message via secure chat for anything urgent. Secure chat can be used for anything non urgent.  How to contact the [facility] Attending or Consulting provider [place] or covering provider during after hours [place], for this patient?  Check the care team in [facility] and look for a) attending/consulting [facility] provider listed and b) the [facility] team listed. Page or secure chat [phone]. Log into [email or web address] and use [place]'s universal password to access. If you do not have the password, please contact the hospital operator. Locate the [facility] provider you are looking for under [facility] and page to a number that you can be directly reached. If you still have difficulty reaching the provider, please page the [facility] (Director on Call) for the Hospitalists listed on amion for assistance.

## 2021-08-04 NOTE — Progress Notes (Signed)
Mobility Specialist Progress Note:   08/04/21 1130  Mobility  Activity Ambulated in hall  Level of Assistance Contact guard assist, steadying assist  Assistive Device Front wheel walker  Distance Ambulated (ft) 215 ft  Mobility Ambulated with assistance in hallway  Mobility Response Tolerated well  Mobility performed by Mobility specialist  $Mobility charge 1 Mobility   Pt asx during ambulation. C/o feeling great this am.   Nelta Numbers Mobility Specialist  Phone [phone]

## 2021-08-04 NOTE — Progress Notes (Signed)
Initial Nutrition Assessment  DOCUMENTATION CODES:   Severe malnutrition in context of chronic illness  INTERVENTION:   Multivitamin w/ minerals daily  Continue Boost Breeze po TID, each supplement provides 250 kcal and 9 grams of protein  30 ml ProSource Plus TID, each supplement provides 100 kcals and 15 grams protein.   Advance diet as medically appropriate per MD Encourage good PO intake  NUTRITION DIAGNOSIS:   Severe Malnutrition related to chronic illness (CHF, CKD IV) as evidenced by severe muscle depletion, severe fat depletion.  GOAL:   Patient will meet greater than or equal to 90% of their needs  MONITOR:   PO intake, Diet advancement, Supplement acceptance, Weight trends, Labs  REASON FOR ASSESSMENT:   Consult Assessment of nutrition requirement/status, Diet education  ASSESSMENT:   87 y.o. male presented to the ED with abdominal pain, poor appetite, N/V, and weight loss. Pt recently discharged from [facility] in September with similar symptoms and found to have an ileus. PMH includes CKD IV, CHF, anemia, and HTN. Pt admitted with small bowel obstruction.   10/30 - NPO 10/31 - Clear Liquid Diet  Pt resting in bed and daughter at bedside at time of visit. Pt lives at home with wife.  Per daughter, pt has been dealing with this since August. It will go in cycles, start NPO -> CLD -> SOFT -> regular and then the symptoms will come back. Started that he would go week and a half without andy, to a week, and now much more frequent. Per daughter, pt was drinking maybe 1 Ensure at home per day.   Per daughter, pt UBW is 146# and now weighs 136#. Per EMR, pt has had a 4% weight loss in ~2 months.   Discussed ONS with pt and daughter, pt already has [employer] ordered. Discussed adding ProSource for additional protein, pt agreeable.   Surgery recommend not going forward with surgery due to risk post-op; pt requesting to proceed. Planned OR for laparoscopy, possible  laparotomy, possible bowel resection tomorrow, 11/2.   Medications reviewed and include: SSI 0-5 units daily + 0-6 units TID, Levothyroxine, IV antibiotic Labs reviewed: BUN 45, Creatinine 1.73, Hgb A1c 6.7%  NUTRITION - FOCUSED PHYSICAL EXAM:  Flowsheet Row Most Recent Value  Orbital Region Severe depletion  Upper Arm Region Severe depletion  Thoracic and Lumbar Region Severe depletion  Buccal Region Severe depletion  Temple Region Severe depletion  Clavicle Bone Region Severe depletion  Clavicle and Acromion Bone Region Severe depletion  Scapular Bone Region Severe depletion  Dorsal Hand Severe depletion  Patellar Region Severe depletion  Anterior Thigh Region Severe depletion  Posterior Calf Region Severe depletion  Edema (RD Assessment) None  Hair Reviewed  Eyes Reviewed  Mouth Reviewed  Skin Reviewed  Nails Reviewed       Diet Order:   Diet Order             Diet NPO time specified  Diet effective midnight           Diet clear liquid Room service appropriate? Yes; Fluid consistency: Thin  Diet effective now                   EDUCATION NEEDS:   No education needs have been identified at this time  Skin:  Skin Assessment: Reviewed RN Assessment  Last BM:  08/03/2021  Height:   Ht Readings from Last 1 Encounters:  06/10/21 5\' 11"  (1.803 m)    Weight:   Wt Readings  from Last 1 Encounters:  08/04/21 62.4 kg    Ideal Body Weight:  78.2 kg  BMI:  Body mass index is 19.19 kg/m.  Estimated Nutritional Needs:   Kcal:  1900-2100  Protein:  95-110 grams  Fluid:  >/= 1.9 L    Rasa Degrazia BS, PLDN Clinical Dietitian See AMiON for contact information.

## 2021-08-04 NOTE — Progress Notes (Signed)
Patient ID: Nathan Kim, male   DOB: Dec 05, 1919, 87 y.o.   MRN: [id number] [facility] Surgery Progress Note     Subjective: CC-  Daughter at bedside. Starving this morning. States that he was not given any clear liquids yesterday. Denies abdominal pain, nausea, vomiting. He had 4 BMs yesterday and has passed some flatus. Continues to worry that this is going to happen again at home. Would like to have surgery and understands the risks.  Objective: Vital signs in last 24 hours: Temp:  [97.5 F (36.4 C)-98.4 F (36.9 C)] 98.1 F (36.7 C) (11/01 0829) Pulse Rate:  [61-79] 61 (11/01 0829) Resp:  [16-20] 17 (11/01 0829) BP: (98-137)/(57-76) 118/65 (11/01 0900) SpO2:  [97 %-100 %] 97 % (11/01 0829) Weight:  [62.4 kg] 62.4 kg (11/01 0429) Last BM Date: 08/03/21  Intake/Output from previous day: 10/31 0701 - 11/01 0700 In: 436.1 [IV Piggyback:436.1] Out: -  Intake/Output this shift: No intake/output data recorded.  PE: Gen:  Alert, NAD, pleasant Cardio: RRR Pulm: rate and effort normal on room air Abd: Soft, NT/ND, +BS Psych: A&Ox3  Lab Results:  Recent Labs    08/02/21 0636 08/03/21 0125  WBC 3.6* 3.6*  HGB 10.9* 10.4*  HCT 34.4* 33.6*  PLT 189 162   BMET Recent Labs    08/03/21 0125 08/04/21 0100  NA 142 144  K 3.4* 3.8  CL 114* 117*  CO2 22 20*  GLUCOSE 101* 75  BUN 50* 45*  CREATININE 1.79* 1.73*  CALCIUM 8.3* 8.2*   PT/INR No results for input(s): LABPROT, INR in the last 72 hours. CMP     Component Value Date/Time   NA 144 08/04/2021 0100   NA 140 01/20/2017 1145   K 3.8 08/04/2021 0100   CL 117 (H) 08/04/2021 0100   CO2 20 (L) 08/04/2021 0100   GLUCOSE 75 08/04/2021 0100   BUN 45 (H) 08/04/2021 0100   BUN 36 01/20/2017 1145   CREATININE 1.73 (H) 08/04/2021 0100   CREATININE 1.78 (H) 12/16/2016 1110   CALCIUM 8.2 (L) 08/04/2021 0100   PROT 5.8 (L) 08/02/2021 0636   ALBUMIN 3.3 (L) 08/02/2021 0636   AST 16 08/02/2021 0636   ALT 7  08/02/2021 0636   ALKPHOS 69 08/02/2021 0636   BILITOT 0.7 08/02/2021 0636   GFRNONAA 35 (L) 08/04/2021 0100   GFRAA 27 (L) 10/22/2019 1116   Lipase     Component Value Date/Time   LIPASE 26 08/02/2021 0636       Studies/Results: CT ABDOMEN PELVIS WO CONTRAST  Result Date: 08/02/2021 CLINICAL DATA:  Abdominal pain EXAM: CT ABDOMEN AND PELVIS WITHOUT CONTRAST TECHNIQUE: Multidetector CT imaging of the abdomen and pelvis was performed following the standard protocol without IV contrast. COMPARISON:  06/05/2021 FINDINGS: Lower chest: Coronary artery calcifications are seen Hepatobiliary: Liver measures 14 cm. No focal abnormality is seen. There is no dilation of bile ducts. Gallbladder is slightly distended. There is no significant wall thickening. Pancreas: There is atrophy. There are coarse calcifications, more so in the head of the pancreas suggesting chronic pancreatitis. There are no loculated fluid collections in or around the pancreas. Spleen: Unremarkable Adrenals/Urinary Tract: Adrenals are not enlarged. There is no hydronephrosis. There are few smooth marginated fluid density lesions in the left kidney largest measuring 4.5 cm. There are no renal or ureteral stones. Urinary bladder is partly obscured by beam hardening artifacts limiting evaluation. Stomach/Bowel: Stomach is not distended. Small diverticulum is noted along the inner margin of duodenum. There  is mild dilation of distal small bowel loops measuring up to 3.2 cm. There is fluid in the distal small bowel loops. There is no significant distention of colon. Multiple diverticula are seen in colon without signs of diverticulitis. Vascular/Lymphatic: Extrinsic calcifications are seen in aorta and its major branches. No new significant lymphadenopathy is seen. Reproductive: Prostate is enlarged. Other: There is no pneumoperitoneum.  Small pelvic ascites is seen. Musculoskeletal: There is previous right hip arthroplasty. Degenerative  changes are noted in lumbar spine and left hip. Slight decrease in the height of upper endplate of body of L2 vertebra has not changed. IMPRESSION: There is dilation of distal small bowel loops with fluid in the lumen. Findings suggest partial small bowel obstruction which may be due to adhesions or internal hernia. Small pelvic ascites is seen. There is no pneumoperitoneum. There is no hydronephrosis. Diverticulosis of colon without signs of focal diverticulitis. Other findings as described in the body of the report. Electronically Signed   By: Elmer Picker M.D.   On: 08/02/2021 11:34   DG Abd Portable 1V-Small Bowel Obstruction Protocol-initial, 8 hr delay  Result Date: 08/03/2021 CLINICAL DATA:  Small bowel obstruction.  8 hour delay. EXAM: PORTABLE ABDOMEN - 1 VIEW COMPARISON:  Abdominopelvic CT yesterday. FINDINGS: Administered enteric contrast is seen within the hepatic flexure, descending, and sigmoid colon. There is mild persisting gaseous small bowel distention in the central abdomen. Right hip arthroplasty IMPRESSION: Administered enteric contrast within the colon. Mild persisting gaseous small bowel distention in the central abdomen. Electronically Signed   By: Keith Rake M.D.   On: 08/03/2021 21:04    Anti-infectives: Anti-infectives (From admission, onward)    Start     Dose/Rate Route Frequency Ordered Stop   08/04/21 1100  cefoTEtan (CEFOTAN) 2 g in sodium chloride 0.9 % 100 mL IVPB        2 g 200 mL/hr over 30 Minutes Intravenous On call to O.R. 08/04/21 1001 08/05/21 0559        Assessment/Plan SBO - CT suggests partial small bowel obstruction which may be due to adhesions or internal hernia - Symptoms resolved and patient having bowel function, but he remains concerned about recurrence because these episodes are happening more and more frequently. He understands that he is high risk with operative intervention but would like to proceed with a surgery. We will plan  for diagnostic laparoscopy, possible laparotomy, possible bowel resection tomorrow. [place] for clear liquids today, NPO after midnight.   ID - none FEN - CLD, NPO after MN VTE - SCDs, ok for chemical dvt prophylaxis from surgical standpoint Foley - none   HTN PAF not on anticoagulation CHF CKD-IV Hypothyroidism First degree heart block BPH Code status DNR   LOS: 1 day    Wellington Hampshire, [facility] Surgery 08/04/2021, 10:01 AM Please see Amion for pager number during day hours 7:00am-4:30pm

## 2021-08-05 ENCOUNTER — Encounter (HOSPITAL_COMMUNITY)
Admission: EM | Disposition: A | Discharge status: Home Health | Payer: Self-pay | Source: Home / Self Care | Attending: Internal Medicine

## 2021-08-05 ENCOUNTER — Encounter (HOSPITAL_COMMUNITY): Payer: Self-pay | Admitting: Family Medicine

## 2021-08-05 ENCOUNTER — Inpatient Hospital Stay (HOSPITAL_COMMUNITY): Payer: Medicare Other | Admitting: Certified Registered Nurse Anesthetist

## 2021-08-05 DIAGNOSIS — E43 Unspecified severe protein-calorie malnutrition: Secondary | ICD-10-CM | POA: Insufficient documentation

## 2021-08-05 DIAGNOSIS — K56609 Unspecified intestinal obstruction, unspecified as to partial versus complete obstruction: Secondary | ICD-10-CM | POA: Diagnosis not present

## 2021-08-05 HISTORY — PX: LAPAROTOMY: SHX154

## 2021-08-05 HISTORY — PX: LAPAROSCOPY: SHX197

## 2021-08-05 LAB — BASIC METABOLIC PANEL
Anion gap: 6 (ref 5–15)
BUN: 40 mg/dL — ABNORMAL HIGH (ref 8–23)
CO2: 20 mmol/L — ABNORMAL LOW (ref 22–32)
Calcium: 8.3 mg/dL — ABNORMAL LOW (ref 8.9–10.3)
Chloride: 114 mmol/L — ABNORMAL HIGH (ref 98–111)
Creatinine, Ser: 1.74 mg/dL — ABNORMAL HIGH (ref 0.61–1.24)
GFR, Estimated: 35 mL/min — ABNORMAL LOW (ref >60)
Glucose, Bld: 153 mg/dL — ABNORMAL HIGH (ref 70–99)
Potassium: 4.1 mmol/L (ref 3.5–5.1)
Sodium: 140 mmol/L (ref 135–145)

## 2021-08-05 LAB — CBC
HCT: 35.1 % — ABNORMAL LOW (ref 39.0–52.0)
Hemoglobin: 10.8 g/dL — ABNORMAL LOW (ref 13.0–17.0)
MCH: 28.1 pg (ref 26.0–34.0)
MCHC: 30.8 g/dL (ref 30.0–36.0)
MCV: 91.2 fL (ref 80.0–100.0)
Platelets: 170 10*3/uL (ref 150–400)
RBC: 3.85 MIL/uL — ABNORMAL LOW (ref 4.22–5.81)
RDW: 14.5 % (ref 11.5–15.5)
WBC: 5.5 10*3/uL (ref 4.0–10.5)
nRBC: 0 % (ref 0.0–0.2)

## 2021-08-05 LAB — GLUCOSE, CAPILLARY
Glucose-Capillary: 117 mg/dL — ABNORMAL HIGH (ref 70–99)
Glucose-Capillary: 127 mg/dL — ABNORMAL HIGH (ref 70–99)
Glucose-Capillary: 190 mg/dL — ABNORMAL HIGH (ref 70–99)

## 2021-08-05 LAB — TYPE AND SCREEN
ABO/RH(D): A POS
Antibody Screen: NEGATIVE

## 2021-08-05 LAB — SURGICAL PCR SCREEN
MRSA, PCR: NEGATIVE
Staphylococcus aureus: NEGATIVE

## 2021-08-05 LAB — MAGNESIUM: Magnesium: 2.3 mg/dL (ref 1.7–2.4)

## 2021-08-05 SURGERY — LAPAROSCOPY, DIAGNOSTIC
Anesthesia: General | Site: Abdomen

## 2021-08-05 MED ORDER — PHENYLEPHRINE HCL-NACL 20-0.9 MG/250ML-% IV SOLN
INTRAVENOUS | Status: DC | PRN
  Administered 2021-08-05: 25 ug/min via INTRAVENOUS

## 2021-08-05 MED ORDER — DEXAMETHASONE SODIUM PHOSPHATE 10 MG/ML IJ SOLN
INTRAMUSCULAR | Status: AC
  Filled 2021-08-05: qty 1

## 2021-08-05 MED ORDER — FENTANYL CITRATE (PF) 100 MCG/2ML IJ SOLN
25.0000 ug | INTRAMUSCULAR | Status: DC | PRN
  Administered 2021-08-05 (×2): 25 ug via INTRAVENOUS

## 2021-08-05 MED ORDER — PHENYLEPHRINE 40 MCG/ML (10ML) SYRINGE FOR IV PUSH (FOR BLOOD PRESSURE SUPPORT)
PREFILLED_SYRINGE | INTRAVENOUS | Status: DC | PRN
  Administered 2021-08-05: 40 ug via INTRAVENOUS

## 2021-08-05 MED ORDER — FENTANYL CITRATE (PF) 250 MCG/5ML IJ SOLN
INTRAMUSCULAR | Status: AC
  Filled 2021-08-05: qty 5

## 2021-08-05 MED ORDER — SODIUM CHLORIDE 0.9 % IV SOLN
INTRAVENOUS | Status: AC
  Filled 2021-08-05: qty 2

## 2021-08-05 MED ORDER — PROPOFOL 10 MG/ML IV BOLUS
INTRAVENOUS | Status: DC | PRN
  Administered 2021-08-05: 80 mg via INTRAVENOUS

## 2021-08-05 MED ORDER — SUCCINYLCHOLINE CHLORIDE 200 MG/10ML IV SOSY
PREFILLED_SYRINGE | INTRAVENOUS | Status: DC | PRN
  Administered 2021-08-05: 80 mg via INTRAVENOUS

## 2021-08-05 MED ORDER — DEXAMETHASONE SODIUM PHOSPHATE 10 MG/ML IJ SOLN
INTRAMUSCULAR | Status: DC | PRN
Start: 2021-08-05 — End: 2021-08-05
  Administered 2021-08-05: 4 mg via INTRAVENOUS

## 2021-08-05 MED ORDER — ROCURONIUM BROMIDE 10 MG/ML (PF) SYRINGE
PREFILLED_SYRINGE | INTRAVENOUS | Status: DC | PRN
Start: 2021-08-05 — End: 2021-08-05
  Administered 2021-08-05: 40 mg via INTRAVENOUS
  Administered 2021-08-05 (×3): 20 mg via INTRAVENOUS

## 2021-08-05 MED ORDER — BUPIVACAINE-EPINEPHRINE (PF) 0.25% -1:200000 IJ SOLN
INTRAMUSCULAR | Status: AC
  Filled 2021-08-05: qty 30

## 2021-08-05 MED ORDER — ORAL CARE MOUTH RINSE: 15.0000 mL | Freq: Once | OROMUCOSAL | Status: AC

## 2021-08-05 MED ORDER — ONDANSETRON HCL 4 MG/2ML IJ SOLN
INTRAMUSCULAR | Status: AC
  Filled 2021-08-05: qty 2

## 2021-08-05 MED ORDER — SUGAMMADEX SODIUM 200 MG/2ML IV SOLN
INTRAVENOUS | Status: DC | PRN
  Administered 2021-08-05: 300 mg via INTRAVENOUS

## 2021-08-05 MED ORDER — EPHEDRINE SULFATE-NACL 50-0.9 MG/10ML-% IV SOSY
PREFILLED_SYRINGE | INTRAVENOUS | Status: DC | PRN
  Administered 2021-08-05 (×3): 5 mg via INTRAVENOUS

## 2021-08-05 MED ORDER — LACTATED RINGERS IV SOLN: INTRAVENOUS | Status: DC

## 2021-08-05 MED ORDER — SODIUM CHLORIDE 0.9 % IR SOLN
Status: DC | PRN
  Administered 2021-08-05: 1

## 2021-08-05 MED ORDER — ROCURONIUM BROMIDE 10 MG/ML (PF) SYRINGE
PREFILLED_SYRINGE | INTRAVENOUS | Status: AC
  Filled 2021-08-05: qty 10

## 2021-08-05 MED ORDER — FENTANYL CITRATE (PF) 100 MCG/2ML IJ SOLN
INTRAMUSCULAR | Status: AC
  Administered 2021-08-05: 25 ug via INTRAVENOUS
  Filled 2021-08-05: qty 2

## 2021-08-05 MED ORDER — 0.9 % SODIUM CHLORIDE (POUR BTL) OPTIME
TOPICAL | Status: DC | PRN
  Administered 2021-08-05: 1000 mL

## 2021-08-05 MED ORDER — ACETAMINOPHEN 10 MG/ML IV SOLN
1000.0000 mg | Freq: Four times a day (QID) | INTRAVENOUS | Status: AC
  Administered 2021-08-05 – 2021-08-06 (×4): 1000 mg via INTRAVENOUS
  Filled 2021-08-05 (×5): qty 100

## 2021-08-05 MED ORDER — FENTANYL CITRATE (PF) 100 MCG/2ML IJ SOLN
INTRAMUSCULAR | Status: AC
  Filled 2021-08-05: qty 2

## 2021-08-05 MED ORDER — PHENYLEPHRINE 40 MCG/ML (10ML) SYRINGE FOR IV PUSH (FOR BLOOD PRESSURE SUPPORT)
PREFILLED_SYRINGE | INTRAVENOUS | Status: AC
  Filled 2021-08-05: qty 10

## 2021-08-05 MED ORDER — HYDROMORPHONE HCL 1 MG/ML IJ SOLN
0.5000 mg | INTRAMUSCULAR | Status: DC | PRN
  Administered 2021-08-05 – 2021-08-06 (×2): 0.5 mg via INTRAVENOUS
  Administered 2021-08-06 (×3): 1 mg via INTRAVENOUS
  Administered 2021-08-07 (×2): 0.5 mg via INTRAVENOUS
  Administered 2021-08-07 – 2021-08-11 (×11): 1 mg via INTRAVENOUS
  Filled 2021-08-05 (×18): qty 1

## 2021-08-05 MED ORDER — EPHEDRINE 5 MG/ML INJ
INTRAVENOUS | Status: AC
  Filled 2021-08-05: qty 5

## 2021-08-05 MED ORDER — PROPOFOL 10 MG/ML IV BOLUS
INTRAVENOUS | Status: AC
  Filled 2021-08-05: qty 20

## 2021-08-05 MED ORDER — SODIUM CHLORIDE 0.9 % IV SOLN
INTRAVENOUS | Status: DC | PRN
  Administered 2021-08-05: 2 g via INTRAVENOUS

## 2021-08-05 MED ORDER — SUCCINYLCHOLINE CHLORIDE 200 MG/10ML IV SOSY
PREFILLED_SYRINGE | INTRAVENOUS | Status: AC
  Filled 2021-08-05: qty 10

## 2021-08-05 MED ORDER — METOPROLOL TARTRATE 5 MG/5ML IV SOLN: 2.5000 mg | Freq: Two times a day (BID) | INTRAVENOUS | Status: DC

## 2021-08-05 MED ORDER — BUPIVACAINE-EPINEPHRINE 0.25% -1:200000 IJ SOLN
INTRAMUSCULAR | Status: DC | PRN
  Administered 2021-08-05: 20 mL

## 2021-08-05 MED ORDER — CHLORHEXIDINE GLUCONATE 0.12 % MT SOLN
OROMUCOSAL | Status: AC
  Administered 2021-08-05: 15 mL via OROMUCOSAL
  Filled 2021-08-05: qty 15

## 2021-08-05 MED ORDER — FENTANYL CITRATE (PF) 250 MCG/5ML IJ SOLN
INTRAMUSCULAR | Status: DC | PRN
  Administered 2021-08-05: 25 ug via INTRAVENOUS
  Administered 2021-08-05: 100 ug via INTRAVENOUS
  Administered 2021-08-05: 25 ug via INTRAVENOUS

## 2021-08-05 MED ORDER — LIDOCAINE 2% (20 MG/ML) 5 ML SYRINGE
INTRAMUSCULAR | Status: DC | PRN
  Administered 2021-08-05: 60 mg via INTRAVENOUS

## 2021-08-05 MED ORDER — ACETAMINOPHEN 10 MG/ML IV SOLN
INTRAVENOUS | Status: AC
  Filled 2021-08-05: qty 100

## 2021-08-05 MED ORDER — ENOXAPARIN SODIUM 30 MG/0.3ML IJ SOSY
30.0000 mg | PREFILLED_SYRINGE | INTRAMUSCULAR | Status: DC
  Administered 2021-08-06 – 2021-08-12 (×7): 30 mg via SUBCUTANEOUS
  Filled 2021-08-05 (×7): qty 0.3

## 2021-08-05 MED ORDER — LIDOCAINE 2% (20 MG/ML) 5 ML SYRINGE
INTRAMUSCULAR | Status: AC
  Filled 2021-08-05: qty 5

## 2021-08-05 MED ORDER — CHLORHEXIDINE GLUCONATE 0.12 % MT SOLN: 15.0000 mL | Freq: Once | OROMUCOSAL | Status: AC

## 2021-08-05 MED ORDER — ONDANSETRON HCL 4 MG/2ML IJ SOLN
INTRAMUSCULAR | Status: DC | PRN
  Administered 2021-08-05: 4 mg via INTRAVENOUS

## 2021-08-05 MED ORDER — PANTOPRAZOLE SODIUM 40 MG IV SOLR
40.0000 mg | Freq: Two times a day (BID) | INTRAVENOUS | Status: DC
  Administered 2021-08-05 – 2021-08-12 (×14): 40 mg via INTRAVENOUS
  Filled 2021-08-05 (×14): qty 40

## 2021-08-05 MED ORDER — DROPERIDOL 2.5 MG/ML IJ SOLN: 0.6250 mg | Freq: Once | INTRAMUSCULAR | Status: DC | PRN

## 2021-08-05 MED ORDER — DEXTROSE IN LACTATED RINGERS 5 % IV SOLN: INTRAVENOUS | Status: DC

## 2021-08-05 MED ORDER — METHOCARBAMOL 1000 MG/10ML IJ SOLN
500.0000 mg | Freq: Four times a day (QID) | INTRAVENOUS | Status: DC | PRN
  Filled 2021-08-05: qty 5

## 2021-08-05 SURGICAL SUPPLY — 71 items
ADH SKN CLS APL DERMABOND .7 (GAUZE/BANDAGES/DRESSINGS) ×2
APL PRP STRL LF DISP 70% ISPRP (MISCELLANEOUS) ×2
BAG COUNTER SPONGE SURGICOUNT (BAG) ×3 IMPLANT
BAG SPNG CNTER NS LX DISP (BAG) ×2
BLADE CLIPPER SURG (BLADE) ×2 IMPLANT
CANISTER SUCT 3000ML PPV (MISCELLANEOUS) ×3 IMPLANT
CHLORAPREP W/TINT 26 (MISCELLANEOUS) ×3 IMPLANT
COVER SURGICAL LIGHT HANDLE (MISCELLANEOUS) ×3 IMPLANT
DECANTER SPIKE VIAL GLASS SM (MISCELLANEOUS) ×1 IMPLANT
DERMABOND ADVANCED (GAUZE/BANDAGES/DRESSINGS) ×1
DERMABOND ADVANCED .7 DNX12 (GAUZE/BANDAGES/DRESSINGS) ×2 IMPLANT
DRAPE LAPAROSCOPIC ABDOMINAL (DRAPES) ×3 IMPLANT
DRAPE WARM FLUID 44X44 (DRAPES) ×1 IMPLANT
DRSG OPSITE POSTOP 4X10 (GAUZE/BANDAGES/DRESSINGS) IMPLANT
DRSG OPSITE POSTOP 4X6 (GAUZE/BANDAGES/DRESSINGS) ×2 IMPLANT
DRSG OPSITE POSTOP 4X8 (GAUZE/BANDAGES/DRESSINGS) IMPLANT
ELECT BLADE 6.5 EXT (BLADE) IMPLANT
ELECT CAUTERY BLADE 6.4 (BLADE) ×1 IMPLANT
ELECT REM PT RETURN 9FT ADLT (ELECTROSURGICAL) ×3
ELECTRODE REM PT RTRN 9FT ADLT (ELECTROSURGICAL) ×2 IMPLANT
GLOVE SRG 8 PF TXTR STRL LF DI (GLOVE) ×2 IMPLANT
GLOVE SURG ENC MOIS LTX SZ8 (GLOVE) ×3 IMPLANT
GLOVE SURG UNDER POLY LF SZ8 (GLOVE) ×3
GOWN STRL REUS W/ TWL LRG LVL3 (GOWN DISPOSABLE) ×4 IMPLANT
GOWN STRL REUS W/ TWL XL LVL3 (GOWN DISPOSABLE) ×2 IMPLANT
GOWN STRL REUS W/TWL LRG LVL3 (GOWN DISPOSABLE) ×6
GOWN STRL REUS W/TWL XL LVL3 (GOWN DISPOSABLE) ×3
HANDLE SUCTION POOLE (INSTRUMENTS) ×1 IMPLANT
IV NS 1000ML (IV SOLUTION)
IV NS 1000ML BAXH (IV SOLUTION) IMPLANT
KIT BASIN OR (CUSTOM PROCEDURE TRAY) ×3 IMPLANT
KIT TURNOVER KIT B (KITS) ×6 IMPLANT
LIGASURE IMPACT 36 18CM CVD LR (INSTRUMENTS) ×2 IMPLANT
NS IRRIG 1000ML POUR BTL (IV SOLUTION) ×6 IMPLANT
PACK GENERAL/GYN (CUSTOM PROCEDURE TRAY) ×1 IMPLANT
PAD ARMBOARD 7.5X6 YLW CONV (MISCELLANEOUS) ×3 IMPLANT
PENCIL SMOKE EVACUATOR (MISCELLANEOUS) ×3 IMPLANT
RELOAD PROXIMATE 75MM BLUE (ENDOMECHANICALS) ×6 IMPLANT
RELOAD STAPLE 75 3.8 BLU REG (ENDOMECHANICALS) IMPLANT
SCISSORS LAP 5X35 DISP (ENDOMECHANICALS) ×2 IMPLANT
SET IRRIG TUBING LAPAROSCOPIC (IRRIGATION / IRRIGATOR) IMPLANT
SET TUBE SMOKE EVAC HIGH FLOW (TUBING) ×3 IMPLANT
SHEARS HARMONIC ACE PLUS 36CM (ENDOMECHANICALS) ×2 IMPLANT
SLEEVE ENDOPATH XCEL 5M (ENDOMECHANICALS) ×7 IMPLANT
SOL ANTI FOG 6CC (MISCELLANEOUS) ×1 IMPLANT
SOLUTION ANTI FOG 6CC (MISCELLANEOUS) ×1
SPECIMEN JAR LARGE (MISCELLANEOUS) IMPLANT
SPONGE T-LAP 18X18 ~~LOC~~+RFID (SPONGE) ×2 IMPLANT
STAPLER GUN LINEAR PROX 60 (STAPLE) ×2 IMPLANT
STAPLER PROXIMATE 75MM BLUE (STAPLE) ×2 IMPLANT
STAPLER VISISTAT 35W (STAPLE) ×1 IMPLANT
SUCTION POOLE HANDLE (INSTRUMENTS)
SUT MNCRL AB 4-0 PS2 18 (SUTURE) ×5 IMPLANT
SUT PDS AB 1 TP1 54 (SUTURE) ×4 IMPLANT
SUT PDS AB 1 TP1 96 (SUTURE) ×2 IMPLANT
SUT VIC AB 2-0 SH 18 (SUTURE) ×3 IMPLANT
SUT VIC AB 3-0 SH 18 (SUTURE) ×5 IMPLANT
SUT VICRYL AB 2 0 TIES (SUTURE) ×1 IMPLANT
SUT VICRYL AB 3 0 TIES (SUTURE) ×1 IMPLANT
SYS LAPSCP GELPORT 120MM (MISCELLANEOUS) ×3
SYSTEM LAPSCP GELPORT 120MM (MISCELLANEOUS) ×1 IMPLANT
TOWEL GREEN STERILE (TOWEL DISPOSABLE) ×3 IMPLANT
TOWEL GREEN STERILE FF (TOWEL DISPOSABLE) ×3 IMPLANT
TRAY FOLEY MTR SLVR 16FR STAT (SET/KITS/TRAYS/PACK) IMPLANT
TRAY LAPAROSCOPIC MC (CUSTOM PROCEDURE TRAY) ×3 IMPLANT
TROCAR XCEL BLUNT TIP 100MML (ENDOMECHANICALS) IMPLANT
TROCAR XCEL NON-BLD 11X100MML (ENDOMECHANICALS) IMPLANT
TROCAR XCEL NON-BLD 5MMX100MML (ENDOMECHANICALS) ×3 IMPLANT
TUBE CONNECTING 12X1/4 (SUCTIONS) ×2 IMPLANT
WARMER LAPAROSCOPE (MISCELLANEOUS) ×1 IMPLANT
YANKAUER SUCT BULB TIP NO VENT (SUCTIONS) ×2 IMPLANT

## 2021-08-05 NOTE — Progress Notes (Signed)
PROGRESS NOTE    Nathan Kim  ELF:[id number] DOB: [birth year] DOA: 08/02/2021 PCP: [name], MD   Brief Narrative: 87 year old with past medical history significant for hypertension, PAF, diastolic heart failure, CKD stage IV, normocytic anemia, hypothyroidism, history of first-degree AV block, presents to the ED complaining of abdominal pain, poor appetite, nausea vomiting and weight loss.  He was discharged home September was 5 for 2 weeks with normal appetite, he will have episode of intermittent abdominal pain and not able to eat.  He presented to the ED and he was found to have a small bowel obstruction.  General surgery was consulted.  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 Continue n.p.o. post surgery Change Dilaudid to 0.5 to 1 mg as needed every 3 hours for pain Added IV Protonix twice daily Resume IV fluids Await pathology   2-PAF: On Lopressor, changed to IV.  Noted anticoagulation and aspirin.  Plan to hold aspirin today  CKD stage IIIb: Monitor renal function Continue range 5.8---5.2  Chronic diastolic heart failure: euvolemic continue to hold diuretic.  Monitor and IV fluids  Hypertension: Change metoprolol to IV Hypothyroidism: on Synthroid BPH ; Flomax     Nutrition Problem: Severe Malnutrition Etiology: chronic illness (CHF, CKD IV)    Signs/Symptoms: severe muscle depletion, severe fat  depletion    Interventions: MVI, Boost Breeze, Prostat  Estimated body mass index is 19.43 kg/m as calculated from the following:   Height as of this encounter: 5\' 11"  (1.803 m).   Weight as of this encounter: 63.2 kg.   DVT prophylaxis: Lovenox Code Status: DNR Family Communication: Daughter at bedside Disposition Plan:  Status is: Inpatient  Remains inpatient appropriate because: Patient admitted with SBO found to have cecum cancer        Consultants:  Surgery   Procedures:  Laparoscopic-assisted right hemicolectomy  Antimicrobials:    Subjective: Patient is alert, he is complaining of pain, he has NG tube in place.  Objective: Vitals:   08/05/21 1415 08/05/21 1430 08/05/21 1445 08/05/21 1546  BP: 130/62 130/66 (!) 122/59 (!) 124/59  Pulse: 68 69 67 73  Resp: 15 19 15 16   Temp:   (!) 97.4 F (36.3 C) 97.9 F (36.6 C)  TempSrc:      SpO2: 97% 96% 95% 90%  Weight:      Height:        Intake/Output Summary (Last 24 hours) at 08/05/2021 1609 Last data filed at 08/05/2021 1334 Gross per 24 hour  Intake 1300 ml  Output 255 ml  Net 1045 ml   Filed Weights   08/04/21 0429 08/05/21 0519 08/05/21 0906  Weight: 62.4 kg 63.2 kg 63.2 kg    Examination:  General exam: Appears calm and comfortable  Respiratory system: Clear to auscultation. Respiratory effort normal. Cardiovascular system: S1 & S2 heard, RRR.  Gastrointestinal system: Abdomen is nondistended, soft, mild tender, multiple incisions with dressing Central nervous system: Alert  Extremities: Symmetric 5 x 5 power.  Data Reviewed: I have personally reviewed following labs and imaging studies  CBC: Recent Labs  Lab 08/02/21 0636 08/03/21 0125 08/05/21 0114  WBC 3.6* 3.6* 5.5  HGB 10.9* 10.4* 10.8*  HCT 34.4* 33.6* 35.1*  MCV 90.5 90.6 91.2  PLT 189 162 833   Basic Metabolic Panel: Recent Labs  Lab 08/02/21 0636 08/03/21 0125 08/04/21 0100 08/05/21 0114  NA 140 142 144 140   K 3.7 3.4* 3.8 4.1  CL 107 114* 117* 114*  CO2 25 22 20* 20*  GLUCOSE 135* 101* 75 153*  BUN 61* 50* 45* 40*  CREATININE 2.13* 1.79* 1.73* 1.74*  CALCIUM 8.8* 8.3* 8.2* 8.3*  MG 2.3  --   --  2.3   GFR: Estimated Creatinine Clearance: 20.7 mL/min (A) (by C-G formula based on SCr of 1.74 mg/dL (H)). Liver Function Tests: Recent Labs  Lab 08/02/21 0636  AST 16  ALT 7  ALKPHOS 69  BILITOT 0.7  PROT 5.8*  ALBUMIN 3.3*   Recent Labs  Lab 08/02/21 0636  LIPASE 26   No results for input(s): AMMONIA in the last 168 hours. Coagulation Profile: No results for input(s): INR, PROTIME in the last 168 hours. Cardiac Enzymes: No results for input(s): CKTOTAL, CKMB, CKMBINDEX, TROPONINI in the last 168 hours. BNP (last 3 results) No results for input(s): PROBNP in the last 8760 hours. HbA1C: Recent Labs    08/03/21 0125  HGBA1C 6.7*   CBG: Recent Labs  Lab 08/04/21 1209 08/04/21 1646 08/04/21 2048 08/05/21 0800 08/05/21 0901  GLUCAP 118* 177* 188* 117* 127*   Lipid Profile: No results for input(s): CHOL, HDL, LDLCALC, TRIG, CHOLHDL, LDLDIRECT in the last 72 hours. Thyroid Function Tests: No results for input(s): TSH, T4TOTAL, FREET4, T3FREE, THYROIDAB in the last 72 hours. Anemia Panel: No results for input(s): VITAMINB12, FOLATE, FERRITIN, TIBC, IRON, RETICCTPCT in the last 72 hours. Sepsis Labs: Recent Labs  Lab 08/02/21 1300 08/02/21 1907  LATICACIDVEN 1.1 0.9    Recent Results (from the past 240 hour(s))  Resp Panel by RT-PCR (Flu A&B, Covid) Nasopharyngeal Swab     Status: None   Collection Time: 08/02/21 12:16 PM   Specimen: Nasopharyngeal Swab; Nasopharyngeal(NP) swabs in vial transport medium  Result Value Ref Range Status   SARS Coronavirus 2 by RT PCR NEGATIVE NEGATIVE Final    Comment: (NOTE) SARS-CoV-2 target nucleic acids are NOT DETECTED.  The SARS-CoV-2 RNA is generally detectable in upper respiratory specimens during the acute phase of  infection. The lowest concentration of SARS-CoV-2 viral copies this assay can detect is 138 copies/mL. A negative result does not preclude SARS-Cov-2 infection and should not be used as the sole basis for treatment or other patient management decisions. A negative result may occur with  improper specimen collection/handling, submission of specimen other than nasopharyngeal swab, presence of viral mutation(s) within the areas targeted by this assay, and inadequate number of viral copies(<138 copies/mL). A negative result must be combined with clinical observations, patient history, and epidemiological information. The expected result is Negative.  Fact Sheet for Patients:  EntrepreneurPulse.com.au  Fact Sheet for Healthcare Providers:  IncredibleEmployment.be  This test is no t yet approved or cleared by the [place] FDA and  has been authorized for detection and/or diagnosis of SARS-CoV-2 by FDA under an Emergency Use Authorization (EUA). This EUA will remain  in effect (meaning this test can be used) for the duration of the COVID-19 declaration under Section 564(b)(1) of the Act, 21 U.S.C.section 360bbb-3(b)(1), unless the  authorization is terminated  or revoked sooner.       Influenza A by PCR NEGATIVE NEGATIVE Final   Influenza B by PCR NEGATIVE NEGATIVE Final    Comment: (NOTE) The Xpert Xpress SARS-CoV-2/FLU/RSV plus assay is intended as an aid in the diagnosis of influenza from Nasopharyngeal swab specimens and should not be used as a sole basis for treatment. Nasal washings and aspirates are unacceptable for Xpert Xpress SARS-CoV-2/FLU/RSV testing.  Fact Sheet for Patients: EntrepreneurPulse.com.au  Fact Sheet for Healthcare Providers: IncredibleEmployment.be  This test is not yet approved or cleared by the [place] FDA and has been authorized for detection and/or diagnosis of SARS-CoV-2  by FDA under an Emergency Use Authorization (EUA). This EUA will remain in effect (meaning this test can be used) for the duration of the COVID-19 declaration under Section 564(b)(1) of the Act, 21 U.S.C. section 360bbb-3(b)(1), unless the authorization is terminated or revoked.  Performed at [facility], [place] [address] [place], [place] [address]   Surgical pcr screen     Status: None   Collection Time: 08/04/21  9:40 PM   Specimen: Nasal Mucosa; Nasal Swab  Result Value Ref Range Status   MRSA, PCR NEGATIVE NEGATIVE Final   Staphylococcus aureus NEGATIVE NEGATIVE Final    Comment: (NOTE) The Xpert SA Assay (FDA approved for NASAL specimens in patients 10 years of age and older), is one component of a comprehensive surveillance program. It is not intended to diagnose infection nor to guide or monitor treatment. Performed at [facility], [place] [address], [place], [place] [address]          Radiology Studies: DG Abd Portable 1V-Small Bowel Obstruction Protocol-initial, 8 hr delay  Result Date: 08/03/2021 CLINICAL DATA:  Small bowel obstruction.  8 hour delay. EXAM: PORTABLE ABDOMEN - 1 VIEW COMPARISON:  Abdominopelvic CT yesterday. FINDINGS: Administered enteric contrast is seen within the hepatic flexure, descending, and sigmoid colon. There is mild persisting gaseous small bowel distention in the central abdomen. Right hip arthroplasty IMPRESSION: Administered enteric contrast within the colon. Mild persisting gaseous small bowel distention in the central abdomen. Electronically Signed   By: Keith Rake M.D.   On: 08/03/2021 21:04        Scheduled Meds:  (feeding supplement) PROSource Plus  30 mL Oral TID BM   aspirin EC  81 mg Oral QODAY   feeding supplement  1 Container Oral TID BM   fentaNYL       insulin aspart  0-5 Units Subcutaneous QHS   insulin aspart  0-6 Units Subcutaneous TID WC   levothyroxine  50 mcg Oral QAC breakfast   metoprolol  tartrate  12.5 mg Oral BID   multivitamin with minerals  1 tablet Oral Daily   tamsulosin  0.4 mg Oral Daily   Continuous Infusions:  acetaminophen     acetaminophen       LOS: 2 days    Time spent: 35 minutes    Erik Burkett A Emric Kowalewski, MD [facility]   If 7PM-7AM, please contact night-coverage [email or web address]  08/05/2021, 4:09 PM

## 2021-08-05 NOTE — Transfer of Care (Signed)
Immediate Anesthesia Transfer of Care Note  Patient: Nathan Kim  Procedure(s) Performed: LAPAROSCOPIC ASSISTED RIGHT COLON RESECTION (Abdomen) EXPLORATORY LAPAROTOMY (Abdomen)  Patient Location: PACU  Anesthesia Type:General  Level of Consciousness: awake, alert  and oriented  Airway & Oxygen Therapy: Patient Spontanous Breathing and Patient connected to face mask oxygen  Post-op Assessment: Report given to RN and Post -op Vital signs reviewed and stable  Post vital signs: Reviewed and stable  Last Vitals:  Vitals Value Taken Time  BP    Temp    Pulse    Resp    SpO2      Last Pain:  Vitals:   08/05/21 0906  TempSrc: Oral  PainSc:          Complications: No notable events documented.

## 2021-08-05 NOTE — Anesthesia Procedure Notes (Addendum)
Procedure Name: Intubation Date/Time: 08/05/2021 11:05 AM Performed by: Dorthea Cove, CRNA Pre-anesthesia Checklist: Patient identified, Emergency Drugs available, Suction available and Patient being monitored Patient Re-evaluated:Patient Re-evaluated prior to induction Oxygen Delivery Method: Circle system utilized Preoxygenation: Pre-oxygenation with 100% oxygen Induction Type: IV induction, Rapid sequence and Cricoid Pressure applied Laryngoscope Size: Mac and 4 Grade View: Grade II Tube type: Oral Tube size: 7.5 mm Number of attempts: 1 Airway Equipment and Method: Stylet and Oral airway Placement Confirmation: ETT inserted through vocal cords under direct vision, positive ETCO2 and breath sounds checked- equal and bilateral Secured at: 22 cm Tube secured with: Tape Dental Injury: Teeth and Oropharynx as per pre-operative assessment  Comments: Atraumatic intubation

## 2021-08-05 NOTE — Plan of Care (Signed)
  Problem: Pain Managment: Goal: General experience of comfort will improve Outcome: Progressing   

## 2021-08-05 NOTE — Anesthesia Preprocedure Evaluation (Addendum)
Anesthesia Evaluation  Patient identified by MRN, date of birth, ID band Patient awake    Reviewed: Allergy & Precautions, NPO status , Patient's Chart, lab work & pertinent test results, reviewed documented beta blocker date and time   Airway Mallampati: II  TM Distance: >3 FB Neck ROM: Limited    Dental  (+) Dental Advisory Given, Teeth Intact, Chipped,    Pulmonary former smoker,    Pulmonary exam normal breath sounds clear to auscultation       Cardiovascular hypertension, Pt. on medications and Pt. on home beta blockers +CHF  + dysrhythmias Atrial Fibrillation  Rhythm:Regular Rate:Normal + Systolic murmurs Echo 54/6568 1. Left ventricular ejection fraction, by visual estimation, is 55 to 60%. The left ventricle has normal function. There is moderately increased left ventricular hypertrophy. Biplane LVEF and global longitudinal strain  measurements do not appear to be accurate.  2. Left ventricular diastolic parameters are indeterminate.  3. The left ventricle has no regional wall motion abnormalities.  4. Global right ventricle has normal systolic function.The right ventricular size is normal. No increase in right ventricular wall thickness.  5. Left atrial size was normal.  6. Right atrial size was upper normal.  7. Mild mitral annular calcification.  8. The mitral valve is grossly normal. Trivial mitral valve  regurgitation.  9. The tricuspid valve is grossly normal. Tricuspid valve regurgitation is trivial.  10. The aortic valve is tricuspid. Aortic valve regurgitation is not visualized. Mild aortic valve sclerosis without stenosis.  11. The pulmonic valve was grossly normal. Pulmonic valve regurgitation is trivial.  12. Normal pulmonary artery systolic pressure.  13. The inferior vena cava is normal in size with <50% respiratory variability, suggesting right atrial pressure of 8 mmHg.  14. The tricuspid regurgitant  velocity is 2.15 m/s, and with an assumed right atrial pressure of 8 mmHg, the estimated right ventricular systolic pressure is normal at 26.5 mmHg.    Neuro/Psych negative neurological ROS     GI/Hepatic negative GI ROS, Neg liver ROS,   Endo/Other  Hypothyroidism   Renal/GU CRF and Renal InsufficiencyRenal disease     Musculoskeletal  (+) Arthritis ,   Abdominal   Peds  Hematology  (+) Blood dyscrasia, anemia ,   Anesthesia Other Findings   Reproductive/Obstetrics                             Anesthesia Physical Anesthesia Plan  ASA: 3  Anesthesia Plan: General   Post-op Pain Management:    Induction: Intravenous, Rapid sequence and Cricoid pressure planned  PONV Risk Score and Plan: 4 or greater and Ondansetron, Dexamethasone, Treatment may vary due to age or medical condition and Diphenhydramine  Airway Management Planned: Oral ETT  Additional Equipment: None  Intra-op Plan:   Post-operative Plan: Extubation in OR  Informed Consent: I have reviewed the patients History and Physical, chart, labs and discussed the procedure including the risks, benefits and alternatives for the proposed anesthesia with the patient or authorized representative who has indicated his/her understanding and acceptance.   Patient has DNR.  Discussed DNR with patient, Discussed DNR with power of attorney and Suspend DNR.   Dental advisory given  Plan Discussed with: CRNA  Anesthesia Plan Comments:       Anesthesia Quick Evaluation

## 2021-08-05 NOTE — Op Note (Signed)
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  This resolved quickly has been discharged home but once going home he develops more abdominal pain, nausea vomiting and failure to thrive.  He is lost 10 pounds.  Overall, his health has been good considering his age.  Due to the recurrent nature of these partial small bowel obstructions he desired surgical exploration.  We discussed the pros and cons of this as well as advanced age and higher morbidity and mortality given that.  Nonoperative means were discussed as well and dietary changes.  Long discussion with patient for members and they were all in agreement to proceed with laparoscopy and subsequent intervention depending on findings.The procedure has been discussed with the patient.  Alternative therapies have been discussed with the patient.  Operative risks include bleeding,  Infection,  Organ injury,  Nerve injury,  Blood vessel injury,  DVT,  Pulmonary embolism,  Death,  And possible reoperation.  Medical management risks include worsening of present situation.  The success of the procedure is 50 -90 % at treating patients symptoms.  The patient understands and agrees to proceed.      Description of procedure: The patient was met in the holding area and questions were answered.  He was then taken back to the operative room.  He was placed supine upon the OR table.  After  induction general esthesia a Foley catheter was placed under sterile conditions.  He was then prepped and draped in a sterile fashion timeout performed.  He received appropriate preoperative antibiotics.  A left upper quadrant 5 mm port which was an Optiview port was placed with the help of the camera.  Advance to the layers abdominal wall without difficulty into the abdominal cavity without bowel injury.  Pneumoperitoneum was created to 15 mmHg of CO2 pressure.  I then placed a 5 mm port in the right upper quadrant and left lower quadrant.  Laparoscopy performed.  Gallbladder and stomach were normal.  Liver appeared normal.  Small bowel had mild dilation.  Identified the cecum.  The cecum was a large obstructing mass causing a partial obstruction at the ileocecal valve.  The transverse colon was tethered down to this as well creating an almost closed-loop obstruction.  I mobilized the right colon using harmonic scalpel from the white line of [place] all the way up to the hepatic flexure.  I then mobilized the small bowel out of the pelvis.  Care was taken not to injure the right ureter which was identified and the iliac vessels as well as gonadal vessels.  Once I did this I opted to place the HandPort.  This was done through a midline incision of about 6 cm.  This was oriented in a vertical fashion around the umbilicus.  Once this was done I was able to put a hand port.  The GelPort was then placed and I resume pneumoperitoneum.  I then was able to finish mobilizing the ileum and cecum up  into the HandPort.  This was then extracted at the [place].  Once this was done I divided the transverse colon at the hepatic flexure with a GIA 75 stapler.  A second load was fired across the terminal ileum.  LigaSure was used to take the mesentery down with good hemostasis.  Specimen was passed off the field.  I then closed mesenteric defect with 2-0 Vicryl.  We created a side-to-side functional end-to-end anastomosis between the  terminal ileum and the transverse colon.  This was done in a side-to-side fashion.  A GIA 75 stapler was used to create the common channel and a TA 60 was used to close the common enterotomy.  Crotch stitches were placed with 2-0 Vicryl.  I oversewed the staple line to for hemostasis purposes with 2-0 Vicryl.  The anastomosis was patent to 2 fingerbreadths with no signs of bleeding.  There is no undue tension or signs of ischemia at the anastomosis.  He was placed into his right lower quadrant.  I reexamined the retroperitoneal structures and they were intact.  There is no evidence of bleeding from the retroperitoneum.  Irrigation was then used and suctioned out.  The NG tube was palpated in the stomach.  I palpated the liver and felt no obvious metastatic disease.  The gallbladder palpated was normal.  Four-quadrant laparoscopy performed showed no evidence of bowel or colonic injury from port site insertion.  After this was done the pneumoperitoneum was released.  HandPort was removed.  Fascia then closed with single-stranded #1 PDS.  All skin incisions were closed with 4 Monocryl after port removal.  Dermabond applied.  All counts found to be correct.  The patient was awoke extubated taken to recovery in satisfactory condition.

## 2021-08-05 NOTE — Interval H&P Note (Signed)
History and Physical Interval Note:  08/05/2021 10:24 AM  Nathan Kim  has presented today for surgery, with the diagnosis of SMALL BOWEL OBSTRUCTION.  The various methods of treatment have been discussed with the patient and family. After consideration of risks, benefits and other options for treatment, the patient has consented to  Procedure(s): LAPAROSCOPY DIAGNOSTIC (N/A) EXPLORATORY LAPAROTOMY (N/A) SMALL BOWEL RESECTION (N/A) as a surgical intervention.  The patient's history has been reviewed, patient examined, no change in status, stable for surgery.  I have reviewed the patient's chart and labs.  Questions were answered to the patient's satisfaction.     [place]

## 2021-08-06 ENCOUNTER — Encounter (HOSPITAL_COMMUNITY): Payer: Self-pay | Admitting: Surgery

## 2021-08-06 DIAGNOSIS — K56609 Unspecified intestinal obstruction, unspecified as to partial versus complete obstruction: Secondary | ICD-10-CM | POA: Diagnosis not present

## 2021-08-06 LAB — GLUCOSE, CAPILLARY
Glucose-Capillary: 175 mg/dL — ABNORMAL HIGH (ref 70–99)
Glucose-Capillary: 183 mg/dL — ABNORMAL HIGH (ref 70–99)
Glucose-Capillary: 191 mg/dL — ABNORMAL HIGH (ref 70–99)
Glucose-Capillary: 113 mg/dL — ABNORMAL HIGH (ref 70–99)
Glucose-Capillary: 131 mg/dL — ABNORMAL HIGH (ref 70–99)

## 2021-08-06 LAB — COMPREHENSIVE METABOLIC PANEL
ALT: 8 U/L (ref 0–44)
AST: 15 U/L (ref 15–41)
Albumin: 2.7 g/dL — ABNORMAL LOW (ref 3.5–5.0)
Alkaline Phosphatase: 61 U/L (ref 38–126)
Anion gap: 6 (ref 5–15)
BUN: 35 mg/dL — ABNORMAL HIGH (ref 8–23)
CO2: 22 mmol/L (ref 22–32)
Calcium: 8.2 mg/dL — ABNORMAL LOW (ref 8.9–10.3)
Chloride: 112 mmol/L — ABNORMAL HIGH (ref 98–111)
Creatinine, Ser: 1.77 mg/dL — ABNORMAL HIGH (ref 0.61–1.24)
GFR, Estimated: 34 mL/min — ABNORMAL LOW (ref >60)
Glucose, Bld: 248 mg/dL — ABNORMAL HIGH (ref 70–99)
Potassium: 4.6 mmol/L (ref 3.5–5.1)
Sodium: 140 mmol/L (ref 135–145)
Total Bilirubin: 0.5 mg/dL (ref 0.3–1.2)
Total Protein: 5 g/dL — ABNORMAL LOW (ref 6.5–8.1)

## 2021-08-06 LAB — MAGNESIUM: Magnesium: 2.2 mg/dL (ref 1.7–2.4)

## 2021-08-06 LAB — CBC
HCT: 37.1 % — ABNORMAL LOW (ref 39.0–52.0)
Hemoglobin: 11.3 g/dL — ABNORMAL LOW (ref 13.0–17.0)
MCH: 27.9 pg (ref 26.0–34.0)
MCHC: 30.5 g/dL (ref 30.0–36.0)
MCV: 91.6 fL (ref 80.0–100.0)
Platelets: 165 10*3/uL (ref 150–400)
RBC: 4.05 MIL/uL — ABNORMAL LOW (ref 4.22–5.81)
RDW: 14.4 % (ref 11.5–15.5)
WBC: 11.7 10*3/uL — ABNORMAL HIGH (ref 4.0–10.5)
nRBC: 0 % (ref 0.0–0.2)

## 2021-08-06 MED ORDER — LIDOCAINE 5 % EX PTCH
1.0000 | MEDICATED_PATCH | CUTANEOUS | Status: DC
  Administered 2021-08-06 – 2021-08-10 (×5): 1 via TRANSDERMAL
  Filled 2021-08-06 (×5): qty 1

## 2021-08-06 MED ORDER — CHLORHEXIDINE GLUCONATE CLOTH 2 % EX PADS
6.0000 | MEDICATED_PAD | Freq: Every day | CUTANEOUS | Status: DC
  Administered 2021-08-06: 6 via TOPICAL

## 2021-08-06 MED ORDER — METOPROLOL TARTRATE 5 MG/5ML IV SOLN: 2.5000 mg | Freq: Two times a day (BID) | INTRAVENOUS | Status: DC | PRN

## 2021-08-06 MED ORDER — LACTATED RINGERS IV SOLN
INTRAVENOUS | Status: DC
Start: 2021-08-06 — End: 2021-08-09

## 2021-08-06 NOTE — Evaluation (Signed)
Physical Therapy Evaluation Patient Details Name: Nathan Kim MRN: [id number] DOB: 17-Feb-1920 Today's Date: 08/06/2021  History of Present Illness  Pt adm 10/30 with abdominal pain, N/V, and weight loss. Pt thought to have partial SBO. 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 PMH - back surgery, rt THR, ckd, arthritis, afib  Clinical Impression  Pt admitted with above diagnosis and presents to PT with functional limitations due to deficits listed below (See PT problem list). Pt needs skilled PT to maximize independence and safety to allow discharge to home with support of family.  Expect family will continue to make steady progress toward return to baseline.      Recommendations for follow up therapy are one component of a multi-disciplinary discharge planning process, led by the attending physician.  Recommendations may be updated based on patient status, additional functional criteria and insurance authorization.  Follow Up Recommendations Home health PT    Assistance Recommended at Discharge Intermittent Supervision/Assistance  Functional Status Assessment Patient has had a recent decline in their functional status and demonstrates the ability to make significant improvements in function in a reasonable and predictable amount of time.  Equipment Recommendations  None recommended by PT (pt has needed equipment)    Recommendations for Other Services       Precautions / Restrictions Precautions Precautions: Fall Restrictions Weight Bearing Restrictions: No      Mobility  Bed Mobility Overal bed mobility: Needs Assistance Bed Mobility: Rolling;Sidelying to Sit Rolling: Min assist Sidelying to sit: Min assist;HOB elevated       General bed mobility comments: Assist to elevate trunk into sitting and bring hips to EOB.    Transfers Overall transfer level: Needs assistance Equipment used: Rollator (4  wheels) Transfers: Sit to/from Stand Sit to Stand: Min assist           General transfer comment: Assist to bring hips up.    Ambulation/Gait Ambulation/Gait assistance: Min assist Gait Distance (Feet): 200 Feet Assistive device: Rollator (4 wheels) Gait Pattern/deviations: Step-through pattern;Decreased stride length;Trunk flexed Gait velocity: decr Gait velocity interpretation: <1.31 ft/sec, indicative of household ambulator General Gait Details: Assist for balance and support  Stairs            Wheelchair Mobility    Modified Rankin (Stroke Patients Only)       Balance Overall balance assessment: Needs assistance Sitting-balance support: No upper extremity supported;Feet supported Sitting balance-Leahy Scale: Fair     Standing balance support: Bilateral upper extremity supported Standing balance-Leahy Scale: Poor Standing balance comment: rollator and min guard for static standing                             Pertinent Vitals/Pain Pain Assessment: Faces Faces Pain Scale: Hurts whole lot Breathing: normal Negative Vocalization: none Facial Expression: smiling or inexpressive Body Language: relaxed Consolability: no need to console PAINAD Score: 0 Pain Descriptors / Indicators: Grimacing;Guarding Pain Intervention(s): Repositioned;Ice applied;Patient requesting pain meds-RN notified    Home Living Family/patient expects to be discharged to:: Private residence Living Arrangements: Spouse/significant other Available Help at Discharge: Family;Available 24 hours/day Type of Home: House Home Access: Stairs to enter Entrance Stairs-Rails: Right;Left;Can reach both Entrance Stairs-Number of Steps: 3   Home Layout: One level Home Equipment: [place] Walker (2 wheels);Rollator (4 wheels);Shower seat;Hand held shower head      Prior Function Prior Level of Function : Independent/Modified Independent  Mobility  Comments: Uses quad cane       Hand Dominance   Dominant Hand: Right    Extremity/Trunk Assessment   Upper Extremity Assessment Upper Extremity Assessment: Defer to OT evaluation    Lower Extremity Assessment Lower Extremity Assessment: Generalized weakness       Communication   Communication: HOH  Cognition Arousal/Alertness: Awake/alert Behavior During Therapy: WFL for tasks assessed/performed Overall Cognitive Status: Within Functional Limits for tasks assessed                                          General Comments      Exercises     Assessment/Plan    PT Assessment Patient needs continued PT services  PT Problem List Decreased strength;Decreased balance;Pain;Decreased mobility       PT Treatment Interventions DME instruction;Functional mobility training;Balance training;Patient/family education;Gait training;Therapeutic activities;Stair training;Therapeutic exercise    PT Goals (Current goals can be found in the Care Plan section)  Acute Rehab PT Goals Patient Stated Goal: return home PT Goal Formulation: With patient Time For Goal Achievement: 08/20/21 Potential to Achieve Goals: Good    Frequency Min 3X/week   Barriers to discharge Inaccessible home environment stairs to enter    Co-evaluation               AM-PAC PT "6 Clicks" Mobility  Outcome Measure Help needed turning from your back to your side while in a flat bed without using bedrails?: A Little Help needed moving from lying on your back to sitting on the side of a flat bed without using bedrails?: A Little Help needed moving to and from a bed to a chair (including a wheelchair)?: A Little Help needed standing up from a chair using your arms (e.g., wheelchair or bedside chair)?: A Little Help needed to walk in hospital room?: A Little Help needed climbing 3-5 steps with a railing? : A Little 6 Click Score: 18    End of Session   Activity Tolerance: Patient  tolerated treatment well Patient left: in chair;with call bell/phone within reach;with chair alarm set Nurse Communication: Mobility status;Patient requests pain meds PT Visit Diagnosis: Other abnormalities of gait and mobility (R26.89);Muscle weakness (generalized) (M62.81);Pain Pain - part of body:  (abdomen)    Time: 4097-3532 PT Time Calculation (min) (ACUTE ONLY): 29 min   Charges:   PT Evaluation $PT Eval Moderate Complexity: 1 Mod PT Treatments $Gait Training: 8-22 mins        [place] Pager [phone] Office [place] 08/06/2021, 10:15 AM

## 2021-08-06 NOTE — Progress Notes (Signed)
I asked Regalado MD if patient could take PO medications with the NG tube and turning suction off for 30 min after. She instructed to hold PO medications for now. Surgical PA just rounded with patient. PA states she will be placing orders to clamp NG tube and that patient can take PO medications this AM.

## 2021-08-06 NOTE — Progress Notes (Addendum)
OT Cancellation Note  Patient Details Name: Nathan Kim MRN: [id number] DOB: 04-Nov-1919   Cancelled Treatment:    Reason Eval/Treat Not Completed: Patient at procedure or test/ unavailable. Pt is with PT. Plan to reattempt at a later time.  Tyrone Schimke, OT Acute Rehabilitation Services Pager: [phone] Office: [phone]  08/06/2021, 9:28 AM   Addendum: Second attempt made at 13:35. Pt reporting 10/10 pain "when I swallow or try to move." Nursing notified. Plan to reattempt later today or tomorrow.

## 2021-08-06 NOTE — Progress Notes (Signed)
Telemonitor reported pt had 6 bts wide QRS. Pt denies pain. Notified on call X. Blount,NP via text page.

## 2021-08-06 NOTE — Progress Notes (Signed)
PROGRESS NOTE    Nathan Kim  ZLD:[id number] DOB: 02-Jun-1920 DOA: 08/02/2021 PCP: [name], MD   Brief Narrative: 87 year old with past medical history significant for hypertension, PAF, diastolic heart failure, CKD stage IV, normocytic anemia, hypothyroidism, history of first-degree AV block, presents to the ED complaining of abdominal pain, poor appetite, nausea vomiting and weight loss.  He was discharged home September was 5 for 2 weeks with normal appetite, he will have episode of intermittent abdominal pain and not able to eat.  He presented to the ED and he was found to have a small bowel obstruction.  General surgery was consulted.  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 Continue n.p.o. post surgery Dilaudid to 0.5 to 1 mg as needed every 3 hours for pain Continue with IV Protonix twice daily Continue with  IV fluids Await pathology  NG tube clamp today. Follow sx recommendations.   2-PAF: On Lopressor, changed to IV.  Noted anticoagulation and aspirin.  Plan to hold aspirin   CKD stage IIIb: Monitor renal function Continue range 1.5---1.7 Renal function stable.   Chronic diastolic heart failure: euvolemic continue to hold diuretic.  Monitor and IV fluids  Hypertension: Change metoprolol to IV, PRN  Hypothyroidism: on Synthroid BPH ; Flomax     Nutrition Problem: Severe Malnutrition Etiology:  chronic illness (CHF, CKD IV)    Signs/Symptoms: severe muscle depletion, severe fat depletion    Interventions: MVI, Boost Breeze, Prostat  Estimated body mass index is 19.43 kg/m as calculated from the following:   Height as of this encounter: 5\' 11"  (1.803 m).   Weight as of this encounter: 63.2 kg.   DVT prophylaxis: Lovenox Code Status: DNR Family Communication: Daughter at bedside Disposition Plan:  Status is: Inpatient  Remains inpatient appropriate because: Patient admitted with SBO found to have cecum cancer        Consultants:  Surgery   Procedures:  Laparoscopic-assisted right hemicolectomy  Antimicrobials:    Subjective: He is feeling better today, pain better controlled. Report pain with movement.   Objective: Vitals:   08/06/21 0453 08/06/21 0800 08/06/21 0836 08/06/21 1400  BP: (!) 99/55 (!) 110/56 (!) 105/51 118/61  Pulse: 68 67 71 69  Resp: 17 15 18 14   Temp: 97.7 F (36.5 C) (!) 97.3 F (36.3 C) (!) 97.3 F (36.3 C) (!) 97.4 F (36.3 C)  TempSrc: Oral Oral Oral Oral  SpO2: 95% 97% 98%   Weight:      Height:        Intake/Output Summary (Last 24 hours) at 08/06/2021 1452 Last data filed at 08/06/2021 1000 Gross per 24 hour  Intake 792.01 ml  Output 675 ml  Net 117.01 ml    Filed Weights   08/04/21 0429 08/05/21 0519 08/05/21 0906  Weight: 62.4 kg 63.2 kg 63.2 kg    Examination:  General exam: NAD Respiratory system: CTA Cardiovascular system: S 1, S 2 RRR Gastrointestinal system: BS decreased, abdomen soft, mild tenderness, incision  with dressing Central nervous system: alert, follows command Extremities: no edema    Data Reviewed: I have personally reviewed following labs and imaging studies  CBC: Recent Labs  Lab 08/02/21 0636 08/03/21 0125 08/05/21 0114 08/06/21 0033  WBC 3.6* 3.6* 5.5 11.7*  HGB 10.9* 10.4* 10.8* 11.3*  HCT 34.4* 33.6* 35.1* 37.1*  MCV 90.5 90.6 91.2 91.6  PLT 189 162 170 165     Basic Metabolic Panel: Recent Labs  Lab 08/02/21 0636 08/03/21 0125 08/04/21 0100 08/05/21 0114 08/06/21 0033  NA 140 142 144 140 140  K 3.7 3.4* 3.8 4.1 4.6  CL 107 114* 117* 114* 112*  CO2 25 22 20* 20* 22  GLUCOSE 135* 101* 75 153* 248*  BUN 61* 50* 45* 40* 35*  CREATININE 2.13* 1.79* 1.73* 1.74* 1.77*  CALCIUM 8.8* 8.3* 8.2* 8.3* 8.2*  MG 2.3  --   --  2.3 2.2    GFR: Estimated Creatinine Clearance: 20.3 mL/min (A) (by C-G formula based on SCr of 1.77 mg/dL (H)). Liver Function Tests: Recent Labs  Lab 08/02/21 0636 08/06/21 0033  AST 16 15  ALT 7 8  ALKPHOS 69 61  BILITOT 0.7 0.5  PROT 5.8* 5.0*  ALBUMIN 3.3* 2.7*    Recent Labs  Lab 08/02/21 0636  LIPASE 26    No results for input(s): AMMONIA in the last 168 hours. Coagulation Profile: No results for input(s): INR, PROTIME in the last 168 hours. Cardiac Enzymes: No results for input(s): CKTOTAL, CKMB, CKMBINDEX, TROPONINI in the last 168 hours. BNP (last 3 results) No results for input(s): PROBNP in the last 8760 hours. HbA1C: No results for input(s): HGBA1C in the last 72 hours.  CBG: Recent Labs  Lab 08/05/21 0901 08/05/21 1715 08/05/21 2121 08/06/21 0840 08/06/21 1141  GLUCAP 127* 175* 190* 183* 191*    Lipid Profile: No results for input(s): CHOL, HDL, LDLCALC, TRIG, CHOLHDL, LDLDIRECT in the last 72 hours. Thyroid Function Tests: No results for input(s): TSH, T4TOTAL, FREET4, T3FREE, THYROIDAB in the last 72 hours. Anemia Panel: No results for input(s): VITAMINB12, FOLATE, FERRITIN, TIBC, IRON, RETICCTPCT in the last 72 hours. Sepsis Labs: Recent Labs  Lab 08/02/21 1300 08/02/21 1907  LATICACIDVEN 1.1 0.9     Recent Results (from the past 240 hour(s))  Resp Panel by RT-PCR (Flu A&B, Covid) Nasopharyngeal Swab     Status: None   Collection Time: 08/02/21 12:16 PM   Specimen: Nasopharyngeal Swab; Nasopharyngeal(NP) swabs in vial transport medium  Result Value Ref Range  Status   SARS Coronavirus 2 by RT PCR NEGATIVE NEGATIVE Final    Comment: (NOTE) SARS-CoV-2 target nucleic acids are NOT DETECTED.  The SARS-CoV-2 RNA is generally detectable in upper respiratory specimens during the acute phase of infection. The lowest concentration of SARS-CoV-2 viral copies this assay can detect is 138 copies/mL. A negative result does not preclude SARS-Cov-2 infection and should not be used as the sole basis for treatment or other patient management decisions. A negative result may occur with  improper specimen collection/handling, submission of specimen other than nasopharyngeal swab, presence of viral mutation(s) within the areas targeted by this assay, and inadequate number of viral copies(<138 copies/mL). A negative result must be combined with clinical observations, patient history, and epidemiological information. The expected result is Negative.  Fact Sheet for Patients:  EntrepreneurPulse.com.au  Fact Sheet for Healthcare Providers:  IncredibleEmployment.be  This test is no t yet approved or cleared by the [place] and  has been authorized  for detection and/or diagnosis of SARS-CoV-2 by FDA under an Emergency Use Authorization (EUA). This EUA will remain  in effect (meaning this test can be used) for the duration of the COVID-19 declaration under Section 564(b)(1) of the Act, 21 U.S.C.section 360bbb-3(b)(1), unless the authorization is terminated  or revoked sooner.       Influenza A by PCR NEGATIVE NEGATIVE Final   Influenza B by PCR NEGATIVE NEGATIVE Final    Comment: (NOTE) The Xpert Xpress SARS-CoV-2/FLU/RSV plus assay is intended as an aid in the diagnosis of influenza from Nasopharyngeal swab specimens and should not be used as a sole basis for treatment. Nasal washings and aspirates are unacceptable for Xpert Xpress SARS-CoV-2/FLU/RSV testing.  Fact Sheet for  Patients: EntrepreneurPulse.com.au  Fact Sheet for Healthcare Providers: IncredibleEmployment.be  This test is not yet approved or cleared by the [place] FDA and has been authorized for detection and/or diagnosis of SARS-CoV-2 by FDA under an Emergency Use Authorization (EUA). This EUA will remain in effect (meaning this test can be used) for the duration of the COVID-19 declaration under Section 564(b)(1) of the Act, 21 U.S.C. section 360bbb-3(b)(1), unless the authorization is terminated or revoked.  Performed at [facility], [place] [address], [place], [place] [address]   Surgical pcr screen     Status: None   Collection Time: 08/04/21  9:40 PM   Specimen: Nasal Mucosa; Nasal Swab  Result Value Ref Range Status   MRSA, PCR NEGATIVE NEGATIVE Final   Staphylococcus aureus NEGATIVE NEGATIVE Final    Comment: (NOTE) The Xpert SA Assay (FDA approved for NASAL specimens in patients 42 years of age and older), is one component of a comprehensive surveillance program. It is not intended to diagnose infection nor to guide or monitor treatment. Performed at [facility], [place] [address], [place], [place] [address]           Radiology Studies: No results found.      Scheduled Meds:  Chlorhexidine Gluconate Cloth  6 each Topical Daily   enoxaparin (LOVENOX) injection  30 mg Subcutaneous Q24H   insulin aspart  0-5 Units Subcutaneous QHS   insulin aspart  0-6 Units Subcutaneous TID WC   levothyroxine  50 mcg Oral QAC breakfast   lidocaine  1 patch Transdermal Q24H   multivitamin with minerals  1 tablet Oral Daily   pantoprazole (PROTONIX) IV  40 mg Intravenous Q12H   tamsulosin  0.4 mg Oral Daily   Continuous Infusions:  lactated ringers 75 mL/hr at 08/06/21 1015   methocarbamol (ROBAXIN) IV       LOS: 3 days    Time spent: 35 minutes    Chi Woodham A Adyen Bifulco, MD [facility]   If 7PM-7AM, please contact  night-coverage [email or web address]  08/06/2021, 2:52 PM

## 2021-08-06 NOTE — Anesthesia Postprocedure Evaluation (Signed)
Anesthesia Post Note  Patient: Nathan Kim  Procedure(s) Performed: LAPAROSCOPIC ASSISTED RIGHT COLON RESECTION (Abdomen) EXPLORATORY LAPAROTOMY (Abdomen)     Patient location during evaluation: PACU Anesthesia Type: General Level of consciousness: sedated and patient cooperative Pain management: pain level controlled Vital Signs Assessment: post-procedure vital signs reviewed and stable Respiratory status: spontaneous breathing Cardiovascular status: stable Anesthetic complications: no   No notable events documented.  Last Vitals:  Vitals:   08/06/21 0018 08/06/21 0453  BP: (!) 105/58 (!) 99/55  Pulse: 72 68  Resp: 17 17  Temp: 36.7 C 36.5 C  SpO2: 98% 95%    Last Pain:  Vitals:   08/06/21 0453  TempSrc: Oral  PainSc:                  Nolon Nations

## 2021-08-06 NOTE — Care Management (Signed)
Went to bedside to discuss home health PT. Sister in law present, patient's wife just left room. Patient just requested pain medication. NCM left [email or web address] list at bedside and will follow up later.

## 2021-08-06 NOTE — Progress Notes (Signed)
Mobility Specialist Progress Note:   08/06/21 1600  Mobility  Activity Refused mobility  $Mobility charge 1 Mobility   Pt c/o fatigue, and generalized pain. Will f/u tomorrow for hopeful ambulation.  Nelta Numbers Mobility Specialist  Phone [phone]

## 2021-08-06 NOTE — Care Management Important Message (Signed)
Important Message  Patient Details  Name: Nathan Kim MRN: [id number] Date of Birth: 1920/01/05   Medicare Important Message Given:  Yes     Cardale Dorer 08/06/2021, 3:12 PM

## 2021-08-06 NOTE — Progress Notes (Signed)
NG tube was clamped around 0945 this AM. Patient drink around 60 ml of water with his pills around noon. No complaints of nausea or discomfort. At 1430 connected the NG tube back to LIS after 30 mins the output from NG tube was 49ml. [place], [place] was notified and gave orders to remove NG tube and start clear liquid diet

## 2021-08-06 NOTE — Progress Notes (Signed)
Patient ID: Nathan Kim, male   DOB: 1920-02-12, 87 y.o.   MRN: [id number] [facility] Surgery Progress Note  1 Day Post-Op  Subjective: CC-  Abd pain worse with movement, controlled at rest. Denies flatus or BM. Has not been OOB. Daughter at bedside.   Objective: Vital signs in last 24 hours: Temp:  [97.3 F (36.3 C)-98 F (36.7 C)] 97.3 F (36.3 C) (11/03 0836) Pulse Rate:  [67-73] 71 (11/03 0836) Resp:  [15-25] 18 (11/03 0836) BP: (99-137)/(51-66) 105/51 (11/03 0836) SpO2:  [90 %-98 %] 98 % (11/03 0836) Last BM Date: 08/04/21  Intake/Output from previous day: 11/02 0701 - 11/03 0700 In: 2092 [I.V.:1792; IV Piggyback:300] Out: 705 [Urine:680; Blood:25] Intake/Output this shift: Total I/O In: -  Out: 100 [Emesis/NG output:100]  PE: Gen:  Alert, NAD, pleasant, NG in L nare  Cardio: RRR Pulm: rate and effort normal on room air Abd: Soft, appropriately tender, incisions c/d/I, +BS Psych: A&Ox3  Lab Results:  Recent Labs    08/05/21 0114 08/06/21 0033  WBC 5.5 11.7*  HGB 10.8* 11.3*  HCT 35.1* 37.1*  PLT 170 165   BMET Recent Labs    08/05/21 0114 08/06/21 0033  NA 140 140  K 4.1 4.6  CL 114* 112*  CO2 20* 22  GLUCOSE 153* 248*  BUN 40* 35*  CREATININE 1.74* 1.77*  CALCIUM 8.3* 8.2*   PT/INR No results for input(s): LABPROT, INR in the last 72 hours. CMP     Component Value Date/Time   NA 140 08/06/2021 0033   NA 140 01/20/2017 1145   K 4.6 08/06/2021 0033   CL 112 (H) 08/06/2021 0033   CO2 22 08/06/2021 0033   GLUCOSE 248 (H) 08/06/2021 0033   BUN 35 (H) 08/06/2021 0033   BUN 36 01/20/2017 1145   CREATININE 1.77 (H) 08/06/2021 0033   CREATININE 1.78 (H) 12/16/2016 1110   CALCIUM 8.2 (L) 08/06/2021 0033   PROT 5.0 (L) 08/06/2021 0033   ALBUMIN 2.7 (L) 08/06/2021 0033   AST 15 08/06/2021 0033   ALT 8 08/06/2021 0033   ALKPHOS 61 08/06/2021 0033   BILITOT 0.5 08/06/2021 0033   GFRNONAA 34 (L) 08/06/2021 0033   GFRAA 27 (L)  10/22/2019 1116   Lipase     Component Value Date/Time   LIPASE 26 08/02/2021 0636       Studies/Results: No results found.  Anti-infectives: Anti-infectives (From admission, onward)    Start     Dose/Rate Route Frequency Ordered Stop   08/05/21 0921  sodium chloride 0.9 % with cefoTEtan (CEFOTAN) ADS Med       Note to Pharmacy: Gregery Na   : cabinet override      08/05/21 0921 08/05/21 1129   08/04/21 1100  cefoTEtan (CEFOTAN) 2 g in sodium chloride 0.9 % 100 mL IVPB  Status:  Discontinued        2 g 200 mL/hr over 30 Minutes Intravenous On call to O.R. 08/04/21 1001 08/05/21 0559        Assessment/Plan Bowel obstruction secondary to cecal mass  POD#1 S/P LAPAROSCOPIC ASSISTED RIGHT HEMICOLECTOMY 08/06/21 Dr. Brantley Stage - afebrile, VSS, WBC 11  - await surgical pathology - await return of bowel function - IS and pulm toilet  - PT/OT   ID - none FEN - NG clamp trial, ice/sips with meds ok VTE - SCDs, lovenox Foley - D/C today POD#1   HTN PAF not on anticoagulation CHF CKD-IV Hypothyroidism First degree heart block BPH Code status  DNR   LOS: 3 days    [facility] 08/06/2021, 9:29 AM Please see Amion for pager number during day hours 7:00am-4:30pm

## 2021-08-07 ENCOUNTER — Other Ambulatory Visit: Payer: Self-pay

## 2021-08-07 DIAGNOSIS — K56609 Unspecified intestinal obstruction, unspecified as to partial versus complete obstruction: Secondary | ICD-10-CM | POA: Diagnosis not present

## 2021-08-07 LAB — BASIC METABOLIC PANEL
Anion gap: 6 (ref 5–15)
BUN: 34 mg/dL — ABNORMAL HIGH (ref 8–23)
CO2: 22 mmol/L (ref 22–32)
Calcium: 8.3 mg/dL — ABNORMAL LOW (ref 8.9–10.3)
Chloride: 110 mmol/L (ref 98–111)
Creatinine, Ser: 1.97 mg/dL — ABNORMAL HIGH (ref 0.61–1.24)
GFR, Estimated: 30 mL/min — ABNORMAL LOW (ref >60)
Glucose, Bld: 118 mg/dL — ABNORMAL HIGH (ref 70–99)
Potassium: 4.4 mmol/L (ref 3.5–5.1)
Sodium: 138 mmol/L (ref 135–145)

## 2021-08-07 LAB — CBC
HCT: 39.8 % (ref 39.0–52.0)
Hemoglobin: 12.2 g/dL — ABNORMAL LOW (ref 13.0–17.0)
MCH: 28.3 pg (ref 26.0–34.0)
MCHC: 30.7 g/dL (ref 30.0–36.0)
MCV: 92.3 fL (ref 80.0–100.0)
Platelets: 208 10*3/uL (ref 150–400)
RBC: 4.31 MIL/uL (ref 4.22–5.81)
RDW: 14.7 % (ref 11.5–15.5)
WBC: 13.4 10*3/uL — ABNORMAL HIGH (ref 4.0–10.5)
nRBC: 0 % (ref 0.0–0.2)

## 2021-08-07 LAB — GLUCOSE, CAPILLARY
Glucose-Capillary: 112 mg/dL — ABNORMAL HIGH (ref 70–99)
Glucose-Capillary: 101 mg/dL — ABNORMAL HIGH (ref 70–99)
Glucose-Capillary: 118 mg/dL — ABNORMAL HIGH (ref 70–99)
Glucose-Capillary: 116 mg/dL — ABNORMAL HIGH (ref 70–99)

## 2021-08-07 LAB — MAGNESIUM: Magnesium: 2.3 mg/dL (ref 1.7–2.4)

## 2021-08-07 MED ORDER — METOPROLOL TARTRATE 5 MG/5ML IV SOLN
2.5000 mg | Freq: Two times a day (BID) | INTRAVENOUS | Status: DC
Start: 2021-08-07 — End: 2021-08-07
  Administered 2021-08-07: 2.5 mg via INTRAVENOUS
  Filled 2021-08-07: qty 5

## 2021-08-07 MED ORDER — METOPROLOL TARTRATE 12.5 MG HALF TABLET
12.5000 mg | ORAL_TABLET | Freq: Two times a day (BID) | ORAL | Status: DC
  Administered 2021-08-07 – 2021-08-12 (×8): 12.5 mg via ORAL
  Filled 2021-08-07 (×9): qty 1

## 2021-08-07 MED ORDER — FUROSEMIDE 20 MG PO TABS
20.0000 mg | ORAL_TABLET | Freq: Every day | ORAL | Status: DC
  Administered 2021-08-07: 20 mg via ORAL
  Filled 2021-08-07: qty 1

## 2021-08-07 NOTE — Progress Notes (Signed)
PROGRESS NOTE    Nathan Kim  KAJ:[id number] DOB: [birth year] DOA: 08/02/2021 PCP: [name], MD   Brief Narrative: 87 year old with past medical history significant for hypertension, PAF, diastolic heart failure, CKD stage IV, normocytic anemia, hypothyroidism, history of first-degree AV block, presents to the ED complaining of abdominal pain, poor appetite, nausea vomiting and weight loss.  He was discharged home September was 5 for 2 weeks with normal appetite, he will have episode of intermittent abdominal pain and not able to eat.  He presented to the ED and he was found to have a small bowel obstruction.  General surgery was consulted.  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 Continue n.p.o. post surgery Dilaudid to 0.5 to 1 mg as needed every 3 hours for pain Continue with IV Protonix twice daily Continue with  IV fluids, rate decreased.  Await pathology  NG tube removed. Started on clear diet.   2-PAF: resume home dose metoprolol/.  Resume aspirin when ok by Sx.   CKD stage IIIb: Monitor renal function Continue range 1.5---1.7 Mildly increase cr to 1.9.  Lasix ordered by Sx. Monitor renal function on lasix.   Chronic diastolic heart failure: euvolemic continue to hold diuretic.  Monitor and IV fluids  Hypertension: Change metoprolol to IV, PRN  Hypothyroidism: on Synthroid BPH ; Flomax Hands edema;  suspect related to hypoalbuminemia.     Nutrition Problem: Severe Malnutrition Etiology: chronic illness (CHF, CKD IV)    Signs/Symptoms: severe muscle depletion, severe fat depletion    Interventions: MVI, Boost Breeze, Prostat  Estimated body mass index is 19.87 kg/m as calculated from the following:   Height as of this encounter: 5\' 11"  (1.803 m).   Weight as of this encounter: 64.6 kg.   DVT prophylaxis: Lovenox Code Status: DNR Family Communication: Daughter at bedside 11/03 Disposition Plan:  Status is: Inpatient  Remains inpatient appropriate because: Patient admitted with SBO found to have cecum cancer        Consultants:  Surgery   Procedures:  Laparoscopic-assisted right hemicolectomy  Antimicrobials:    Subjective: NGT removed.  He is feeling better. Report hand swelling.  Complaints of abdominal pain.  He was started on clear , so far tolerating.   Objective: Vitals:   08/06/21 1643 08/06/21 1936 08/07/21 0515 08/07/21 0805  BP: (!) 109/52 113/64 114/62 119/60  Pulse: 73 73 79 79  Resp: 19 17  19   Temp: 98.4 F (36.9 C) 97.8 F (36.6 C) 98.4 F (36.9 C) 98 F (36.7 C)  TempSrc: Oral Oral Oral Oral  SpO2: 96% 96%  100%  Weight:   64.6 kg   Height:        Intake/Output Summary (Last 24 hours) at 08/07/2021 1432 Last data filed at 08/07/2021 0851 Gross per 24 hour  Intake 1606.01 ml  Output 475 ml  Net 1131.01 ml    Filed Weights   08/05/21 0519 08/05/21 0906 08/07/21 0515  Weight: 63.2 kg 63.2 kg 64.6 kg  Examination:  General exam: NAD Respiratory system: CTA Cardiovascular system: S 1, S 2 RRR Gastrointestinal system: BS decreased, soft, mild tenderness,  incision with dressing Central nervous system: Alert, follows command Extremities: No edema    Data Reviewed: I have personally reviewed following labs and imaging studies  CBC: Recent Labs  Lab 08/02/21 0636 08/03/21 0125 08/05/21 0114 08/06/21 0033  08/07/21 0922  WBC 3.6* 3.6* 5.5 11.7* 13.4*  HGB 10.9* 10.4* 10.8* 11.3* 12.2*  HCT 34.4* 33.6* 35.1* 37.1* 39.8  MCV 90.5 90.6 91.2 91.6 92.3  PLT 189 162 170 165 774    Basic Metabolic Panel: Recent Labs  Lab 08/02/21 0636 08/03/21 0125 08/04/21 0100 08/05/21 0114 08/06/21 0033 08/07/21 0922  NA 140 142 144 140 140 138  K 3.7 3.4* 3.8 4.1 4.6 4.4  CL 107 114* 117* 114* 112* 110  CO2 25 22 20* 20* 22 22  GLUCOSE 135* 101* 75 153* 248* 118*  BUN 61* 50* 45* 40* 35* 34*  CREATININE 2.13* 1.79* 1.73* 1.74* 1.77* 1.97*  CALCIUM 8.8* 8.3* 8.2* 8.3* 8.2* 8.3*  MG 2.3  --   --  2.3 2.2 2.3    GFR: Estimated Creatinine Clearance: 18.7 mL/min (A) (by C-G formula based on SCr of 1.97 mg/dL (H)). Liver Function Tests: Recent Labs  Lab 08/02/21 0636 08/06/21 0033  AST 16 15  ALT 7 8  ALKPHOS 69 61  BILITOT 0.7 0.5  PROT 5.8* 5.0*  ALBUMIN 3.3* 2.7*    Recent Labs  Lab 08/02/21 0636  LIPASE 26    No results for input(s): AMMONIA in the last 168 hours. Coagulation Profile: No results for input(s): INR, PROTIME in the last 168 hours. Cardiac Enzymes: No results for input(s): CKTOTAL, CKMB, CKMBINDEX, TROPONINI in the last 168 hours. BNP (last 3 results) No results for input(s): PROBNP in the last 8760 hours. HbA1C: No results for input(s): HGBA1C in the last 72 hours.  CBG: Recent Labs  Lab 08/06/21 1141 08/06/21 1711 08/06/21 2232 08/07/21 0759 08/07/21 1143  GLUCAP 191* 113* 131* 112* 101*    Lipid Profile: No results for input(s): CHOL, HDL, LDLCALC, TRIG, CHOLHDL, LDLDIRECT in the last 72 hours. Thyroid Function Tests: No results for input(s): TSH, T4TOTAL, FREET4, T3FREE, THYROIDAB in the last 72 hours. Anemia Panel: No results for input(s): VITAMINB12, FOLATE, FERRITIN, TIBC, IRON, RETICCTPCT in the last 72 hours. Sepsis Labs: Recent Labs  Lab 08/02/21 1300 08/02/21 1907  LATICACIDVEN 1.1 0.9     Recent Results (from the past 240 hour(s))   Resp Panel by RT-PCR (Flu A&B, Covid) Nasopharyngeal Swab     Status: None   Collection Time: 08/02/21 12:16 PM   Specimen: Nasopharyngeal Swab; Nasopharyngeal(NP) swabs in vial transport medium  Result Value Ref Range Status   SARS Coronavirus 2 by RT PCR NEGATIVE NEGATIVE Final    Comment: (NOTE) SARS-CoV-2 target nucleic acids are NOT DETECTED.  The SARS-CoV-2 RNA is generally detectable in upper respiratory specimens during the acute phase of infection. The lowest concentration of SARS-CoV-2 viral copies this assay can detect is 138 copies/mL. A negative result does not preclude SARS-Cov-2 infection and should not be used as the sole basis for treatment or other patient management decisions. A negative result may occur with  improper specimen collection/handling, submission of specimen other than nasopharyngeal swab, presence of viral mutation(s) within the areas targeted by this assay, and inadequate number of viral copies(<138 copies/mL). A negative result must be combined with clinical observations, patient history, and  epidemiological information. The expected result is Negative.  Fact Sheet for Patients:  EntrepreneurPulse.com.au  Fact Sheet for Healthcare Providers:  IncredibleEmployment.be  This test is no t yet approved or cleared by the [place] FDA and  has been authorized for detection and/or diagnosis of SARS-CoV-2 by FDA under an Emergency Use Authorization (EUA). This EUA will remain  in effect (meaning this test can be used) for the duration of the COVID-19 declaration under Section 564(b)(1) of the Act, 21 U.S.C.section 360bbb-3(b)(1), unless the authorization is terminated  or revoked sooner.       Influenza A by PCR NEGATIVE NEGATIVE Final   Influenza B by PCR NEGATIVE NEGATIVE Final    Comment: (NOTE) The Xpert Xpress SARS-CoV-2/FLU/RSV plus assay is intended as an aid in the diagnosis of influenza from  Nasopharyngeal swab specimens and should not be used as a sole basis for treatment. Nasal washings and aspirates are unacceptable for Xpert Xpress SARS-CoV-2/FLU/RSV testing.  Fact Sheet for Patients: EntrepreneurPulse.com.au  Fact Sheet for Healthcare Providers: IncredibleEmployment.be  This test is not yet approved or cleared by the [place] FDA and has been authorized for detection and/or diagnosis of SARS-CoV-2 by FDA under an Emergency Use Authorization (EUA). This EUA will remain in effect (meaning this test can be used) for the duration of the COVID-19 declaration under Section 564(b)(1) of the Act, 21 U.S.C. section 360bbb-3(b)(1), unless the authorization is terminated or revoked.  Performed at [facility], [place] [address] [place], [place] [address]   Surgical pcr screen     Status: None   Collection Time: 08/04/21  9:40 PM   Specimen: Nasal Mucosa; Nasal Swab  Result Value Ref Range Status   MRSA, PCR NEGATIVE NEGATIVE Final   Staphylococcus aureus NEGATIVE NEGATIVE Final    Comment: (NOTE) The Xpert SA Assay (FDA approved for NASAL specimens in patients 73 years of age and older), is one component of a comprehensive surveillance program. It is not intended to diagnose infection nor to guide or monitor treatment. Performed at [facility], [place] [address], [place],  [address]           Radiology Studies: No results found.      Scheduled Meds:  enoxaparin (LOVENOX) injection  30 mg Subcutaneous Q24H   furosemide  20 mg Oral Daily   insulin aspart  0-5 Units Subcutaneous QHS   insulin aspart  0-6 Units Subcutaneous TID WC   levothyroxine  50 mcg Oral QAC breakfast   lidocaine  1 patch Transdermal Q24H   metoprolol tartrate  12.5 mg Oral BID   multivitamin with minerals  1 tablet Oral Daily   pantoprazole (PROTONIX) IV  40 mg Intravenous Q12H   tamsulosin  0.4 mg Oral Daily   Continuous  Infusions:  lactated ringers 50 mL/hr at 08/07/21 1343   methocarbamol (ROBAXIN) IV       LOS: 4 days    Time spent: 35 minutes    Damauri Minion A Jaxston Chohan, MD [facility]   If 7PM-7AM, please contact night-coverage [email or web address]  08/07/2021, 2:32 PM

## 2021-08-07 NOTE — Progress Notes (Signed)
Physical Therapy Treatment Patient Details Name: Nathan Kim MRN: [id number] DOB: Feb 20, 1920 Today's Date: 08/07/2021   History of Present Illness Pt adm 10/30 with abdominal pain, N/V, and weight loss. Pt thought to have partial SBO. 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 PMH - back surgery, rt THR, ckd, arthritis, afib    PT Comments    Patient progressing well with mobility. Requires Mod A to stand from chair and Min A for gait training with use of RW for support. Cues for RW management especially with turns. Pt did not move well this morning after being given dilaudid with OT; does better with PO meds. Reviewed abdominal precautions for comfort. Continue to recommend OOB to chair for all meals and walking with mobility techs daily. Will follow.   Recommendations for follow up therapy are one component of a multi-disciplinary discharge planning process, led by the attending physician.  Recommendations may be updated based on patient status, additional functional criteria and insurance authorization.  Follow Up Recommendations  Home health PT     Assistance Recommended at Discharge Intermittent Supervision/Assistance  Equipment Recommendations  None recommended by PT    Recommendations for Other Services       Precautions / Restrictions Precautions Precautions: Fall Precaution Comments: abdominal precautions for comfort Restrictions Weight Bearing Restrictions: No     Mobility  Bed Mobility Overal bed mobility: Needs Assistance Bed Mobility: Sit to Supine       Sit to supine: HOB elevated;Min guard   General bed mobility comments: Despite cues for log roll technique, pt bringing LEs into bed going into long sitting.    Transfers Overall transfer level: Needs assistance Equipment used: Rolling walker (2 wheels) Transfers: Sit to/from Stand Sit to Stand: Mod assist           General  transfer comment: Assist to power to standing from chair with cues for hand placement. Difficulty transitioning hands from arm rests to RW. Able to bring feet under CoM with support.    Ambulation/Gait Ambulation/Gait assistance: Min assist Gait Distance (Feet): 200 Feet Assistive device: Rolling walker (2 wheels) Gait Pattern/deviations: Step-through pattern;Decreased stride length;Trunk flexed Gait velocity: decr   General Gait Details: Slow, mildly unsteady gait, difficulty with turns needing cues to stay within RW. 2 standing rest breaks.   Stairs             Wheelchair Mobility    Modified Rankin (Stroke Patients Only)       Balance Overall balance assessment: Needs assistance Sitting-balance support: Feet supported;No upper extremity supported Sitting balance-Leahy Scale: Fair     Standing balance support: During functional activity Standing balance-Leahy Scale: Poor Standing balance comment: reliant on UE support                            Cognition Arousal/Alertness: Awake/alert Behavior During Therapy: WFL for tasks assessed/performed Overall Cognitive Status: Within Functional Limits for tasks assessed                                 General Comments: HOH so needs repetition at times. Pleasant.        Exercises      General Comments General comments (skin integrity, edema, etc.): Daughter present during session. Continued swelling in bil hands, elevated post session.      Pertinent Vitals/Pain Pain  Assessment: Faces Faces Pain Scale: Hurts even more Pain Location: abdomen with mobility Pain Descriptors / Indicators: Guarding;Aching Pain Intervention(s): Monitored during session;Repositioned;Premedicated before session    Home Living                          Prior Function            PT Goals (current goals can now be found in the care plan section) Progress towards PT goals: Progressing toward  goals    Frequency    Min 3X/week      PT Plan Current plan remains appropriate    Co-evaluation              AM-PAC PT "6 Clicks" Mobility   Outcome Measure  Help needed turning from your back to your side while in a flat bed without using bedrails?: A Little Help needed moving from lying on your back to sitting on the side of a flat bed without using bedrails?: A Little Help needed moving to and from a bed to a chair (including a wheelchair)?: A Little Help needed standing up from a chair using your arms (e.g., wheelchair or bedside chair)?: A Lot Help needed to walk in hospital room?: A Little Help needed climbing 3-5 steps with a railing? : A Little 6 Click Score: 17    End of Session Equipment Utilized During Treatment: Gait belt Activity Tolerance: Patient tolerated treatment well Patient left: in bed;with call bell/phone within reach;with bed alarm set;with SCD's reapplied;with family/visitor present Nurse Communication: Mobility status PT Visit Diagnosis: Other abnormalities of gait and mobility (R26.89);Muscle weakness (generalized) (M62.81);Pain Pain - part of body:  (abdomen)     Time: 6720-9470 PT Time Calculation (min) (ACUTE ONLY): 23 min  Charges:  $Gait Training: 8-22 mins $Therapeutic Activity: 8-22 mins                     Marisa Severin, PT, DPT Acute Rehabilitation Services Pager [phone] Office [place] 08/07/2021, 2:28 PM

## 2021-08-07 NOTE — Progress Notes (Signed)
Mobility Specialist Progress Note:   08/07/21 1400  Mobility  Activity Refused mobility  $Mobility charge 1 Mobility   Pt refused d/t pain and fatigue from ambulating earlier.  Nelta Numbers Mobility Specialist  Phone [phone]

## 2021-08-07 NOTE — Evaluation (Signed)
Occupational Therapy Evaluation Patient Details Name: Nathan Kim MRN: [id number] DOB: 14-Oct-1919 Today's Date: 08/07/2021   History of Present Illness Pt adm 10/30 with abdominal pain, N/V, and weight loss. Pt thought to have partial SBO. 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 PMH - back surgery, rt THR, ckd, arthritis, afib   Clinical Impression   PTA, pt lives with spouse and typically Modified Independent with ADLs, IADLs and mobility using quad cane. Pt presents now with deficits in pain, standing balance, endurance and overall strength. Due to pain limitations with OT attempt yesterday, coordinated for premedication this AM. However, pt with increased difficulty gaining balance and mobilizing today in comparison to 240ft ambulated with PT yesterday. Pt currently required Mod A for basic pivots using RW due to posterior lean, Min A for UB ADLs and Max A for LB ADLs. Pt's daughter present and hands on to assist pt as well - confirms she can assist pt at DC as well. Currently will recommend HHOT in anticipation of progress towards OT goals and family support at home. However, if pt slow to progress, may need to consider postacute rehab.      Recommendations for follow up therapy are one component of a multi-disciplinary discharge planning process, led by the attending physician.  Recommendations may be updated based on patient status, additional functional criteria and insurance authorization.   Follow Up Recommendations  Home health OT    Assistance Recommended at Discharge Frequent or constant Supervision/Assistance  Functional Status Assessment  Patient has had a recent decline in their functional status and demonstrates the ability to make significant improvements in function in a reasonable and predictable amount of time.  Equipment Recommendations  [facility]    Recommendations for Other Services        Precautions / Restrictions Precautions Precautions: Fall Precaution Comments: abdominal precautions for comfort Restrictions Weight Bearing Restrictions: No      Mobility Bed Mobility Overal bed mobility: Needs Assistance Bed Mobility: Rolling;Sidelying to Sit Rolling: Min assist Sidelying to sit: Min assist       General bed mobility comments: Min A to roll to side and lift trunk with increased time, use of bedrails    Transfers Overall transfer level: Needs assistance Equipment used: Rolling walker (2 wheels) Transfers: Sit to/from [facility] Sit to Stand: Mod assist Stand pivot transfers: Mod assist;+2 safety/equipment         General transfer comment: Mod A for sit to stand at bedside using RW, required assist to move R UE from bedrail to RW with noted posterior lean and difficulty shifting weight over feet. Guided pt in Mod A pivot to recliner using RW with daughter assisting for safety. Pt able to assist moving RW though posterior lean still present      Balance Overall balance assessment: Needs assistance Sitting-balance support: No upper extremity supported;Feet supported Sitting balance-Leahy Scale: Fair     Standing balance support: Bilateral upper extremity supported;Reliant on assistive device for balance Standing balance-Leahy Scale: Poor Standing balance comment: reliant on UE support + external assist in standing                           ADL either performed or assessed with clinical judgement   ADL Overall ADL's : Needs assistance/impaired Eating/Feeding: Set up;Sitting   Grooming: Set up;Sitting   Upper Body Bathing: Minimal assistance;Sitting   Lower Body Bathing: Maximal  assistance;Sit to/from stand   Upper Body Dressing : Minimal assistance;Sitting   Lower Body Dressing: Maximal assistance;Sit to/from stand   Toilet Transfer: Moderate assistance;Stand-pivot;BSC;Rolling walker (2 wheels) Toilet Transfer  Details (indicate cue type and reason): simulated to recliner Toileting- Clothing Manipulation and Hygiene: Maximal assistance;Sit to/from stand         General ADL Comments: Pt previously limited by pain - coordinated with IV pain meds prior to session with lethargy, difficulty gaining balance with posterior lean requiring extensive assist for LB ADLs     Vision Baseline Vision/History: 1 Wears glasses Ability to See in Adequate Light: 0 Adequate Patient Visual Report: No change from baseline Vision Assessment?: No apparent visual deficits     Perception     Praxis      Pertinent Vitals/Pain Pain Assessment: Faces Faces Pain Scale: Hurts even more Pain Location: abdomen with bed mobility Pain Descriptors / Indicators: Grimacing;Guarding Pain Intervention(s): Monitored during session;Premedicated before session;Limited activity within patient's tolerance;Repositioned     Hand Dominance Right   Extremity/Trunk Assessment Upper Extremity Assessment Upper Extremity Assessment: Generalized weakness   Lower Extremity Assessment Lower Extremity Assessment: Defer to PT evaluation   Cervical / Trunk Assessment Cervical / Trunk Assessment: Kyphotic   Communication Communication Communication: HOH   Cognition Arousal/Alertness: Awake/alert Behavior During Therapy: WFL for tasks assessed/performed Overall Cognitive Status: Within Functional Limits for tasks assessed                                 General Comments: pleasant, participatory, follows directions - HOH     General Comments  Daughter present and hands on to assist pt. Pt with increased swelling in B hands (RN aware) - encouraged AROM of B hands and elevation to combat swelling    Exercises     Shoulder Instructions      Home Living Family/patient expects to be discharged to:: Private residence Living Arrangements: Spouse/significant other Available Help at Discharge: Family;Available 24  hours/day Type of Home: House Home Access: Stairs to enter [employer] of Steps: 3 Entrance Stairs-Rails: Right;Left;Can reach both Home Layout: One level     Bathroom Shower/Tub: Teacher, early years/pre: Standard     Home Equipment: Cane - Holiday representative (2 wheels);Rollator (4 wheels);Shower seat;Hand held shower head          Prior Functioning/Environment Prior Level of Function : Independent/Modified Independent             Mobility Comments: Uses quad cane, endorses hx of falls ADLs Comments: Typically able to complete ADLs without assist (uses shower chair), active at baseline - enjoys gardening        OT Problem List: Decreased strength;Decreased activity tolerance;Impaired balance (sitting and/or standing);Pain;Decreased safety awareness;Decreased knowledge of use of DME or AE      OT Treatment/Interventions: Self-care/ADL training;Therapeutic exercise;Energy conservation;DME and/or AE instruction;Therapeutic activities;Patient/family education;Balance training    OT Goals(Current goals can be found in the care plan section) Acute Rehab OT Goals Patient Stated Goal: decrease pain, be able to return to independence OT Goal Formulation: With patient Time For Goal Achievement: 08/21/21 Potential to Achieve Goals: Good  OT Frequency: Min 2X/week   Barriers to D/C:            Co-evaluation              AM-PAC OT "6 Clicks" Daily Activity     Outcome Measure Help from another person eating  meals?: A Little Help from another person taking care of personal grooming?: A Little Help from another person toileting, which includes using toliet, bedpan, or urinal?: A Lot Help from another person bathing (including washing, rinsing, drying)?: A Lot Help from another person to put on and taking off regular upper body clothing?: A Little Help from another person to put on and taking off regular lower body clothing?: A Lot 6 Click Score:  15   End of Session Equipment Utilized During Treatment: Gait belt;Rolling walker (2 wheels) Nurse Communication: Mobility status  Activity Tolerance: Patient tolerated treatment well Patient left: in chair;with call bell/phone within reach;with chair alarm set;with SCD's reapplied  OT Visit Diagnosis: Unsteadiness on feet (R26.81);Other abnormalities of gait and mobility (R26.89);Muscle weakness (generalized) (M62.81);Pain Pain - part of body:  (abdomen)                Time: 2595-6387 OT Time Calculation (min): 32 min Charges:  OT General Charges $OT Visit: 1 Visit OT Evaluation $OT Eval Moderate Complexity: 1 Mod OT Treatments $Therapeutic Activity: 8-22 mins  Malachy Chamber, OTR/L Acute Rehab Services Office: [phone]   Layla Maw 08/07/2021, 10:27 AM

## 2021-08-07 NOTE — Progress Notes (Signed)
2 Days Post-Op   Subjective/Chief Complaint: Pt sore on liquids no bowel function no N/V hands swollen needs his lasix    Objective: Vital signs in last 24 hours: Temp:  [97.4 F (36.3 C)-98.4 F (36.9 C)] 98 F (36.7 C) (11/04 0805) Pulse Rate:  [69-79] 79 (11/04 0805) Resp:  [14-19] 19 (11/04 0805) BP: (109-119)/(52-64) 119/60 (11/04 0805) SpO2:  [96 %-100 %] 100 % (11/04 0805) Weight:  [64.6 kg] 64.6 kg (11/04 0515) Last BM Date: 08/04/21  Intake/Output from previous day: 11/03 0701 - 11/04 0700 In: 1911.9 [P.O.:360; I.V.:1451.9; IV Piggyback:100] Out: 550 [Urine:450; Emesis/NG output:100] Intake/Output this shift: Total I/O In: 60 [P.O.:60] Out: 150 [Urine:150]   Gen:  Alert, NAD, pleasant, NG in L nare  Cardio: RRR Pulm: rate and effort normal on room air Abd: Soft, appropriately tender, incisions c/d/I, +BS Psych: Nathan&Ox3 Ext:  hands swollen Lab Results:  Recent Labs    08/06/21 0033 08/07/21 0922  WBC 11.7* 13.4*  HGB 11.3* 12.2*  HCT 37.1* 39.8  PLT 165 208   BMET Recent Labs    08/05/21 0114 08/06/21 0033  NA 140 140  K 4.1 4.6  CL 114* 112*  CO2 20* 22  GLUCOSE 153* 248*  BUN 40* 35*  CREATININE 1.74* 1.77*  CALCIUM 8.3* 8.2*   PT/INR No results for input(s): LABPROT, INR in the last 72 hours. ABG No results for input(s): PHART, HCO3 in the last 72 hours.  Invalid input(s): PCO2, PO2  Studies/Results: No results found.  Anti-infectives: Anti-infectives (From admission, onward)    Start     Dose/Rate Route Frequency Ordered Stop   08/05/21 0921  sodium chloride 0.9 % with cefoTEtan (CEFOTAN) ADS Med       Note to Pharmacy: Gregery Na   : cabinet override      08/05/21 0921 08/05/21 1129   08/04/21 1100  cefoTEtan (CEFOTAN) 2 g in sodium chloride 0.9 % 100 mL IVPB  Status:  Discontinued        2 g 200 mL/hr over 30 Minutes Intravenous On call to O.R. 08/04/21 1001 08/05/21 0559       Assessment/Plan: s/p  Procedure(s): LAPAROSCOPIC ASSISTED RIGHT COLON RESECTION (N/Nathan) EXPLORATORY LAPAROTOMY (N/Nathan)   Bowel obstruction secondary to cecal mass  POD#2 S/P LAPAROSCOPIC ASSISTED RIGHT HEMICOLECTOMY 08/06/21 Dr. Brantley Stage - afebrile, VSS,- await surgical pathology - await return of bowel function - IS and pulm toilet  - PT/OT    ID - none FEN - clears add back lasix 20 mg po daily  Check BMET  VTE - SCDs, lovenox Foley - D/C today POD#1   HTN PAF not on anticoagulation CHF CKD-IV Hypothyroidism First degree heart block BPH Code status DNR   LOS: 4 days    Nathan Kim Nathan Duayne Brideau md  08/07/2021

## 2021-08-08 DIAGNOSIS — K56609 Unspecified intestinal obstruction, unspecified as to partial versus complete obstruction: Secondary | ICD-10-CM | POA: Diagnosis not present

## 2021-08-08 LAB — BASIC METABOLIC PANEL
Anion gap: 7 (ref 5–15)
BUN: 31 mg/dL — ABNORMAL HIGH (ref 8–23)
CO2: 23 mmol/L (ref 22–32)
Calcium: 7.8 mg/dL — ABNORMAL LOW (ref 8.9–10.3)
Chloride: 108 mmol/L (ref 98–111)
Creatinine, Ser: 1.97 mg/dL — ABNORMAL HIGH (ref 0.61–1.24)
GFR, Estimated: 30 mL/min — ABNORMAL LOW (ref >60)
Glucose, Bld: 105 mg/dL — ABNORMAL HIGH (ref 70–99)
Potassium: 4.5 mmol/L (ref 3.5–5.1)
Sodium: 138 mmol/L (ref 135–145)

## 2021-08-08 LAB — CBC
HCT: 33.6 % — ABNORMAL LOW (ref 39.0–52.0)
Hemoglobin: 10.6 g/dL — ABNORMAL LOW (ref 13.0–17.0)
MCH: 28.9 pg (ref 26.0–34.0)
MCHC: 31.5 g/dL (ref 30.0–36.0)
MCV: 91.6 fL (ref 80.0–100.0)
Platelets: 159 10*3/uL (ref 150–400)
RBC: 3.67 MIL/uL — ABNORMAL LOW (ref 4.22–5.81)
RDW: 14.7 % (ref 11.5–15.5)
WBC: 9.2 10*3/uL (ref 4.0–10.5)
nRBC: 0 % (ref 0.0–0.2)

## 2021-08-08 LAB — GLUCOSE, CAPILLARY
Glucose-Capillary: 107 mg/dL — ABNORMAL HIGH (ref 70–99)
Glucose-Capillary: 128 mg/dL — ABNORMAL HIGH (ref 70–99)
Glucose-Capillary: 150 mg/dL — ABNORMAL HIGH (ref 70–99)
Glucose-Capillary: 164 mg/dL — ABNORMAL HIGH (ref 70–99)

## 2021-08-08 MED ORDER — ACETAMINOPHEN 500 MG PO TABS
500.0000 mg | ORAL_TABLET | Freq: Two times a day (BID) | ORAL | Status: DC
  Administered 2021-08-08 – 2021-08-12 (×8): 500 mg via ORAL
  Filled 2021-08-08 (×9): qty 1

## 2021-08-08 MED ORDER — ENSURE ENLIVE PO LIQD
237.0000 mL | Freq: Two times a day (BID) | ORAL | Status: DC
  Administered 2021-08-08 – 2021-08-11 (×7): 237 mL via ORAL
  Filled 2021-08-08: qty 237

## 2021-08-08 MED ORDER — FUROSEMIDE 20 MG PO TABS
20.0000 mg | ORAL_TABLET | Freq: Every day | ORAL | Status: DC
  Administered 2021-08-08 – 2021-08-12 (×5): 20 mg via ORAL
  Filled 2021-08-08 (×5): qty 1

## 2021-08-08 NOTE — Progress Notes (Signed)
Mobility Specialist Progress Note   08/08/21 1625  Mobility  Activity Transferred:  Chair to bed  Level of Assistance Minimal assist, patient does 75% or more  Assistive Device Front wheel walker  Distance Ambulated (ft) 8 ft  Mobility Ambulated with assistance in room  Mobility Response Tolerated well  Mobility performed by Mobility specialist  $Mobility charge 1 Mobility   Pt's daughter requesting for pt to be moved back to bed d/t them continuously sliding down chair and being very uncomfortable after an hour. MinA for STS but contact guard throughout transfer, bed mobility is mod independent. Pt left w/ call bell by side all needs met and daughter in the room.    Holland Falling Mobility Specialist Phone Number [phone]

## 2021-08-08 NOTE — Progress Notes (Signed)
PROGRESS NOTE    Nathan Kim  MBW:[id number] DOB: Jul 10, 1920 DOA: 08/02/2021 PCP: [name], MD   Brief Narrative: 87 year old with past medical history significant for hypertension, PAF, diastolic heart failure, CKD stage IV, normocytic anemia, hypothyroidism, history of first-degree AV block, presents to the ED complaining of abdominal pain, poor appetite, nausea vomiting and weight loss.  He was discharged home September was 5 for 2 weeks with normal appetite, he will have episode of intermittent abdominal pain and not able to eat.  He presented to the ED and he was found to have a small bowel obstruction.  General surgery was consulted.  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 Dilaudid to 0.5 to 1 mg as needed every 3 hours for pain Continue with IV Protonix twice daily Continue with  IV fluids, rate decreased.  Await pathology  NG tube removed. Plan to advance diet to full liquid.  Schedule tylenol for pain management.   2-PAF: Resume home dose metoprolol/.  Resume aspirin when ok by Sx.   CKD stage IIIb: Monitor renal function Continue range 1.5---1.7 Cr stable today. 1.9 Continue to monitor on oral lasix.   Chronic diastolic heart failure: euvolemic continue to hold diuretic.  Monitor and IV fluids  Hypertension: on metoprolol.  Hypothyroidism: on Synthroid BPH ; Flomax Hands edema; suspect related  to hypoalbuminemia. Start ensure.     Nutrition Problem: Severe Malnutrition Etiology: chronic illness (CHF, CKD IV)    Signs/Symptoms: severe muscle depletion, severe fat depletion    Interventions: MVI, Boost Breeze, Prostat  Estimated body mass index is 19.83 kg/m as calculated from the following:   Height as of this encounter: 5\' 11"  (1.803 m).   Weight as of this encounter: 64.5 kg.   DVT prophylaxis: Lovenox Code Status: DNR Family Communication: Daughter at bedside 11/05 Disposition Plan:  Status is: Inpatient  Remains inpatient appropriate because: Patient admitted with SBO found to have cecum cancer        Consultants:  Surgery   Procedures:  Laparoscopic-assisted right hemicolectomy  Antimicrobials:    Subjective: He report abdominal pain, with movement.  Passing gas. Tolerating clears.  Hands swelling improved.   Objective: Vitals:   08/07/21 2107 08/08/21 0355 08/08/21 0404 08/08/21 0729  BP: 124/64  115/63 (!) 102/59  Pulse: 90  91 75  Resp: 19  19   Temp: 98.1 F (36.7 C)  98.4 F (36.9 C) 98.8 F (37.1 C)  TempSrc: Oral  Oral Oral  SpO2: 98%  92% 94%  Weight:  64.5 kg    Height:        Intake/Output Summary (Last 24 hours) at 08/08/2021 1106 Last data filed at 08/08/2021 0557 Gross per 24 hour  Intake 2019.39 ml  Output 1300 ml  Net 719.39 ml    Filed Weights   08/05/21 0906 08/07/21 0515 08/08/21 0355  Weight: 63.2 kg 64.6 kg 64.5 kg    Examination:  General exam: NAD Respiratory system: CTA Cardiovascular system: S  1, S 2 RRR Gastrointestinal system: BS decreases, soft, mild tender. ,  incision with dressing Central nervous system: Alert, follows command Extremities: trace edema    Data Reviewed: I have personally reviewed following labs and imaging studies  CBC: Recent Labs  Lab 08/03/21 0125 08/05/21 0114 08/06/21 0033 08/07/21 0922 08/08/21 0852  WBC 3.6* 5.5 11.7* 13.4* 9.2  HGB 10.4* 10.8* 11.3*  12.2* 10.6*  HCT 33.6* 35.1* 37.1* 39.8 33.6*  MCV 90.6 91.2 91.6 92.3 91.6  PLT 162 170 165 208 956    Basic Metabolic Panel: Recent Labs  Lab 08/02/21 0636 08/03/21 0125 08/04/21 0100 08/05/21 0114 08/06/21 0033 08/07/21 0922 08/08/21 0852  NA 140   < > 144 140 140 138 138  K 3.7   < > 3.8 4.1 4.6 4.4 4.5  CL 107   < > 117* 114* 112* 110 108  CO2 25   < > 20* 20* 22 22 23   GLUCOSE 135*   < > 75 153* 248* 118* 105*  BUN 61*   < > 45* 40* 35* 34* 31*  CREATININE 2.13*   < > 1.73* 1.74* 1.77* 1.97* 1.97*  CALCIUM 8.8*   < > 8.2* 8.3* 8.2* 8.3* 7.8*  MG 2.3  --   --  2.3 2.2 2.3  --    < > = values in this interval not displayed.    GFR: Estimated Creatinine Clearance: 18.6 mL/min (A) (by C-G formula based on SCr of 1.97 mg/dL (H)). Liver Function Tests: Recent Labs  Lab 08/02/21 0636 08/06/21 0033  AST 16 15  ALT 7 8  ALKPHOS 69 61  BILITOT 0.7 0.5  PROT 5.8* 5.0*  ALBUMIN 3.3* 2.7*    Recent Labs  Lab 08/02/21 0636  LIPASE 26    No results for input(s): AMMONIA in the last 168 hours. Coagulation Profile: No results for input(s): INR, PROTIME in the last 168 hours. Cardiac Enzymes: No results for input(s): CKTOTAL, CKMB, CKMBINDEX, TROPONINI in the last 168 hours. BNP (last 3 results) No results for input(s): PROBNP in the last 8760 hours. HbA1C: No results for input(s): HGBA1C in the last 72 hours.  CBG: Recent Labs  Lab 08/07/21 0759 08/07/21 1143 08/07/21 1545 08/07/21 2053 08/08/21 0743  GLUCAP 112* 101* 118* 116* 107*    Lipid Profile: No results for input(s): CHOL, HDL, LDLCALC, TRIG, CHOLHDL, LDLDIRECT in the last 72 hours. Thyroid Function Tests: No results for input(s): TSH, T4TOTAL, FREET4, T3FREE, THYROIDAB in the last 72 hours. Anemia Panel: No results for input(s): VITAMINB12, FOLATE, FERRITIN, TIBC, IRON, RETICCTPCT in the last 72 hours. Sepsis Labs: Recent Labs  Lab 08/02/21 1300 08/02/21 1907  LATICACIDVEN 1.1 0.9      Recent Results (from the past 240 hour(s))  Resp Panel by RT-PCR (Flu A&B, Covid) Nasopharyngeal Swab     Status: None   Collection Time: 08/02/21 12:16 PM   Specimen: Nasopharyngeal Swab; Nasopharyngeal(NP) swabs in vial transport medium  Result Value Ref Range Status   SARS Coronavirus 2 by RT PCR NEGATIVE NEGATIVE Final    Comment: (NOTE) SARS-CoV-2 target nucleic acids are NOT DETECTED.  The SARS-CoV-2 RNA is generally detectable in upper respiratory specimens during the acute phase of infection. The lowest concentration of SARS-CoV-2 viral copies this assay can detect is 138 copies/mL. A negative result does not preclude SARS-Cov-2 infection and should not be used as the sole basis for treatment or other patient management decisions. A negative result may occur with  improper specimen  collection/handling, submission of specimen other than nasopharyngeal swab, presence of viral mutation(s) within the areas targeted by this assay, and inadequate number of viral copies(<138 copies/mL). A negative result must be combined with clinical observations, patient history, and epidemiological information. The expected result is Negative.  Fact Sheet for Patients:  EntrepreneurPulse.com.au  Fact Sheet for Healthcare Providers:  IncredibleEmployment.be  This test is no t yet approved or cleared by the [place] FDA and  has been authorized for detection and/or diagnosis of SARS-CoV-2 by FDA under an Emergency Use Authorization (EUA). This EUA will remain  in effect (meaning this test can be used) for the duration of the COVID-19 declaration under Section 564(b)(1) of the Act, 21 U.S.C.section 360bbb-3(b)(1), unless the authorization is terminated  or revoked sooner.       Influenza A by PCR NEGATIVE NEGATIVE Final   Influenza B by PCR NEGATIVE NEGATIVE Final    Comment: (NOTE) The Xpert Xpress SARS-CoV-2/FLU/RSV plus assay is intended as  an aid in the diagnosis of influenza from Nasopharyngeal swab specimens and should not be used as a sole basis for treatment. Nasal washings and aspirates are unacceptable for Xpert Xpress SARS-CoV-2/FLU/RSV testing.  Fact Sheet for Patients: EntrepreneurPulse.com.au  Fact Sheet for Healthcare Providers: IncredibleEmployment.be  This test is not yet approved or cleared by the [place] FDA and has been authorized for detection and/or diagnosis of SARS-CoV-2 by FDA under an Emergency Use Authorization (EUA). This EUA will remain in effect (meaning this test can be used) for the duration of the COVID-19 declaration under Section 564(b)(1) of the Act, 21 U.S.C. section 360bbb-3(b)(1), unless the authorization is terminated or revoked.  Performed at [facility], [place] [address], [place], [place] [address]   Surgical pcr screen     Status: None   Collection Time: 08/04/21  9:40 PM   Specimen: Nasal Mucosa; Nasal Swab  Result Value Ref Range Status   MRSA, PCR NEGATIVE NEGATIVE Final   Staphylococcus aureus NEGATIVE NEGATIVE Final    Comment: (NOTE) The Xpert SA Assay (FDA approved for NASAL specimens in patients 19 years of age and older), is one component of a comprehensive surveillance program. It is not intended to diagnose infection nor to guide or monitor treatment. Performed at [facility], [place] [address], [place], [place] [address]           Radiology Studies: No results found.      Scheduled Meds:  acetaminophen  500 mg Oral BID   enoxaparin (LOVENOX) injection  30 mg Subcutaneous Q24H   feeding supplement  237 mL Oral BID BM   furosemide  20 mg Oral Daily   insulin aspart  0-5 Units Subcutaneous QHS   insulin aspart  0-6 Units Subcutaneous TID WC   levothyroxine  50 mcg Oral QAC breakfast   lidocaine  1 patch Transdermal Q24H   metoprolol tartrate  12.5 mg Oral BID   multivitamin with minerals  1  tablet Oral Daily   pantoprazole (PROTONIX) IV  40 mg Intravenous Q12H   tamsulosin  0.4 mg Oral Daily   Continuous Infusions:  lactated ringers 50 mL/hr at 08/08/21 9381   methocarbamol (ROBAXIN) IV       LOS: 5 days    Time spent: 35 minutes    Heer Justiss A Quamere Mussell, MD [facility]   If 7PM-7AM, please contact night-coverage [email or web address]  08/08/2021, 11:06 AM

## 2021-08-08 NOTE — Progress Notes (Signed)
Patient ID: Nathan Kim, male   DOB: 1919-11-01, 87 y.o.   MRN: [id number] [facility] Surgery Progress Note:   3 Days Post-Op  Subjective: Mental status is clear.  Complaints none but hungry. Objective: Vital signs in last 24 hours: Temp:  [98.1 F (36.7 C)-98.8 F (37.1 C)] 98.8 F (37.1 C) (11/05 0729) Pulse Rate:  [75-91] 75 (11/05 0729) Resp:  [19] 19 (11/05 0404) BP: (102-124)/(59-73) 102/59 (11/05 0729) SpO2:  [92 %-98 %] 94 % (11/05 0729) Weight:  [64.5 kg] 64.5 kg (11/05 0355)  Intake/Output from previous day: 11/04 0701 - 11/05 0700 In: 2079.4 [P.O.:760; I.V.:1319.4] Out: 1400 [Urine:1400] Intake/Output this shift: No intake/output data recorded.  Physical Exam: Work of breathing is not labored.  Abdomen soft and flat;  incisions bland  Lab Results:  Results for orders placed or performed during the hospital encounter of 08/02/21 (from the past 48 hour(s))  Glucose, capillary     Status: Abnormal   Collection Time: 08/06/21  8:40 AM  Result Value Ref Range   Glucose-Capillary 183 (H) 70 - 99 mg/dL    Comment: Glucose reference range applies only to samples taken after fasting for at least 8 hours.  Glucose, capillary     Status: Abnormal   Collection Time: 08/06/21 11:41 AM  Result Value Ref Range   Glucose-Capillary 191 (H) 70 - 99 mg/dL    Comment: Glucose reference range applies only to samples taken after fasting for at least 8 hours.  Glucose, capillary     Status: Abnormal   Collection Time: 08/06/21  5:11 PM  Result Value Ref Range   Glucose-Capillary 113 (H) 70 - 99 mg/dL    Comment: Glucose reference range applies only to samples taken after fasting for at least 8 hours.  Glucose, capillary     Status: Abnormal   Collection Time: 08/06/21 10:32 PM  Result Value Ref Range   Glucose-Capillary 131 (H) 70 - 99 mg/dL    Comment: Glucose reference range applies only to samples taken after fasting for at least 8 hours.  Glucose, capillary     Status:  Abnormal   Collection Time: 08/07/21  7:59 AM  Result Value Ref Range   Glucose-Capillary 112 (H) 70 - 99 mg/dL    Comment: Glucose reference range applies only to samples taken after fasting for at least 8 hours.  CBC     Status: Abnormal   Collection Time: 08/07/21  9:22 AM  Result Value Ref Range   WBC 13.4 (H) 4.0 - 10.5 K/uL   RBC 4.31 4.22 - 5.81 MIL/uL   Hemoglobin 12.2 (L) 13.0 - 17.0 g/dL   HCT 39.8 39.0 - 52.0 %   MCV 92.3 80.0 - 100.0 fL   MCH 28.3 26.0 - 34.0 pg   MCHC 30.7 30.0 - 36.0 g/dL   RDW 14.7 11.5 - 15.5 %   Platelets 208 150 - 400 K/uL   nRBC 0.0 0.0 - 0.2 %    Comment: Performed at [facility], [place] [address] [place], [place] [address]  Basic metabolic panel     Status: Abnormal   Collection Time: 08/07/21  9:22 AM  Result Value Ref Range   Sodium 138 135 - 145 mmol/L   Potassium 4.4 3.5 - 5.1 mmol/L   Chloride 110 98 - 111 mmol/L   CO2 22 22 - 32 mmol/L   Glucose, Bld 118 (H) 70 - 99 mg/dL    Comment: Glucose reference range applies only to samples  taken after fasting for at least 8 hours.   BUN 34 (H) 8 - 23 mg/dL   Creatinine, Ser 1.97 (H) 0.61 - 1.24 mg/dL   Calcium 8.3 (L) 8.9 - 10.3 mg/dL   GFR, Estimated 30 (L) >60 mL/min    Comment: (NOTE) Calculated using the CKD-EPI Creatinine Equation (2021)    Anion gap 6 5 - 15    Comment: Performed at [facility] [address], [place], [place] [address]  Magnesium     Status: None   Collection Time: 08/07/21  9:22 AM  Result Value Ref Range   Magnesium 2.3 1.7 - 2.4 mg/dL    Comment: Performed at [facility] [address], [place], [place] [address]  Glucose, capillary     Status: Abnormal   Collection Time: 08/07/21 11:43 AM  Result Value Ref Range   Glucose-Capillary 101 (H) 70 - 99 mg/dL    Comment: Glucose reference range applies only to samples taken after fasting for at least 8 hours.  Glucose, capillary     Status: Abnormal   Collection Time: 08/07/21  3:45 PM   Result Value Ref Range   Glucose-Capillary 118 (H) 70 - 99 mg/dL    Comment: Glucose reference range applies only to samples taken after fasting for at least 8 hours.  Glucose, capillary     Status: Abnormal   Collection Time: 08/07/21  8:53 PM  Result Value Ref Range   Glucose-Capillary 116 (H) 70 - 99 mg/dL    Comment: Glucose reference range applies only to samples taken after fasting for at least 8 hours.  Glucose, capillary     Status: Abnormal   Collection Time: 08/08/21  7:43 AM  Result Value Ref Range   Glucose-Capillary 107 (H) 70 - 99 mg/dL    Comment: Glucose reference range applies only to samples taken after fasting for at least 8 hours.   Comment 1 Notify RN     Radiology/Results: No results found.  Anti-infectives: Anti-infectives (From admission, onward)    Start     Dose/Rate Route Frequency Ordered Stop   08/05/21 0921  sodium chloride 0.9 % with cefoTEtan (CEFOTAN) ADS Med       Note to Pharmacy: Gregery Na   : cabinet override      08/05/21 0921 08/05/21 1129   08/04/21 1100  cefoTEtan (CEFOTAN) 2 g in sodium chloride 0.9 % 100 mL IVPB  Status:  Discontinued        2 g 200 mL/hr over 30 Minutes Intravenous On call to O.R. 08/04/21 1001 08/05/21 0559       Assessment/Plan: Problem List: Patient Active Problem List   Diagnosis Date Noted   Protein-calorie malnutrition, severe 08/05/2021   SBO (small bowel obstruction) ([place]) 08/02/2021   Chronic diastolic CHF (congestive heart failure) ([place]) 08/02/2021   Hypothyroidism 06/06/2021   Ileus ([place]) 06/05/2021   CKD (chronic kidney disease), stage IV ([place]) 06/05/2021   Normocytic anemia 06/05/2021   Elevated troponin 06/05/2021   S/P right THA, AA 05/24/2017   Lumbar stenosis with neurogenic claudication 02/27/2016   PAF (paroxysmal atrial fibrillation) ([place]) 02/25/2016   Essential hypertension 02/25/2016   Preoperative clearance 02/25/2016   PVC's (premature ventricular contractions) 02/25/2016    First degree heart block 02/25/2016    Doing well.  Tolerating clear liquids.  Will advance to full liquids 3 Days Post-Op    LOS: 5 days   Matt B. Hassell Done, MD, [facility] [phone]  to reach the surgeon on call.    08/08/2021 8:32 AM

## 2021-08-09 DIAGNOSIS — K56609 Unspecified intestinal obstruction, unspecified as to partial versus complete obstruction: Secondary | ICD-10-CM | POA: Diagnosis not present

## 2021-08-09 LAB — CBC
HCT: 33.4 % — ABNORMAL LOW (ref 39.0–52.0)
Hemoglobin: 10.3 g/dL — ABNORMAL LOW (ref 13.0–17.0)
MCH: 28.4 pg (ref 26.0–34.0)
MCHC: 30.8 g/dL (ref 30.0–36.0)
MCV: 92 fL (ref 80.0–100.0)
Platelets: 148 10*3/uL — ABNORMAL LOW (ref 150–400)
RBC: 3.63 MIL/uL — ABNORMAL LOW (ref 4.22–5.81)
RDW: 14.6 % (ref 11.5–15.5)
WBC: 7.8 10*3/uL (ref 4.0–10.5)
nRBC: 0 % (ref 0.0–0.2)

## 2021-08-09 LAB — BASIC METABOLIC PANEL
Anion gap: 6 (ref 5–15)
BUN: 29 mg/dL — ABNORMAL HIGH (ref 8–23)
CO2: 22 mmol/L (ref 22–32)
Calcium: 7.9 mg/dL — ABNORMAL LOW (ref 8.9–10.3)
Chloride: 108 mmol/L (ref 98–111)
Creatinine, Ser: 1.81 mg/dL — ABNORMAL HIGH (ref 0.61–1.24)
GFR, Estimated: 33 mL/min — ABNORMAL LOW (ref >60)
Glucose, Bld: 147 mg/dL — ABNORMAL HIGH (ref 70–99)
Potassium: 3.7 mmol/L (ref 3.5–5.1)
Sodium: 136 mmol/L (ref 135–145)

## 2021-08-09 LAB — GLUCOSE, CAPILLARY
Glucose-Capillary: 137 mg/dL — ABNORMAL HIGH (ref 70–99)
Glucose-Capillary: 160 mg/dL — ABNORMAL HIGH (ref 70–99)
Glucose-Capillary: 166 mg/dL — ABNORMAL HIGH (ref 70–99)

## 2021-08-09 MED ORDER — ALBUTEROL SULFATE (2.5 MG/3ML) 0.083% IN NEBU
2.5000 mg | INHALATION_SOLUTION | Freq: Four times a day (QID) | RESPIRATORY_TRACT | Status: DC
  Administered 2021-08-09 (×2): 2.5 mg via RESPIRATORY_TRACT
  Filled 2021-08-09 (×2): qty 3

## 2021-08-09 MED ORDER — ALBUTEROL SULFATE (2.5 MG/3ML) 0.083% IN NEBU
2.5000 mg | INHALATION_SOLUTION | Freq: Three times a day (TID) | RESPIRATORY_TRACT | Status: DC
Start: 2021-08-10 — End: 2021-08-10
  Administered 2021-08-10 (×3): 2.5 mg via RESPIRATORY_TRACT
  Filled 2021-08-09 (×4): qty 3

## 2021-08-09 NOTE — Progress Notes (Signed)
PROGRESS NOTE    Nathan Kim  SJG:[id number] DOB: [birth year] DOA: 08/02/2021 PCP: [name], MD   Brief Narrative: 87 year old with past medical history significant for hypertension, PAF, diastolic heart failure, CKD stage IV, normocytic anemia, hypothyroidism, history of first-degree AV block, presents to the ED complaining of abdominal pain, poor appetite, nausea vomiting and weight loss.  He was discharged home September was 5 for 2 weeks with normal appetite, he will have episode of intermittent abdominal pain and not able to eat.  He presented to the ED and he was found to have a small bowel obstruction.  General surgery was consulted.  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 Dilaudid to 0.5 to 1 mg as needed every 3 hours for pain Continue with IV Protonix twice daily NSL fluid Await pathology  NG tube removed.  Schedule tylenol for pain management.  Started on soft diet. Passing gas.   2-PAF: Resume home dose metoprolol/.  Resume aspirin when ok by Sx.   CKD stage IIIb: Monitor renal function Continue range 1.5---1.7 Cr stable today. 1.8 Continue to monitor on oral lasix.   Chronic diastolic heart failure: euvolemic continue to hold diuretic.  Monitor and IV fluids  Hypertension: on metoprolol.  Hypothyroidism: on Synthroid BPH ; Flomax Hands edema; suspect related to hypoalbuminemia. Started   ensure.  Respiratory System:  BL ronchus, incentive spirometry. Nebulizer tx ordered.    Nutrition Problem: Severe Malnutrition Etiology: chronic illness (CHF, CKD IV)    Signs/Symptoms: severe muscle depletion, severe fat depletion    Interventions: MVI, Boost Breeze, Prostat  Estimated body mass index is 19.83 kg/m as calculated from the following:   Height as of this encounter: 5\' 11"  (1.803 m).   Weight as of this encounter: 64.5 kg.   DVT prophylaxis: Lovenox Code Status: DNR Family Communication: Daughter at bedside 11/06 Disposition Plan:  Status is: Inpatient  Remains inpatient appropriate because: Patient admitted with SBO found to have cecum cancer        Consultants:  Surgery   Procedures:  Laparoscopic-assisted right hemicolectomy  Antimicrobials:    Subjective: Some abdominal pain. Received pain meds.  Passing gas, diet advanced to soft.   Objective: Vitals:   08/08/21 1956 08/08/21 1956 08/09/21 0545 08/09/21 0921  BP: (!) 104/55 (!) 104/55 (!) 116/53 (!) 110/52  Pulse: 75 74 65 65  Resp:   17 18  Temp: 97.8 F (36.6 C) 97.8 F (36.6 C) (!) 97.5 F (36.4 C) 98.4 F (36.9 C)  TempSrc: Oral Oral Oral Oral  SpO2: 95% 95% 92% 90%  Weight:      Height:        Intake/Output Summary (Last 24 hours) at 08/09/2021 1415 Last data filed at 08/09/2021 6546 Gross per 24 hour  Intake 240 ml  Output 1150 ml  Net -910 ml    Filed Weights   08/05/21 0906 08/07/21 0515 08/08/21 0355  Weight: 63.2 kg 64.6 kg 64.5 kg    Examination:  General exam: NAD Respiratory system; BL ronchus Cardiovascular system: S 1, S 2 RRR Gastrointestinal system: BS decreased, soft, mil tenderness, incision with dressing Central nervous system: alert, interactive Extremities: Trace edema    Data Reviewed: I have personally reviewed following labs and imaging studies  CBC: Recent Labs  Lab 08/05/21 0114 08/06/21 0033 08/07/21 0922 08/08/21 0852  08/09/21 0715  WBC 5.5 11.7* 13.4* 9.2 7.8  HGB 10.8* 11.3* 12.2* 10.6* 10.3*  HCT 35.1* 37.1* 39.8 33.6* 33.4*  MCV 91.2 91.6 92.3 91.6 92.0  PLT 170 165 208 159 148*    Basic Metabolic Panel: Recent Labs  Lab 08/05/21 0114 08/06/21 0033 08/07/21 0922 08/08/21 0852 08/09/21 0715  NA 140 140 138 138 136  K 4.1 4.6 4.4 4.5 3.7  CL 114* 112* 110 108 108  CO2 20* 22 22 23 22   GLUCOSE 153* 248* 118* 105* 147*  BUN 40* 35* 34* 31* 29*  CREATININE 1.74* 1.77* 1.97* 1.97* 1.81*  CALCIUM 8.3* 8.2* 8.3* 7.8* 7.9*  MG 2.3 2.2 2.3  --   --     GFR: Estimated Creatinine Clearance: 20.3 mL/min (A) (by C-G formula based on SCr of 1.81 mg/dL (H)). Liver Function Tests: Recent Labs  Lab 08/06/21 0033  AST 15  ALT 8  ALKPHOS 61  BILITOT 0.5  PROT 5.0*  ALBUMIN 2.7*    No results for input(s): LIPASE, AMYLASE in the last 168 hours.  No results for input(s): AMMONIA in the last 168 hours. Coagulation Profile: No results for input(s): INR, PROTIME in the last 168 hours. Cardiac Enzymes: No results for input(s): CKTOTAL, CKMB, CKMBINDEX, TROPONINI in the last 168 hours. BNP (last 3 results) No results for input(s): PROBNP in the last 8760 hours. HbA1C: No results for input(s): HGBA1C in the last 72 hours.  CBG: Recent Labs  Lab 08/08/21 0743 08/08/21 1117 08/08/21 1616 08/08/21 2014 08/09/21 0926  GLUCAP 107* 128* 150* 164* 137*    Lipid Profile: No results for input(s): CHOL, HDL, LDLCALC, TRIG, CHOLHDL, LDLDIRECT in the last 72 hours. Thyroid Function Tests: No results for input(s): TSH, T4TOTAL, FREET4, T3FREE, THYROIDAB in the last 72 hours. Anemia Panel: No results for input(s): VITAMINB12, FOLATE, FERRITIN, TIBC, IRON, RETICCTPCT in the last 72 hours. Sepsis Labs: Recent Labs  Lab 08/02/21 1907  LATICACIDVEN 0.9     Recent Results (from the past 240 hour(s))  Resp Panel by RT-PCR (Flu A&B, Covid) Nasopharyngeal Swab     Status: None   Collection Time:  08/02/21 12:16 PM   Specimen: Nasopharyngeal Swab; Nasopharyngeal(NP) swabs in vial transport medium  Result Value Ref Range Status   SARS Coronavirus 2 by RT PCR NEGATIVE NEGATIVE Final    Comment: (NOTE) SARS-CoV-2 target nucleic acids are NOT DETECTED.  The SARS-CoV-2 RNA is generally detectable in upper respiratory specimens during the acute phase of infection. The lowest concentration of SARS-CoV-2 viral copies this assay can detect is 138 copies/mL. A negative result does not preclude SARS-Cov-2 infection and should not be used as the sole basis for treatment or other patient management decisions. A negative result may occur with  improper specimen collection/handling, submission of specimen other than nasopharyngeal swab, presence of viral mutation(s) within the areas targeted by this assay, and inadequate number of viral copies(<138 copies/mL). A negative result must be combined with clinical observations, patient history, and epidemiological information. The expected result is Negative.  Fact Sheet for Patients:  EntrepreneurPulse.com.au  Fact Sheet for Healthcare Providers:  IncredibleEmployment.be  This test  is no t yet approved or cleared by the [place] and  has been authorized for detection and/or diagnosis of SARS-CoV-2 by FDA under an Emergency Use Authorization (EUA). This EUA will remain  in effect (meaning this test can be used) for the duration of the COVID-19 declaration under Section 564(b)(1) of the Act, 21 U.S.C.section 360bbb-3(b)(1), unless the authorization is terminated  or revoked sooner.       Influenza A by PCR NEGATIVE NEGATIVE Final   Influenza B by PCR NEGATIVE NEGATIVE Final    Comment: (NOTE) The Xpert Xpress SARS-CoV-2/FLU/RSV plus assay is intended as an aid in the diagnosis of influenza from Nasopharyngeal swab specimens and should not be used as a sole basis for treatment. Nasal washings  and aspirates are unacceptable for Xpert Xpress SARS-CoV-2/FLU/RSV testing.  Fact Sheet for Patients: EntrepreneurPulse.com.au  Fact Sheet for Healthcare Providers: IncredibleEmployment.be  This test is not yet approved or cleared by the [place] FDA and has been authorized for detection and/or diagnosis of SARS-CoV-2 by FDA under an Emergency Use Authorization (EUA). This EUA will remain in effect (meaning this test can be used) for the duration of the COVID-19 declaration under Section 564(b)(1) of the Act, 21 U.S.C. section 360bbb-3(b)(1), unless the authorization is terminated or revoked.  Performed at [facility], [place] [address] [place], [place] [address]   Surgical pcr screen     Status: None   Collection Time: 08/04/21  9:40 PM   Specimen: Nasal Mucosa; Nasal Swab  Result Value Ref Range Status   MRSA, PCR NEGATIVE NEGATIVE Final   Staphylococcus aureus NEGATIVE NEGATIVE Final    Comment: (NOTE) The Xpert SA Assay (FDA approved for NASAL specimens in patients 45 years of age and older), is one component of a comprehensive surveillance program. It is not intended to diagnose infection nor to guide or monitor treatment. Performed at [facility], [place] [address] [place], [place] [address]           Radiology Studies: No results found.      Scheduled Meds:  acetaminophen  500 mg Oral BID   albuterol  2.5 mg Nebulization Q6H   enoxaparin (LOVENOX) injection  30 mg Subcutaneous Q24H   feeding supplement  237 mL Oral BID BM   furosemide  20 mg Oral Daily   insulin aspart  0-5 Units Subcutaneous QHS   insulin aspart  0-6 Units Subcutaneous TID WC   levothyroxine  50 mcg Oral QAC breakfast   lidocaine  1 patch Transdermal Q24H   metoprolol tartrate  12.5 mg Oral BID   multivitamin with minerals  1 tablet Oral Daily   pantoprazole (PROTONIX) IV  40 mg Intravenous Q12H   tamsulosin  0.4 mg Oral Daily    Continuous Infusions:  methocarbamol (ROBAXIN) IV       LOS: 6 days    Time spent: 35 minutes    Maciah Schweigert A Jonathon Castelo, MD [facility]   If 7PM-7AM, please contact night-coverage [email or web address]  08/09/2021, 2:15 PM

## 2021-08-09 NOTE — Plan of Care (Signed)

## 2021-08-09 NOTE — Progress Notes (Signed)
Patient ID: Nathan Kim, male   DOB: Feb 01, 1920, 87 y.o.   MRN: [id number] [facility] Surgery Progress Note:   4 Days Post-Op  Subjective: Mental status is bright and alert.  Complaints none. Objective: Vital signs in last 24 hours: Temp:  [97.5 F (36.4 C)-98.9 F (37.2 C)] 97.5 F (36.4 C) (11/06 0545) Pulse Rate:  [65-75] 65 (11/06 0545) Resp:  [16-17] 17 (11/06 0545) BP: (97-116)/(46-55) 116/53 (11/06 0545) SpO2:  [92 %-95 %] 92 % (11/06 0545)  Intake/Output from previous day: 11/05 0701 - 11/06 0700 In: -  Out: 1150 [Urine:1150] Intake/Output this shift: No intake/output data recorded.  Physical Exam: Work of breathing is normal.  Abdomen is soft and nontender  Lab Results:  Results for orders placed or performed during the hospital encounter of 08/02/21 (from the past 48 hour(s))  CBC     Status: Abnormal   Collection Time: 08/07/21  9:22 AM  Result Value Ref Range   WBC 13.4 (H) 4.0 - 10.5 K/uL   RBC 4.31 4.22 - 5.81 MIL/uL   Hemoglobin 12.2 (L) 13.0 - 17.0 g/dL   HCT 39.8 39.0 - 52.0 %   MCV 92.3 80.0 - 100.0 fL   MCH 28.3 26.0 - 34.0 pg   MCHC 30.7 30.0 - 36.0 g/dL   RDW 14.7 11.5 - 15.5 %   Platelets 208 150 - 400 K/uL   nRBC 0.0 0.0 - 0.2 %    Comment: Performed at [facility], [place] [address], [place], [place] [address]  Basic metabolic panel     Status: Abnormal   Collection Time: 08/07/21  9:22 AM  Result Value Ref Range   Sodium 138 135 - 145 mmol/L   Potassium 4.4 3.5 - 5.1 mmol/L   Chloride 110 98 - 111 mmol/L   CO2 22 22 - 32 mmol/L   Glucose, Bld 118 (H) 70 - 99 mg/dL    Comment: Glucose reference range applies only to samples taken after fasting for at least 8 hours.   BUN 34 (H) 8 - 23 mg/dL   Creatinine, Ser 1.97 (H) 0.61 - 1.24 mg/dL   Calcium 8.3 (L) 8.9 - 10.3 mg/dL   GFR, Estimated 30 (L) >60 mL/min    Comment: (NOTE) Calculated using the CKD-EPI Creatinine Equation (2021)    Anion gap 6 5 - 15    Comment: Performed at  [place] [address] [place], [place] [address]  Magnesium     Status: None   Collection Time: 08/07/21  9:22 AM  Result Value Ref Range   Magnesium 2.3 1.7 - 2.4 mg/dL    Comment: Performed at [place] [address] [place], [place] [address]  Glucose, capillary     Status: Abnormal   Collection Time: 08/07/21 11:43 AM  Result Value Ref Range   Glucose-Capillary 101 (H) 70 - 99 mg/dL    Comment: Glucose reference range applies only to samples taken after fasting for at least 8 hours.  Glucose, capillary     Status: Abnormal   Collection Time: 08/07/21  3:45 PM  Result Value Ref Range   Glucose-Capillary 118 (H) 70 - 99 mg/dL    Comment: Glucose reference range applies only to samples taken after fasting for at least 8 hours.  Glucose, capillary     Status: Abnormal   Collection Time: 08/07/21  8:53 PM  Result Value Ref Range   Glucose-Capillary 116 (H) 70 - 99 mg/dL  Comment: Glucose reference range applies only to samples taken after fasting for at least 8 hours.  Glucose, capillary     Status: Abnormal   Collection Time: 08/08/21  7:43 AM  Result Value Ref Range   Glucose-Capillary 107 (H) 70 - 99 mg/dL    Comment: Glucose reference range applies only to samples taken after fasting for at least 8 hours.   Comment 1 Notify RN   CBC     Status: Abnormal   Collection Time: 08/08/21  8:52 AM  Result Value Ref Range   WBC 9.2 4.0 - 10.5 K/uL   RBC 3.67 (L) 4.22 - 5.81 MIL/uL   Hemoglobin 10.6 (L) 13.0 - 17.0 g/dL   HCT 33.6 (L) 39.0 - 52.0 %   MCV 91.6 80.0 - 100.0 fL   MCH 28.9 26.0 - 34.0 pg   MCHC 31.5 30.0 - 36.0 g/dL   RDW 14.7 11.5 - 15.5 %   Platelets 159 150 - 400 K/uL   nRBC 0.0 0.0 - 0.2 %    Comment: Performed at [facility], [place] [address], [place], [place] [address]  Basic metabolic panel     Status: Abnormal   Collection Time: 08/08/21  8:52 AM  Result Value Ref Range   Sodium 138 135 - 145 mmol/L   Potassium 4.5 3.5 - 5.1  mmol/L   Chloride 108 98 - 111 mmol/L   CO2 23 22 - 32 mmol/L   Glucose, Bld 105 (H) 70 - 99 mg/dL    Comment: Glucose reference range applies only to samples taken after fasting for at least 8 hours.   BUN 31 (H) 8 - 23 mg/dL   Creatinine, Ser 1.97 (H) 0.61 - 1.24 mg/dL   Calcium 7.8 (L) 8.9 - 10.3 mg/dL   GFR, Estimated 30 (L) >60 mL/min    Comment: (NOTE) Calculated using the CKD-EPI Creatinine Equation (2021)    Anion gap 7 5 - 15    Comment: Performed at [place] [address] [place], [place] [address]  Glucose, capillary     Status: Abnormal   Collection Time: 08/08/21 11:17 AM  Result Value Ref Range   Glucose-Capillary 128 (H) 70 - 99 mg/dL    Comment: Glucose reference range applies only to samples taken after fasting for at least 8 hours.   Comment 1 Notify RN   Glucose, capillary     Status: Abnormal   Collection Time: 08/08/21  4:16 PM  Result Value Ref Range   Glucose-Capillary 150 (H) 70 - 99 mg/dL    Comment: Glucose reference range applies only to samples taken after fasting for at least 8 hours.  Glucose, capillary     Status: Abnormal   Collection Time: 08/08/21  8:14 PM  Result Value Ref Range   Glucose-Capillary 164 (H) 70 - 99 mg/dL    Comment: Glucose reference range applies only to samples taken after fasting for at least 8 hours.    Radiology/Results: No results found.  Anti-infectives: Anti-infectives (From admission, onward)    Start     Dose/Rate Route Frequency Ordered Stop   08/05/21 0921  sodium chloride 0.9 % with cefoTEtan (CEFOTAN) ADS Med       Note to Pharmacy: Gregery Na   : cabinet override      08/05/21 0921 08/05/21 1129   08/04/21 1100  cefoTEtan (CEFOTAN) 2 g in sodium chloride 0.9 % 100 mL IVPB  Status:  Discontinued  2 g 200 mL/hr over 30 Minutes Intravenous On call to O.R. 08/04/21 1001 08/05/21 0559       Assessment/Plan: Problem List: Patient Active Problem List   Diagnosis Date Noted    Protein-calorie malnutrition, severe 08/05/2021   SBO (small bowel obstruction) ([place]) 08/02/2021   Chronic diastolic CHF (congestive heart failure) ([place]) 08/02/2021   Hypothyroidism 06/06/2021   Ileus ([place]) 06/05/2021   CKD (chronic kidney disease), stage IV ([place]) 06/05/2021   Normocytic anemia 06/05/2021   Elevated troponin 06/05/2021   S/P right THA, AA 05/24/2017   Lumbar stenosis with neurogenic claudication 02/27/2016   PAF (paroxysmal atrial fibrillation) ([place]) 02/25/2016   Essential hypertension 02/25/2016   Preoperative clearance 02/25/2016   PVC's (premature ventricular contractions) 02/25/2016   First degree heart block 02/25/2016    Doing well and tolerating full liquids.  Advance to soft diet 4 Days Post-Op    LOS: 6 days   Matt B. Hassell Done, MD, [facility] [phone] to reach the surgeon on call.    08/09/2021 7:47 AM

## 2021-08-10 ENCOUNTER — Encounter: Payer: Self-pay | Admitting: Internal Medicine

## 2021-08-10 DIAGNOSIS — K56609 Unspecified intestinal obstruction, unspecified as to partial versus complete obstruction: Secondary | ICD-10-CM | POA: Diagnosis not present

## 2021-08-10 LAB — BASIC METABOLIC PANEL
Anion gap: 9 (ref 5–15)
BUN: 26 mg/dL — ABNORMAL HIGH (ref 8–23)
CO2: 24 mmol/L (ref 22–32)
Calcium: 8.1 mg/dL — ABNORMAL LOW (ref 8.9–10.3)
Chloride: 103 mmol/L (ref 98–111)
Creatinine, Ser: 1.74 mg/dL — ABNORMAL HIGH (ref 0.61–1.24)
GFR, Estimated: 35 mL/min — ABNORMAL LOW (ref >60)
Glucose, Bld: 149 mg/dL — ABNORMAL HIGH (ref 70–99)
Potassium: 3.9 mmol/L (ref 3.5–5.1)
Sodium: 136 mmol/L (ref 135–145)

## 2021-08-10 LAB — CBC
HCT: 35.4 % — ABNORMAL LOW (ref 39.0–52.0)
Hemoglobin: 11.3 g/dL — ABNORMAL LOW (ref 13.0–17.0)
MCH: 28.5 pg (ref 26.0–34.0)
MCHC: 31.9 g/dL (ref 30.0–36.0)
MCV: 89.2 fL (ref 80.0–100.0)
Platelets: 174 10*3/uL (ref 150–400)
RBC: 3.97 MIL/uL — ABNORMAL LOW (ref 4.22–5.81)
RDW: 14.6 % (ref 11.5–15.5)
WBC: 6.3 10*3/uL (ref 4.0–10.5)
nRBC: 0 % (ref 0.0–0.2)

## 2021-08-10 LAB — GLUCOSE, CAPILLARY
Glucose-Capillary: 122 mg/dL — ABNORMAL HIGH (ref 70–99)
Glucose-Capillary: 141 mg/dL — ABNORMAL HIGH (ref 70–99)
Glucose-Capillary: 150 mg/dL — ABNORMAL HIGH (ref 70–99)
Glucose-Capillary: 127 mg/dL — ABNORMAL HIGH (ref 70–99)

## 2021-08-10 MED ORDER — TRAMADOL HCL 50 MG PO TABS
25.0000 mg | ORAL_TABLET | Freq: Two times a day (BID) | ORAL | Status: DC | PRN
  Administered 2021-08-11: 25 mg via ORAL
  Filled 2021-08-10: qty 1

## 2021-08-10 MED ORDER — ASPIRIN EC 81 MG PO TBEC
81.0000 mg | DELAYED_RELEASE_TABLET | ORAL | Status: DC
  Administered 2021-08-10 – 2021-08-12 (×2): 81 mg via ORAL
  Filled 2021-08-10 (×2): qty 1

## 2021-08-10 MED ORDER — ALBUTEROL SULFATE (2.5 MG/3ML) 0.083% IN NEBU: 2.5000 mg | INHALATION_SOLUTION | Freq: Four times a day (QID) | RESPIRATORY_TRACT | Status: DC | PRN

## 2021-08-10 NOTE — TOC Initial Note (Addendum)
Transition of Care [facility]) - Initial/Assessment Note    Patient Details  Name: Nathan Kim MRN: [id number] Date of Birth: [birth year]  Transition of Care [facility]) CM/SW Contact:    Marilu Favre, RN Phone Number: 08/10/2021, 10:05 AM  Clinical Narrative:                 Spoke to patient and daughter  at bedside.   Discussed PT/OT  recommendations HHPT/OT . Both in agreement.   Provided medicare.gov list of home health agencies.   Patient and daughter's choices 1, [facility], 2 [facility]  NCM left message for Bronwen Betters with [facility] awaiting call back.   NCM ordered 3 in1 with Freda Munro with [place] Levada Dy with [facility] returned call, they do not cover [place]. Cory with [facility] accepted referral. 3 in 1 for home at bedside. PAtient and daughter aware.  Expected Discharge Plan: [place]     Patient Goals and CMS Choice Patient states their goals for this hospitalization and ongoing recovery are:: to return to home CMS Medicare.gov Compare Post Acute Care list provided to:: Patient Choice offered to / list presented to : Patient, Adult Children  Expected Discharge Plan and Services Expected Discharge Plan: [place]   Discharge Planning Services: CM Consult Post Acute Care Choice: Home Health, Durable Medical Equipment Living arrangements for the past 2 months: Single Family Home                 DME Arranged: 3-N-1 DME Agency: [employer] Date DME Agency Contacted: 08/10/21 Time DME Agency Contacted: 1004 Representative spoke with at DME Agency: Freda Munro HH Arranged: PT, OT [place] Agency: [facility] [place] Date [place]: 08/10/21 Time [place]: 1005 Representative spoke with at [place]: Levada Dy left message  Prior Living Arrangements/Services Living arrangements for the past 2 months: Single Family Home Lives with:: Spouse Patient language and need for interpreter reviewed:: Yes Do you feel safe going  back to the place where you live?: Yes      Need for Family Participation in Patient Care: Yes (Comment) Care giver support system in place?: Yes (comment)   Criminal Activity/Legal Involvement Pertinent to Current Situation/Hospitalization: No - Comment as needed  Activities of Daily Living      Permission Sought/Granted   Permission granted to share information with : Yes, Verbal Permission Granted  Share Information with NAME: daughter           Emotional Assessment Appearance:: Appears stated age Attitude/Demeanor/Rapport: Engaged Affect (typically observed): Accepting        Admission diagnosis:  Small bowel obstruction ([place]) [K56.609] Partial small bowel obstruction ([place]) [K56.600] SBO (small bowel obstruction) ([place]) [K56.609] Patient Active Problem List   Diagnosis Date Noted   Protein-calorie malnutrition, severe 08/05/2021   SBO (small bowel obstruction) ([place]) 08/02/2021   Chronic diastolic CHF (congestive heart failure) ([place]) 08/02/2021   Hypothyroidism 06/06/2021   Ileus ([place]) 06/05/2021   CKD (chronic kidney disease), stage IV ([place]) 06/05/2021   Normocytic anemia 06/05/2021   Elevated troponin 06/05/2021   S/P right THA, AA 05/24/2017   Lumbar stenosis with neurogenic claudication 02/27/2016   PAF (paroxysmal atrial fibrillation) ([place]) 02/25/2016   Essential hypertension 02/25/2016   Preoperative clearance 02/25/2016   PVC's (premature ventricular contractions) 02/25/2016   First degree heart block 02/25/2016   PCP:  [name], MD Pharmacy:   [facility] [place], [place] [address] [place]  [address] [place] [address] Phone: [phone] Fax: [phone]  [facility] [address], [place] [address] Phone: [phone] Fax: [phone]  [facility] [place], [place] [address] Phone: [phone] Fax: [phone]     Social Determinants of Health (SDOH) Interventions    Readmission Risk Interventions No flowsheet data found.

## 2021-08-10 NOTE — Discharge Instructions (Signed)
[facility]      [facility] [phone]  OPEN ABDOMINAL SURGERY: POST OP INSTRUCTIONS  Always review your discharge instruction sheet given to you by the facility where your surgery was performed.  IF YOU HAVE DISABILITY OR FAMILY LEAVE FORMS, YOU MUST BRING THEM TO THE OFFICE FOR PROCESSING.  PLEASE DO NOT GIVE THEM TO YOUR DOCTOR.  A prescription for pain medication may be given to you upon discharge.  Take your pain medication as prescribed, if needed.  If narcotic pain medicine is not needed, then you may take acetaminophen (Tylenol) or ibuprofen (Advil) as needed. Take your usually prescribed medications unless otherwise directed. If you need a refill on your pain medication, please contact your pharmacy. They will contact our office to request authorization.  Prescriptions will not be filled after 5pm or on week-ends. You should follow a light diet the first few days after arrival home, such as soup and crackers, pudding, etc.unless your doctor has advised otherwise. A high-fiber, low fat diet can be resumed as tolerated.   Be sure to include lots of fluids daily. Most patients will experience some swelling and bruising on the chest and neck area.  Ice packs will help.  Swelling and bruising can take several days to resolve Most patients will experience some swelling and bruising in the area of the incision. Ice pack will help. Swelling and bruising can take several days to resolve..  It is common to experience some constipation if taking pain medication after surgery.  Increasing fluid intake and taking a stool softener will usually help or prevent this problem from occurring.  A mild laxative (Milk of Magnesia or Miralax) should be taken according to package directions if there are no bowel movements after 48 hours.  You may have steri-strips (small skin tapes) in place directly over the incision.  These strips should be left on the skin for 7-10 days.  If your surgeon used skin  glue on the incision, you may shower in 24 hours.  The glue will flake off over the next 2-3 weeks.  Any sutures or staples will be removed at the office during your follow-up visit. You may find that a light gauze bandage over your incision may keep your staples from being rubbed or pulled. You may shower and replace the bandage daily. ACTIVITIES:  You may resume regular (light) daily activities beginning the next day--such as daily self-care, walking, climbing stairs--gradually increasing activities as tolerated.  You may have sexual intercourse when it is comfortable.  Refrain from any heavy lifting or straining until approved by your doctor. You may drive when you no longer are taking prescription pain medication, you can comfortably wear a seatbelt, and you can safely maneuver your car and apply brakes Return to Work: ___________________________________ You should see your doctor in the office for a follow-up appointment approximately two weeks after your surgery.  Make sure that you call for this appointment within a day or two after you arrive home to insure a convenient appointment time. OTHER INSTRUCTIONS:  _____________________________________________________________ _____________________________________________________________  WHEN TO CALL YOUR DOCTOR: Fever over 101.0 Inability to urinate Nausea and/or vomiting Extreme swelling or bruising Continued bleeding from incision. Increased pain, redness, or drainage from the incision. Difficulty swallowing or breathing Muscle cramping or spasms. Numbness or tingling in hands or feet or around lips.  The clinic staff is available to answer your questions during regular business hours.  Please don't hesitate to call and ask to speak to one of   the nurses if you have concerns.  For further questions, please visit [email or web address]  

## 2021-08-10 NOTE — Progress Notes (Signed)
Physical Therapy Treatment Patient Details Name: Nathan Kim MRN: [id number] DOB: 06-07-1920 Today's Date: 08/10/2021   History of Present Illness Pt adm 10/30 with abdominal pain, N/V, and weight loss. Pt thought to have partial SBO. 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 PMH - back surgery, rt THR, ckd, arthritis, afib    PT Comments    Pt was seen for attempt to walk but was still fatigued from OT session.  Pt agreed to do bed exercises, and was able to tolerate 15 reps with AAROM on the more strenuous SLR's.  Pt is motivated to work and will recommend a big push to get up to chair tomorrow.  Follow along with him to do gait training and balance work to prepare for home.  Vitals were stable, good tolerances with no SOB or effortful work.     Recommendations for follow up therapy are one component of a multi-disciplinary discharge planning process, led by the attending physician.  Recommendations may be updated based on patient status, additional functional criteria and insurance authorization.  Follow Up Recommendations  Home health PT     Assistance Recommended at Discharge Intermittent Supervision/Assistance  Equipment Recommendations  None recommended by PT    Recommendations for Other Services       Precautions / Restrictions Precautions Precautions: Fall Precaution Comments: abdominal precautions for comfort Restrictions Weight Bearing Restrictions: No     Mobility  Bed Mobility Overal bed mobility: Needs Assistance Bed Mobility:  (scooting up the bed) Rolling: Supervision Sidelying to sit: Min guard Supine to sit: Min guard Sit to supine: Min guard   General bed mobility comments: supervision to move    Transfers Overall transfer level: Needs assistance Equipment used: Rolling walker (2 wheels) Transfers: Sit to/from Stand Sit to Stand: Min guard;From elevated surface            General transfer comment: declined    Ambulation/Gait                   Stairs             Wheelchair Mobility    Modified Rankin (Stroke Patients Only)       Balance Overall balance assessment: Mild deficits observed, not formally tested Sitting-balance support: Feet supported;No upper extremity supported Sitting balance-Leahy Scale: Good     Standing balance support: During functional activity;Bilateral upper extremity supported                                Cognition Arousal/Alertness: Awake/alert Behavior During Therapy: WFL for tasks assessed/performed Overall Cognitive Status: Within Functional Limits for tasks assessed                                 General Comments: [facility]        Exercises General Exercises - Lower Extremity Ankle Circles/Pumps: AAROM;5 reps Quad Sets: AROM;15 reps Gluteal Sets: AROM;10 reps Long Arc Quad: AAROM Heel Slides: AROM;15 reps Hip ABduction/ADduction: AROM;15 reps Straight Leg Raises: AROM;10 reps    General Comments General comments (skin integrity, edema, etc.): pt is in bed and fatigued from just being up with OT, agreed to do bed exercises at least      Pertinent Vitals/Pain Pain Assessment: 0-10 Pain Score: 8  Faces Pain Scale: Hurts whole lot Breathing: normal Negative Vocalization:  none Facial Expression: smiling or inexpressive Body Language: relaxed Consolability: no need to console PAINAD Score: 0 Pain Intervention(s): Premedicated before session    Home Living                          Prior Function            PT Goals (current goals can now be found in the care plan section) Acute Rehab PT Goals Patient Stated Goal: return home PT Goal Formulation: With patient Progress towards PT goals: Not progressing toward goals - comment    Frequency    Min 3X/week      PT Plan Current plan remains appropriate    Co-evaluation               AM-PAC PT "6 Clicks" Mobility   Outcome Measure  Help needed turning from your back to your side while in a flat bed without using bedrails?: A Little Help needed moving from lying on your back to sitting on the side of a flat bed without using bedrails?: A Little Help needed moving to and from a bed to a chair (including a wheelchair)?: A Little Help needed standing up from a chair using your arms (e.g., wheelchair or bedside chair)?: A Little Help needed to walk in hospital room?: A Little Help needed climbing 3-5 steps with a railing? : A Little 6 Click Score: 18    End of Session   Activity Tolerance: Patient tolerated treatment well Patient left: in bed;with call bell/phone within reach;with bed alarm set;with SCD's reapplied;with family/visitor present Nurse Communication: Mobility status PT Visit Diagnosis: Other abnormalities of gait and mobility (R26.89);Muscle weakness (generalized) (M62.81);Pain Pain - part of body:  (abd)     Time: 5027-7412 PT Time Calculation (min) (ACUTE ONLY): 17 min  Charges:  $Therapeutic Exercise: 8-22 mins Ramond Dial 08/10/2021, 2:17 PM  Mee Hives, PT PhD Acute Rehab Dept. Number: [place] [facility] [place]

## 2021-08-10 NOTE — Progress Notes (Addendum)
Progress Note  5 Days Post-Op  Subjective: Tolerating soft diet well - no nausea or emesis. BM this am. Working with PT/OT. Pain well controlled. No other complaints.  Daughter is bedside.   Objective: Vital signs in last 24 hours: Temp:  [97.9 F (36.6 C)-98.4 F (36.9 C)] 98 F (36.7 C) (11/07 0522) Pulse Rate:  [61-80] 61 (11/07 0522) Resp:  [16-18] 16 (11/07 0522) BP: (110-127)/(49-70) 117/65 (11/07 0522) SpO2:  [90 %-98 %] 98 % (11/07 0522) Last BM Date: 08/09/21  Intake/Output from previous day: 11/06 0701 - 11/07 0700 In: 720 [P.O.:720] Out: 1000 [Urine:1000] Intake/Output this shift: No intake/output data recorded.  PE: General: pleasant, WD, male who is sitting up in chair in NAD HEENT: head is normocephalic, atraumatic.    Mouth is pink and moist Heart: regular, rate, and rhythm.  Palpable radial pulses bilaterally Lungs: CTAB  Respiratory effort nonlabored Abd: soft, ND, +BS, mild TTP over incisions without rebound or guarding. Incisions c/d/I with glue intact. No erythema or discharge. Honeycomb dressing removed from midline incision this am MSK: all 4 extremities are symmetrical with no cyanosis, clubbing, or edema. No calf TTP bilaterally Skin: warm and dry with no masses, lesions, or rashes Psych: A&Ox3 with an appropriate affect.    Lab Results:  Recent Labs    08/08/21 0852 08/09/21 0715  WBC 9.2 7.8  HGB 10.6* 10.3*  HCT 33.6* 33.4*  PLT 159 148*   BMET Recent Labs    08/08/21 0852 08/09/21 0715  NA 138 136  K 4.5 3.7  CL 108 108  CO2 23 22  GLUCOSE 105* 147*  BUN 31* 29*  CREATININE 1.97* 1.81*  CALCIUM 7.8* 7.9*   PT/INR No results for input(s): LABPROT, INR in the last 72 hours. CMP     Component Value Date/Time   NA 136 08/09/2021 0715   NA 140 01/20/2017 1145   K 3.7 08/09/2021 0715   CL 108 08/09/2021 0715   CO2 22 08/09/2021 0715   GLUCOSE 147 (H) 08/09/2021 0715   BUN 29 (H) 08/09/2021 0715   BUN 36 01/20/2017  1145   CREATININE 1.81 (H) 08/09/2021 0715   CREATININE 1.78 (H) 12/16/2016 1110   CALCIUM 7.9 (L) 08/09/2021 0715   PROT 5.0 (L) 08/06/2021 0033   ALBUMIN 2.7 (L) 08/06/2021 0033   AST 15 08/06/2021 0033   ALT 8 08/06/2021 0033   ALKPHOS 61 08/06/2021 0033   BILITOT 0.5 08/06/2021 0033   GFRNONAA 33 (L) 08/09/2021 0715   GFRAA 27 (L) 10/22/2019 1116   Lipase     Component Value Date/Time   LIPASE 26 08/02/2021 0636       Studies/Results: No results found.  Anti-infectives: Anti-infectives (From admission, onward)    Start     Dose/Rate Route Frequency Ordered Stop   08/05/21 0921  sodium chloride 0.9 % with cefoTEtan (CEFOTAN) ADS Med       Note to Pharmacy: Gregery Na   : cabinet override      08/05/21 0921 08/05/21 1129   08/04/21 1100  cefoTEtan (CEFOTAN) 2 g in sodium chloride 0.9 % 100 mL IVPB  Status:  Discontinued        2 g 200 mL/hr over 30 Minutes Intravenous On call to O.R. 08/04/21 1001 08/05/21 0559        Assessment/Plan  Bowel obstruction secondary to cecal mass  POD#5 S/P LAPAROSCOPIC ASSISTED RIGHT HEMICOLECTOMY 08/06/21 Dr. Brantley Stage - afebrile, VSS,- await surgical pathology - tolerating soft diet  well - IS and pulm toilet  - PT/OT  - hopefully discharge soon - okay to resume ASA from surgical standpoint   ID - none FEN - soft, lasix 20 mg po daily  Check BMET  VTE - SCDs, lovenox Foley - D/C   HTN PAF not on anticoagulation CHF CKD-IV Hypothyroidism First degree heart block BPH Code status DNR     LOS: 7 days    Winferd Humphrey, [facility] 08/10/2021, 7:44 AM Please see Amion for pager number during day hours 7:00am-4:30pm

## 2021-08-10 NOTE — Progress Notes (Signed)
Occupational Therapy Treatment Patient Details Name: Nathan Kim MRN: [id number] DOB: December 23, 1919 Today's Date: 08/10/2021   History of present illness Pt adm 10/30 with abdominal pain, N/V, and weight loss. Pt thought to have partial SBO. 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 PMH - back surgery, rt THR, ckd, arthritis, afib   OT comments  Pt resting in bed upon arrival, states he feels tired and rates abdominal pain 8/10. Despite pain, pt Min Guard for bed mobility, requiring SPV to roll to sidelying before sitting upright. Pt completed simulated toilet transfer [place] with RW, transferring bed <> chair, pt declines sitting in recliner, pt/wife state he already sat up in recliner for a few hours this morning. Pt returned to bed, wife at bedside. Pt limited by pain, decreased balance, decreased strength and activity tolerance at this time, will continue to follow acutely. Pt to benefit from d/c home with [place].   Recommendations for follow up therapy are one component of a multi-disciplinary discharge planning process, led by the attending physician.  Recommendations may be updated based on patient status, additional functional criteria and insurance authorization.    Follow Up Recommendations  Home health OT    Assistance Recommended at Discharge Intermittent Supervision/Assistance  Equipment Recommendations  BSC/3in1    Recommendations for Other Services      Precautions / Restrictions Precautions Precautions: Fall Precaution Comments: abdominal precautions for comfort Restrictions Weight Bearing Restrictions: No       Mobility Bed Mobility Overal bed mobility: Needs Assistance Bed Mobility: Supine to Sit;Sit to Supine;Rolling Rolling: Supervision Sidelying to sit: Min guard Supine to sit: Min guard Sit to supine: Min guard   General bed mobility comments: overall requiring increased time due to  pain    Transfers Overall transfer level: Needs assistance Equipment used: Rolling walker (2 wheels) Transfers: Sit to/from Stand Sit to Stand: Min guard;From elevated surface           General transfer comment: increased time, v/c's to lean forward and push from bed first     Balance Overall balance assessment: Mild deficits observed, not formally tested Sitting-balance support: Feet supported;No upper extremity supported Sitting balance-Leahy Scale: Good     Standing balance support: During functional activity;Bilateral upper extremity supported                               ADL either performed or assessed with clinical judgement   ADL Overall ADL's : Needs assistance/impaired                         Toilet Transfer: Min guard;Ambulation;Rolling walker (2 wheels) Toilet Transfer Details (indicate cue type and reason): simulated to recliner & return to bed, pt & family state pt has already sat up in recliner for a few hours today                Extremity/Trunk Assessment Upper Extremity Assessment Upper Extremity Assessment: Overall WFL for tasks assessed   Lower Extremity Assessment Lower Extremity Assessment: Defer to PT evaluation        Vision   Vision Assessment?: No apparent visual deficits   Perception Perception Perception: Within Functional Limits   Praxis Praxis Praxis: Not tested    Cognition Arousal/Alertness: Awake/alert Behavior During Therapy: WFL for tasks assessed/performed Overall Cognitive Status: Within Functional Limits for tasks assessed  General Comments: HOH          Exercises     Shoulder Instructions       General Comments Spouse present during session    Pertinent Vitals/ Pain       Pain Assessment: 0-10 Pain Score: 8  Faces Pain Scale: Hurts whole lot Breathing: normal Pain Intervention(s): Limited activity within patient's  tolerance;Monitored during session;Repositioned  Home Living                                          Prior Functioning/Environment              Frequency  Min 2X/week        Progress Toward Goals  OT Goals(current goals can now be found in the care plan section)  Progress towards OT goals: Progressing toward goals  Acute Rehab OT Goals Patient Stated Goal: return to PLOF OT Goal Formulation: With patient Time For Goal Achievement: 08/21/21 Potential to Achieve Goals: Good ADL Goals Pt Will Perform Grooming: with supervision;standing Pt Will Perform Lower Body Bathing: sit to/from stand;sitting/lateral leans;with min guard assist Pt Will Perform Lower Body Dressing: with min guard assist;sit to/from stand;sitting/lateral leans Pt Will Transfer to Toilet: with min guard assist;ambulating Pt Will Perform Tub/Shower Transfer: with min guard assist;Tub transfer;ambulating;rolling walker  Plan Discharge plan remains appropriate;Frequency remains appropriate    Co-evaluation                 AM-PAC OT "6 Clicks" Daily Activity     Outcome Measure   Help from another person eating meals?: None Help from another person taking care of personal grooming?: A Little Help from another person toileting, which includes using toliet, bedpan, or urinal?: A Little Help from another person bathing (including washing, rinsing, drying)?: A Lot Help from another person to put on and taking off regular upper body clothing?: A Lot Help from another person to put on and taking off regular lower body clothing?: A Little 6 Click Score: 17    End of Session Equipment Utilized During Treatment: Gait belt;Rolling walker (2 wheels)  OT Visit Diagnosis: Unsteadiness on feet (R26.81);Other abnormalities of gait and mobility (R26.89);Muscle weakness (generalized) (M62.81);Pain   Activity Tolerance Patient tolerated treatment well   Patient Left in bed;with bed alarm  set;with family/visitor present;with call bell/phone within reach   Nurse Communication Mobility status;Other (comment) (notified nurse to check catheter, pt's wife reported it was making a lot of noise)        Time: 9480-1655 OT Time Calculation (min): 22 min  Charges: OT General Charges $OT Visit: 1 Visit OT Treatments $Self Care/Home Management : 8-22 mins  Lynnda Child, OTD, OTR/L Acute Rehab [phone]) [phone] - [place] 08/10/2021, 2:10 PM

## 2021-08-10 NOTE — Progress Notes (Signed)
PROGRESS NOTE    Nathan Kim  JKD:[id number] DOB: 22-Dec-1919 DOA: 08/02/2021 PCP: [name], MD   Brief Narrative: 87 year old with past medical history significant for hypertension, PAF, diastolic heart failure, CKD stage IV, normocytic anemia, hypothyroidism, history of first-degree AV block, presents to the ED complaining of abdominal pain, poor appetite, nausea vomiting and weight loss.  He was discharged home September was 5 for 2 weeks with normal appetite, he will have episode of intermittent abdominal pain and not able to eat.  He presented to the ED and he was found to have a small bowel obstruction.  General surgery was consulted.  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 Dilaudid to 0.5 to 1 mg as needed every 3 hours for pain Continue with IV Protonix twice daily NSL fluid Await pathology  NG tube removed.  Schedule tylenol for pain management.  Tolerating Soft diet.  Still requiring IV dilaudid. Will try tramadol low dose PRN for pain.  Home soon per Sx  2-PAF: Resume home dose metoprolol/.  Resume aspirin today.   CKD stage IIIb: Monitor renal function Continue range 1.5---1.7 Cr stable today. 1.7 Continue to monitor on oral lasix.   Chronic diastolic heart failure: on low dose lasix.  NSL.   Hypertension: on metoprolol.  Hypothyroidism: on Synthroid BPH ; Flomax Hands edema; suspect  related to hypoalbuminemia. Started  ensure.  Respiratory System:  BL ronchus on exam 11/06., incentive spirometry. Nebulizer tx ordered.  Improved.   Nutrition Problem: Severe Malnutrition Etiology: chronic illness (CHF, CKD IV)    Signs/Symptoms: severe muscle depletion, severe fat depletion    Interventions: MVI, Boost Breeze, Prostat  Estimated body mass index is 19.83 kg/m as calculated from the following:   Height as of this encounter: 5\' 11"  (1.803 m).   Weight as of this encounter: 64.5 kg.   DVT prophylaxis: Lovenox Code Status: DNR Family Communication: Daughter at bedside 11/07 Disposition Plan:  Status is: Inpatient  Remains inpatient appropriate because: Patient admitted with SBO found to have cecum cancer        Consultants:  Surgery   Procedures:  Laparoscopic-assisted right hemicolectomy  Antimicrobials:    Subjective: He is sitting recliner. He is tired  He has been requiring IV dilaudid twice a day for pain.  Had BM. Tolerating soft diet.   Objective: Vitals:   08/09/21 2041 08/09/21 2116 08/10/21 0522 08/10/21 0754  BP: 127/70  117/65 140/61  Pulse: 80  61 65  Resp: 18  16 16   Temp: 97.9 F (36.6 C)  98 F (36.7 C) 98.3 F (36.8 C)  TempSrc: Oral  Oral Oral  SpO2: 96% 96% 98% 100%  Weight:      Height:        Intake/Output Summary (Last 24 hours) at 08/10/2021 1622 Last data filed at 08/10/2021 0540 Gross per 24 hour  Intake 360 ml  Output 1000 ml  Net -640 ml    Filed Weights   08/05/21 0906  08/07/21 0515 08/08/21 0355  Weight: 63.2 kg 64.6 kg 64.5 kg    Examination:  General exam: NAD Respiratory system; CTA Cardiovascular system: S 1, S 2 RRR Gastrointestinal system: BS present, soft, mild tenderness , incision with dressing Central nervous system: Alert, interactive.  Extremities: trace edema.     Data Reviewed: I have personally reviewed following labs and imaging studies  CBC: Recent Labs  Lab  08/06/21 0033 08/07/21 0922 08/08/21 0852 08/09/21 0715 08/10/21 0833  WBC 11.7* 13.4* 9.2 7.8 6.3  HGB 11.3* 12.2* 10.6* 10.3* 11.3*  HCT 37.1* 39.8 33.6* 33.4* 35.4*  MCV 91.6 92.3 91.6 92.0 89.2  PLT 165 208 159 148* 734    Basic Metabolic Panel: Recent Labs  Lab 08/05/21 0114 08/06/21 0033 08/07/21 0922 08/08/21 0852 08/09/21 0715 08/10/21 0833  NA 140 140 138 138 136 136  K 4.1 4.6 4.4 4.5 3.7 3.9  CL 114* 112* 110 108 108 103  CO2 20* 22 22 23 22 24   GLUCOSE 153* 248* 118* 105* 147* 149*  BUN 40* 35* 34* 31* 29* 26*  CREATININE 1.74* 1.77* 1.97* 1.97* 1.81* 1.74*  CALCIUM 8.3* 8.2* 8.3* 7.8* 7.9* 8.1*  MG 2.3 2.2 2.3  --   --   --     GFR: Estimated Creatinine Clearance: 21.1 mL/min (A) (by C-G formula based on SCr of 1.74 mg/dL (H)). Liver Function Tests: Recent Labs  Lab 08/06/21 0033  AST 15  ALT 8  ALKPHOS 61  BILITOT 0.5  PROT 5.0*  ALBUMIN 2.7*    No results for input(s): LIPASE, AMYLASE in the last 168 hours.  No results for input(s): AMMONIA in the last 168 hours. Coagulation Profile: No results for input(s): INR, PROTIME in the last 168 hours. Cardiac Enzymes: No results for input(s): CKTOTAL, CKMB, CKMBINDEX, TROPONINI in the last 168 hours. BNP (last 3 results) No results for input(s): PROBNP in the last 8760 hours. HbA1C: No results for input(s): HGBA1C in the last 72 hours.  CBG: Recent Labs  Lab 08/09/21 0926 08/09/21 1839 08/09/21 2056 08/10/21 0806 08/10/21 1119  GLUCAP 137* 160* 166* 122* 141*    Lipid Profile: No results for input(s): CHOL, HDL, LDLCALC, TRIG, CHOLHDL, LDLDIRECT in the last 72 hours. Thyroid Function Tests: No results for input(s): TSH, T4TOTAL, FREET4, T3FREE, THYROIDAB in the last 72 hours. Anemia Panel: No results for input(s): VITAMINB12, FOLATE, FERRITIN, TIBC, IRON, RETICCTPCT in the last 72 hours. Sepsis Labs: No results for input(s): PROCALCITON, LATICACIDVEN in the last 168  hours.   Recent Results (from the past 240 hour(s))  Resp Panel by RT-PCR (Flu A&B, Covid) Nasopharyngeal Swab     Status: None   Collection Time: 08/02/21 12:16 PM   Specimen: Nasopharyngeal Swab; Nasopharyngeal(NP) swabs in vial transport medium  Result Value Ref Range Status   SARS Coronavirus 2 by RT PCR NEGATIVE NEGATIVE Final    Comment: (NOTE) SARS-CoV-2 target nucleic acids are NOT DETECTED.  The SARS-CoV-2 RNA is generally detectable in upper respiratory specimens during the acute phase of infection. The lowest concentration of SARS-CoV-2 viral copies this assay can detect is 138 copies/mL. A negative result does not preclude SARS-Cov-2 infection and should not be used as the sole basis for treatment or other patient management decisions. A negative result may occur with  improper specimen collection/handling, submission of specimen other than nasopharyngeal swab, presence of viral mutation(s) within the areas targeted by this assay, and inadequate number of viral copies(<138 copies/mL). A negative result must be  combined with clinical observations, patient history, and epidemiological information. The expected result is Negative.  Fact Sheet for Patients:  EntrepreneurPulse.com.au  Fact Sheet for Healthcare Providers:  IncredibleEmployment.be  This test is no t yet approved or cleared by the [place] FDA and  has been authorized for detection and/or diagnosis of SARS-CoV-2 by FDA under an Emergency Use Authorization (EUA). This EUA will remain  in effect (meaning this test can be used) for the duration of the COVID-19 declaration under Section 564(b)(1) of the Act, 21 U.S.C.section 360bbb-3(b)(1), unless the authorization is terminated  or revoked sooner.       Influenza A by PCR NEGATIVE NEGATIVE Final   Influenza B by PCR NEGATIVE NEGATIVE Final    Comment: (NOTE) The Xpert Xpress SARS-CoV-2/FLU/RSV plus assay is intended  as an aid in the diagnosis of influenza from Nasopharyngeal swab specimens and should not be used as a sole basis for treatment. Nasal washings and aspirates are unacceptable for Xpert Xpress SARS-CoV-2/FLU/RSV testing.  Fact Sheet for Patients: EntrepreneurPulse.com.au  Fact Sheet for Healthcare Providers: IncredibleEmployment.be  This test is not yet approved or cleared by the [place] FDA and has been authorized for detection and/or diagnosis of SARS-CoV-2 by FDA under an Emergency Use Authorization (EUA). This EUA will remain in effect (meaning this test can be used) for the duration of the COVID-19 declaration under Section 564(b)(1) of the Act, 21 U.S.C. section 360bbb-3(b)(1), unless the authorization is terminated or revoked.  Performed at [facility], [place] [address] [place], [place] [address]   Surgical pcr screen     Status: None   Collection Time: 08/04/21  9:40 PM   Specimen: Nasal Mucosa; Nasal Swab  Result Value Ref Range Status   MRSA, PCR NEGATIVE NEGATIVE Final   Staphylococcus aureus NEGATIVE NEGATIVE Final    Comment: (NOTE) The Xpert SA Assay (FDA approved for NASAL specimens in patients 50 years of age and older), is one component of a comprehensive surveillance program. It is not intended to diagnose infection nor to guide or monitor treatment. Performed at [facility], [place] [address] [place], [place] [address]           Radiology Studies: No results found.      Scheduled Meds:  acetaminophen  500 mg Oral BID   albuterol  2.5 mg Nebulization TID   aspirin EC  81 mg Oral QODAY   enoxaparin (LOVENOX) injection  30 mg Subcutaneous Q24H   feeding supplement  237 mL Oral BID BM   furosemide  20 mg Oral Daily   insulin aspart  0-5 Units Subcutaneous QHS   insulin aspart  0-6 Units Subcutaneous TID WC   levothyroxine  50 mcg Oral QAC breakfast   lidocaine  1 patch Transdermal Q24H    metoprolol tartrate  12.5 mg Oral BID   multivitamin with minerals  1 tablet Oral Daily   pantoprazole (PROTONIX) IV  40 mg Intravenous Q12H   tamsulosin  0.4 mg Oral Daily   Continuous Infusions:  methocarbamol (ROBAXIN) IV       LOS: 7 days    Time spent: 35 minutes    Robynne Roat A Shawan Corella, MD [facility]   If 7PM-7AM, please contact night-coverage [email or web address]  08/10/2021, 4:22 PM

## 2021-08-11 DIAGNOSIS — K56609 Unspecified intestinal obstruction, unspecified as to partial versus complete obstruction: Secondary | ICD-10-CM | POA: Diagnosis not present

## 2021-08-11 LAB — BASIC METABOLIC PANEL
Anion gap: 9 (ref 5–15)
BUN: 24 mg/dL — ABNORMAL HIGH (ref 8–23)
CO2: 26 mmol/L (ref 22–32)
Calcium: 8 mg/dL — ABNORMAL LOW (ref 8.9–10.3)
Chloride: 101 mmol/L (ref 98–111)
Creatinine, Ser: 1.7 mg/dL — ABNORMAL HIGH (ref 0.61–1.24)
GFR, Estimated: 36 mL/min — ABNORMAL LOW (ref >60)
Glucose, Bld: 162 mg/dL — ABNORMAL HIGH (ref 70–99)
Potassium: 4.2 mmol/L (ref 3.5–5.1)
Sodium: 136 mmol/L (ref 135–145)

## 2021-08-11 LAB — GLUCOSE, CAPILLARY
Glucose-Capillary: 113 mg/dL — ABNORMAL HIGH (ref 70–99)
Glucose-Capillary: 131 mg/dL — ABNORMAL HIGH (ref 70–99)
Glucose-Capillary: 166 mg/dL — ABNORMAL HIGH (ref 70–99)
Glucose-Capillary: 122 mg/dL — ABNORMAL HIGH (ref 70–99)

## 2021-08-11 MED ORDER — ENSURE ENLIVE PO LIQD
237.0000 mL | Freq: Three times a day (TID) | ORAL | Status: DC
  Administered 2021-08-12: 237 mL via ORAL

## 2021-08-11 MED ORDER — POLYVINYL ALCOHOL 1.4 % OP SOLN
1.0000 [drp] | OPHTHALMIC | Status: DC | PRN
  Filled 2021-08-11: qty 15

## 2021-08-11 MED ORDER — DOCUSATE SODIUM 100 MG PO CAPS: 100.0000 mg | ORAL_CAPSULE | Freq: Every day | ORAL | Status: DC

## 2021-08-11 MED ORDER — POLYETHYLENE GLYCOL 3350 17 G PO PACK: 17.0000 g | PACK | Freq: Every day | ORAL | Status: DC

## 2021-08-11 MED ORDER — PROSOURCE PLUS PO LIQD
30.0000 mL | Freq: Two times a day (BID) | ORAL | Status: DC
  Administered 2021-08-12: 30 mL via ORAL
  Filled 2021-08-11: qty 30

## 2021-08-11 MED ORDER — OXYCODONE HCL 5 MG PO TABS
5.0000 mg | ORAL_TABLET | ORAL | Status: DC | PRN
  Administered 2021-08-11 – 2021-08-12 (×3): 5 mg via ORAL
  Filled 2021-08-11 (×3): qty 1

## 2021-08-11 MED ORDER — SACCHAROMYCES BOULARDII 250 MG PO CAPS
250.0000 mg | ORAL_CAPSULE | Freq: Two times a day (BID) | ORAL | Status: DC
  Administered 2021-08-11 – 2021-08-12 (×3): 250 mg via ORAL
  Filled 2021-08-11 (×3): qty 1

## 2021-08-11 MED ORDER — HYDROMORPHONE HCL 1 MG/ML IJ SOLN: 0.5000 mg | INTRAMUSCULAR | Status: DC | PRN

## 2021-08-11 NOTE — Progress Notes (Signed)
PROGRESS NOTE    Nathan Kim  GUY:[id number] DOB: 07-20-1920 DOA: 08/02/2021 PCP: [name], MD   Brief Narrative: 87 year old with past medical history significant for hypertension, PAF, diastolic heart failure, CKD stage IV, normocytic anemia, hypothyroidism, history of first-degree AV block, presents to the ED complaining of abdominal pain, poor appetite, nausea vomiting and weight loss.  He was discharged home September was 5 for 2 weeks with normal appetite, he will have episode of intermittent abdominal pain and not able to eat.  He presented to the ED and he was found to have a small bowel obstruction.  General surgery was consulted.  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 Dilaudid to 0.5 to 1 mg as needed every 3 hours for pain Continue with  Protonix twice daily NSL fluid Pathology: Consistent with invasive moderately differentiated adenocarcinoma with mucinous features, 3 of 8 lymph node positive for metastatic carcinoma.  Surgery discussed results with family. NG tube removed.  Schedule tylenol for pain management.  Tolerating Soft diet.  Hopefully transition to oral pain medication today.   2-PAF: Resume home dose metoprolol/.  Resume aspirin.  CKD stage  IIIb: Monitor renal function Continue range 1.5---1.7 Cr stable today. 1.7 Continue to monitor on oral lasix.   Chronic diastolic heart failure: on low dose lasix.  NSL.   Hypertension: on metoprolol.  Hypothyroidism: on Synthroid BPH ; Flomax Hands edema; suspect related to hypoalbuminemia. Started  ensure.  Respiratory System:  BL ronchus on exam 11/06., incentive spirometry. Nebulizer tx ordered.  Improved.  Diarrhea: Patient had 3 episode of bowel movement today.  Start Florastor Nutrition Problem: Severe Malnutrition Etiology: chronic illness (CHF, CKD IV)    Signs/Symptoms: severe muscle depletion, severe fat depletion    Interventions: MVI, Boost Breeze, Prostat  Estimated body mass index is 20.91 kg/m as calculated from the following:   Height as of this encounter: 5\' 11"  (1.803 m).   Weight as of this encounter: 68 kg.   DVT prophylaxis: Lovenox Code Status: DNR Family Communication: Daughter at bedside 11/08 Disposition Plan:  Status is: Inpatient  Remains inpatient appropriate because: Patient admitted with SBO found to have cecum cancer        Consultants:  Surgery   Procedures:  Laparoscopic-assisted right hemicolectomy  Antimicrobials:    Subjective: He is having lower quadrant abdominal pain.  He has been emptying his bladder.  Objective: Vitals:   08/11/21 0500 08/11/21 0503 08/11/21 0507 08/11/21 0741  BP:  119/68 119/68 (!) 118/59  Pulse:  95 95 84  Resp:   18 17  Temp:  98.6 F (37 C) 98.6 F (37 C) 98.5 F (36.9 C)  TempSrc:  Oral Oral Oral  SpO2:  94% 94% 96%  Weight: 68 kg     Height:        Intake/Output Summary (Last 24 hours) at 08/11/2021  [place] filed at 08/11/2021 0506 Gross per 24 hour  Intake 120 ml  Output 1900 ml  Net -1780 ml    Filed Weights   08/07/21 0515 08/08/21 0355 08/11/21 0500  Weight: 64.6 kg 64.5 kg 68 kg    Examination:  General exam: NAD Respiratory system; CTA Cardiovascular  system: S 1, S 2 RRR Gastrointestinal system: BS present, soft, tenderness lower quadrant. incision with dressing Central nervous system: alert  Extremities: trace edema.     Data Reviewed: I have personally reviewed following labs and imaging studies  CBC: Recent Labs  Lab 08/06/21 0033 08/07/21 0922 08/08/21 0852 08/09/21 0715 08/10/21 0833  WBC 11.7* 13.4* 9.2 7.8 6.3  HGB 11.3* 12.2* 10.6* 10.3* 11.3*  HCT 37.1* 39.8 33.6* 33.4* 35.4*  MCV 91.6 92.3 91.6 92.0 89.2  PLT 165 208 159 148* 960    Basic Metabolic Panel: Recent Labs  Lab 08/05/21 0114 08/06/21 0033 08/07/21 0922 08/08/21 0852 08/09/21 0715 08/10/21 0833  NA 140 140 138 138 136 136  K 4.1 4.6 4.4 4.5 3.7 3.9  CL 114* 112* 110 108 108 103  CO2 20* 22 22 23 22 24   GLUCOSE 153* 248* 118* 105* 147* 149*  BUN 40* 35* 34* 31* 29* 26*  CREATININE 1.74* 1.77* 1.97* 1.97* 1.81* 1.74*  CALCIUM 8.3* 8.2* 8.3* 7.8* 7.9* 8.1*  MG 2.3 2.2 2.3  --   --   --     GFR: Estimated Creatinine Clearance: 22.3 mL/min (A) (by C-G formula based on SCr of 1.74 mg/dL (H)). Liver Function Tests: Recent Labs  Lab 08/06/21 0033  AST 15  ALT 8  ALKPHOS 61  BILITOT 0.5  PROT 5.0*  ALBUMIN 2.7*    No results for input(s): LIPASE, AMYLASE in the last 168 hours.  No results for input(s): AMMONIA in the last 168 hours. Coagulation Profile: No results for input(s): INR, PROTIME in the last 168 hours. Cardiac Enzymes: No results for input(s): CKTOTAL, CKMB, CKMBINDEX, TROPONINI in the last 168 hours. BNP (last 3 results) No results for input(s): PROBNP in the last 8760 hours. HbA1C: No results for input(s): HGBA1C in the last 72 hours.  CBG: Recent Labs  Lab 08/10/21 0806 08/10/21 1119 08/10/21 1811 08/10/21 2049 08/11/21 0725  GLUCAP 122* 141* 150* 127* 113*    Lipid Profile: No results for input(s): CHOL, HDL, LDLCALC, TRIG, CHOLHDL, LDLDIRECT in the last 72 hours. Thyroid Function Tests: No results for  input(s): TSH, T4TOTAL, FREET4, T3FREE, THYROIDAB in the last 72 hours. Anemia Panel: No results for input(s): VITAMINB12, FOLATE, FERRITIN, TIBC, IRON, RETICCTPCT in the last 72 hours. Sepsis Labs: No results for input(s): PROCALCITON, LATICACIDVEN in the last 168 hours.   Recent Results (from the past 240 hour(s))  Resp Panel by RT-PCR (Flu A&B, Covid) Nasopharyngeal Swab     Status: None   Collection Time: 08/02/21 12:16 PM   Specimen: Nasopharyngeal Swab; Nasopharyngeal(NP) swabs in vial transport medium  Result Value Ref Range Status   SARS Coronavirus 2 by RT PCR NEGATIVE NEGATIVE Final    Comment: (NOTE) SARS-CoV-2 target nucleic acids are NOT DETECTED.  The SARS-CoV-2 RNA is generally detectable in upper respiratory specimens during the acute phase of infection. The lowest concentration of SARS-CoV-2 viral copies this assay can detect is 138 copies/mL. A negative result does not preclude SARS-Cov-2 infection and should not be used as the sole basis for treatment or other patient management decisions. A negative result may occur with  improper specimen  collection/handling, submission of specimen other than nasopharyngeal swab, presence of viral mutation(s) within the areas targeted by this assay, and inadequate number of viral copies(<138 copies/mL). A negative result must be combined with clinical observations, patient history, and epidemiological information. The expected result is Negative.  Fact Sheet for Patients:  EntrepreneurPulse.com.au  Fact Sheet for Healthcare Providers:  IncredibleEmployment.be  This test is no t yet approved or cleared by the [place] FDA and  has been authorized for detection and/or diagnosis of SARS-CoV-2 by FDA under an Emergency Use Authorization (EUA). This EUA will remain  in effect (meaning this test can be used) for the duration of the COVID-19 declaration under Section 564(b)(1) of the Act,  21 U.S.C.section 360bbb-3(b)(1), unless the authorization is terminated  or revoked sooner.       Influenza A by PCR NEGATIVE NEGATIVE Final   Influenza B by PCR NEGATIVE NEGATIVE Final    Comment: (NOTE) The Xpert Xpress SARS-CoV-2/FLU/RSV plus assay is intended as an aid in the diagnosis of influenza from Nasopharyngeal swab specimens and should not be used as a sole basis for treatment. Nasal washings and aspirates are unacceptable for Xpert Xpress SARS-CoV-2/FLU/RSV testing.  Fact Sheet for Patients: EntrepreneurPulse.com.au  Fact Sheet for Healthcare Providers: IncredibleEmployment.be  This test is not yet approved or cleared by the [place] FDA and has been authorized for detection and/or diagnosis of SARS-CoV-2 by FDA under an Emergency Use Authorization (EUA). This EUA will remain in effect (meaning this test can be used) for the duration of the COVID-19 declaration under Section 564(b)(1) of the Act, 21 U.S.C. section 360bbb-3(b)(1), unless the authorization is terminated or revoked.  Performed at [facility], [place] [address] [place], [place] [address]   Surgical pcr screen     Status: None   Collection Time: 08/04/21  9:40 PM   Specimen: Nasal Mucosa; Nasal Swab  Result Value Ref Range Status   MRSA, PCR NEGATIVE NEGATIVE Final   Staphylococcus aureus NEGATIVE NEGATIVE Final    Comment: (NOTE) The Xpert SA Assay (FDA approved for NASAL specimens in patients 59 years of age and older), is one component of a comprehensive surveillance program. It is not intended to diagnose infection nor to guide or monitor treatment. Performed at [facility], [place] [address] [place], [place] [address]           Radiology Studies: No results found.      Scheduled Meds:  acetaminophen  500 mg Oral BID   aspirin EC  81 mg Oral QODAY   enoxaparin (LOVENOX) injection  30 mg Subcutaneous Q24H   feeding supplement   237 mL Oral BID BM   furosemide  20 mg Oral Daily   insulin aspart  0-5 Units Subcutaneous QHS   insulin aspart  0-6 Units Subcutaneous TID WC   levothyroxine  50 mcg Oral QAC breakfast   lidocaine  1 patch Transdermal Q24H   metoprolol tartrate  12.5 mg Oral BID   multivitamin with minerals  1 tablet Oral Daily   pantoprazole (PROTONIX) IV  40 mg Intravenous Q12H   tamsulosin  0.4 mg Oral Daily   Continuous Infusions:  methocarbamol (ROBAXIN) IV       LOS: 8 days    Time spent: 35 minutes    Sunny Aguon A Kerby Hockley, MD [facility]   If 7PM-7AM, please contact night-coverage [email or web address]  08/11/2021, 8:53 AM

## 2021-08-11 NOTE — Progress Notes (Signed)
Nutrition Follow-up  DOCUMENTATION CODES:   Severe malnutrition in context of chronic illness  INTERVENTION:   Continue Multivitamin w/ minerals daily  Increase Ensure Enlive po TID, each supplement provides 350 kcal and 20 grams of protein  30 ml ProSource Plus BID, each supplement provides 100 kcals and 15 grams protein.   Encourage good PO intake  NUTRITION DIAGNOSIS:   Severe Malnutrition related to chronic illness (CHF, CKD IV) as evidenced by severe muscle depletion, severe fat depletion. - Ongoing   GOAL:   Patient will meet greater than or equal to 90% of their needs - Progressing   MONITOR:   PO intake, Supplement acceptance, Weight trends, Labs  REASON FOR ASSESSMENT:   Consult Assessment of nutrition requirement/status, Diet education  ASSESSMENT:   87 y.o. male presented to the ED with abdominal pain, poor appetite, N/V, and weight loss. Pt recently discharged from [facility] in September with similar symptoms and found to have an ileus. PMH includes CKD IV, CHF, anemia, and HTN. Pt admitted with small bowel obstruction.   10/30 - NPO 10/31 - Clear Liquid Diet 11/02 - NPO; OR for laparoscopic assisted R hemicolectomy, pt found with a large obstructing mass at the ileocecal valve (pathology confirmed metastatic invasive adenocarcinoma; plan for outpatient oncology/med) 11/03 - Clear Liquid Diet 11/06 - SOFT diet  Pt resting in bed, looks physically distressed. Pt reports that he is in pain. Pt family member at bedside.   Per family member, they requested Ensure but were told they were out of them. Per family, pt is unable to feed himself and that she fed him breakfast. Pt unable to report on appetite or intake.  Per EMR, pt intake includes: 11/06 - Lunch 20%, Dinner 20% 11/07 - Dinner 75%  Per RN, she believes that he has been eating fine. RN also reports that pt is getting Ensure, only likes the chocolate. Reports that she gave him one this morning.    Medications reviewed and include: Colace, Lasix, SSI 0-5 units daily + 0-6 units TID, Levothyroxine, MVI, Protonix, Miralax, IV antibiotic Labs reviewed: 24 hr BG trends 113-150  Admission Weight: 63.7 kg Current Weight: 68 kg  Diet Order:   Diet Order             DIET SOFT Room service appropriate? Yes; Fluid consistency: Thin  Diet effective now                   EDUCATION NEEDS:   No education needs have been identified at this time  Skin:  Skin Assessment: Skin Integrity Issues: Skin Integrity Issues:: Incisions Incisions: Abdomen  Last BM:  08/10/2021  Height:   Ht Readings from Last 1 Encounters:  08/05/21 5\' 11"  (1.803 m)    Weight:   Wt Readings from Last 1 Encounters:  08/11/21 68 kg    Ideal Body Weight:  78.2 kg  BMI:  Body mass index is 20.91 kg/m.  Estimated Nutritional Needs:   Kcal:  1900-2100  Protein:  95-110 grams  Fluid:  >/= 1.9 L    Ramina Hulet BS, PLDN Clinical Dietitian See AMiON for contact information.

## 2021-08-11 NOTE — Progress Notes (Signed)
Progress Note  6 Days Post-Op  Subjective: Continues to tolerate soft diet without nausea or emesis. Last BM 2 days ago. Still needing IV pain meds but has not really tried oral options and pain is controlled with medication.   Granddaughter is bedside.  Objective: Vital signs in last 24 hours: Temp:  [98.3 F (36.8 C)-98.7 F (37.1 C)] 98.5 F (36.9 C) (11/08 0741) Pulse Rate:  [65-95] 84 (11/08 0741) Resp:  [16-18] 17 (11/08 0741) BP: (113-140)/(56-68) 118/59 (11/08 0741) SpO2:  [94 %-100 %] 96 % (11/08 0741) Weight:  [68 kg] 68 kg (11/08 0500) Last BM Date: 08/10/21  Intake/Output from previous day: 11/07 0701 - 11/08 0700 In: 120 [P.O.:120] Out: 1900 [Urine:1900] Intake/Output this shift: No intake/output data recorded.  PE: General: pleasant, WD, male who is sitting up in chair in NAD HEENT: head is normocephalic, atraumatic.    Mouth is pink and moist Heart: regular, rate, and rhythm.  Palpable radial pulses bilaterally Lungs: CTAB  Respiratory effort nonlabored Abd: soft, ND, +BS, mild TTP over incisions without rebound or guarding. Incisions c/d/I with glue intact. Midline incision c/d/I. No erythema or discharge.  MSK: all 4 extremities are symmetrical with no cyanosis, clubbing, or edema. No calf TTP bilaterally Skin: warm and dry with no masses, lesions, or rashes Psych: A&Ox3 with an appropriate affect.    Lab Results:  Recent Labs    08/09/21 0715 08/10/21 0833  WBC 7.8 6.3  HGB 10.3* 11.3*  HCT 33.4* 35.4*  PLT 148* 174    BMET Recent Labs    08/09/21 0715 08/10/21 0833  NA 136 136  K 3.7 3.9  CL 108 103  CO2 22 24  GLUCOSE 147* 149*  BUN 29* 26*  CREATININE 1.81* 1.74*  CALCIUM 7.9* 8.1*    PT/INR No results for input(s): LABPROT, INR in the last 72 hours. CMP     Component Value Date/Time   NA 136 08/10/2021 0833   NA 140 01/20/2017 1145   K 3.9 08/10/2021 0833   CL 103 08/10/2021 0833   CO2 24 08/10/2021 0833   GLUCOSE  149 (H) 08/10/2021 0833   BUN 26 (H) 08/10/2021 0833   BUN 36 01/20/2017 1145   CREATININE 1.74 (H) 08/10/2021 0833   CREATININE 1.78 (H) 12/16/2016 1110   CALCIUM 8.1 (L) 08/10/2021 0833   PROT 5.0 (L) 08/06/2021 0033   ALBUMIN 2.7 (L) 08/06/2021 0033   AST 15 08/06/2021 0033   ALT 8 08/06/2021 0033   ALKPHOS 61 08/06/2021 0033   BILITOT 0.5 08/06/2021 0033   GFRNONAA 35 (L) 08/10/2021 0833   GFRAA 27 (L) 10/22/2019 1116   Lipase     Component Value Date/Time   LIPASE 26 08/02/2021 0636       Studies/Results: No results found.  Anti-infectives: Anti-infectives (From admission, onward)    Start     Dose/Rate Route Frequency Ordered Stop   08/05/21 0921  sodium chloride 0.9 % with cefoTEtan (CEFOTAN) ADS Med       Note to Pharmacy: Gregery Na   : cabinet override      08/05/21 0921 08/05/21 1129   08/04/21 1100  cefoTEtan (CEFOTAN) 2 g in sodium chloride 0.9 % 100 mL IVPB  Status:  Discontinued        2 g 200 mL/hr over 30 Minutes Intravenous On call to O.R. 08/04/21 1001 08/05/21 0559        Assessment/Plan  Bowel obstruction secondary to cecal mass  POD#6 S/P  LAPAROSCOPIC ASSISTED RIGHT HEMICOLECTOMY 08/06/21 Dr. 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 Negative margins. Discussed with patient, granddaughter as well as daughter via phone. Will need to see med/onc outpatient - tolerating soft diet well and has had BMs - IS and pulm toilet  - PT/OT  - hopefully discharge soon. Agree with tramadol and have added oxycodone 5mg  as well for pain control. Wean IV pain meds - miralax and colace added for bowel regimen   ID - none FEN - soft, lasix 20 mg po daily  Check BMET  VTE - SCDs, lovenox Foley - D/C   HTN PAF not on anticoagulation CHF CKD-IV Hypothyroidism First degree heart block BPH Code status DNR     LOS: 8 days    Winferd Humphrey,  [facility] 08/11/2021, 7:41 AM Please see Amion for pager number during day hours 7:00am-4:30pm

## 2021-08-11 NOTE — Progress Notes (Signed)
Physical Therapy Treatment Patient Details Name: Nathan Kim MRN: [id number] DOB: [birth year] Today's Date: 08/11/2021   History of Present Illness Pt adm 10/30 with abdominal pain, N/V, and weight loss. Pt thought to have partial SBO. 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 PMH - back surgery, rt THR, ckd, arthritis, afib    PT Comments    Patient not progressing much today due to pain. Tolerated bed mobility, transfers and taking a few steps to sit up in chair to eat lunch. Reports he has been getting up with nursing a few times to the [facility] to have a BM and is not having the best day. Pleasant and cooperative. Will plan to increase ambulation and stair training next session to prepare for d/c home. Will follow.   Recommendations for follow up therapy are one component of a multi-disciplinary discharge planning process, led by the attending physician.  Recommendations may be updated based on patient status, additional functional criteria and insurance authorization.  Follow Up Recommendations  Home health PT     Assistance Recommended at Discharge Intermittent Supervision/Assistance  Equipment Recommendations  None recommended by PT    Recommendations for Other Services       Precautions / Restrictions Precautions Precautions: Fall Precaution Comments: abdominal precautions for comfort Restrictions Weight Bearing Restrictions: No     Mobility  Bed Mobility Overal bed mobility: Needs Assistance Bed Mobility: Rolling;Sidelying to Sit Rolling: Supervision Sidelying to sit: Min guard;HOB elevated       General bed mobility comments: Use of rails to get to EOB.    Transfers Overall transfer level: Needs assistance Equipment used: Rolling walker (2 wheels) Transfers: Sit to/from Stand Sit to Stand: Min guard;Min assist Stand pivot transfers: Min assist         General transfer comment: Min  guard-Min A to stand from EOB x1, from [facility] x1, transferred to chair post ambulation. SPT bed to [facility] Min A.    Ambulation/Gait Ambulation/Gait assistance: Min assist Gait Distance (Feet): 5 Feet Assistive device: Rolling walker (2 wheels) Gait Pattern/deviations: Step-through pattern;Trunk flexed Gait velocity: decreased     General Gait Details: Able to take a few steps to get to chair with Min A for RW management/balance. Wanted to eat lunch, limited by pain.   Stairs             Wheelchair Mobility    Modified Rankin (Stroke Patients Only)       Balance Overall balance assessment: Mild deficits observed, not formally tested Sitting-balance support: Feet supported;No upper extremity supported Sitting balance-Leahy Scale: Good     Standing balance support: During functional activity Standing balance-Leahy Scale: Poor Standing balance comment: reliant on UE support, total A for pericare                            Cognition Arousal/Alertness: Awake/alert Behavior During Therapy: WFL for tasks assessed/performed Overall Cognitive Status: Within Functional Limits for tasks assessed                                 General Comments: HOH        Exercises      General Comments General comments (skin integrity, edema, etc.): Daughter present during session.      Pertinent Vitals/Pain Pain Assessment: Faces Faces Pain Scale: Hurts even more Pain Location: abdomen  with mobility Pain Descriptors / Indicators: Guarding;Aching;Sore Pain Intervention(s): Premedicated before session;Monitored during session;Repositioned;Limited activity within patient's tolerance    Home Living                          Prior Function            PT Goals (current goals can now be found in the care plan section) Progress towards PT goals: Not progressing toward goals - comment (pain limiting)    Frequency    Min 3X/week      PT Plan  Current plan remains appropriate    Co-evaluation              AM-PAC PT "6 Clicks" Mobility   Outcome Measure  Help needed turning from your back to your side while in a flat bed without using bedrails?: A Little Help needed moving from lying on your back to sitting on the side of a flat bed without using bedrails?: A Little Help needed moving to and from a bed to a chair (including a wheelchair)?: A Little Help needed standing up from a chair using your arms (e.g., wheelchair or bedside chair)?: A Little Help needed to walk in hospital room?: A Little Help needed climbing 3-5 steps with a railing? : A Little 6 Click Score: 18    End of Session Equipment Utilized During Treatment: Gait belt Activity Tolerance: Patient limited by pain Patient left: in chair;with call bell/phone within reach;with family/visitor present Nurse Communication: Mobility status PT Visit Diagnosis: Other abnormalities of gait and mobility (R26.89);Muscle weakness (generalized) (M62.81);Pain Pain - part of body:  (abdomen)     Time: 5726-2035 PT Time Calculation (min) (ACUTE ONLY): 20 min  Charges:  $Therapeutic Activity: 8-22 mins                     Marisa Severin, PT, DPT Acute Rehabilitation Services Pager [phone] Office [phone]      Marguarite Arbour A Sabra Heck 08/11/2021, 2:51 PM

## 2021-08-12 ENCOUNTER — Ambulatory Visit: Payer: Medicare Other | Admitting: Gastroenterology

## 2021-08-12 DIAGNOSIS — N184 Chronic kidney disease, stage 4 (severe): Secondary | ICD-10-CM

## 2021-08-12 DIAGNOSIS — E43 Unspecified severe protein-calorie malnutrition: Secondary | ICD-10-CM

## 2021-08-12 DIAGNOSIS — I5032 Chronic diastolic (congestive) heart failure: Secondary | ICD-10-CM

## 2021-08-12 DIAGNOSIS — C189 Malignant neoplasm of colon, unspecified: Secondary | ICD-10-CM

## 2021-08-12 DIAGNOSIS — I480 Paroxysmal atrial fibrillation: Secondary | ICD-10-CM

## 2021-08-12 DIAGNOSIS — I10 Essential (primary) hypertension: Secondary | ICD-10-CM

## 2021-08-12 LAB — GLUCOSE, CAPILLARY: Glucose-Capillary: 128 mg/dL — ABNORMAL HIGH (ref 70–99)

## 2021-08-12 MED ORDER — OXYCODONE HCL 5 MG PO TABS: 5.0000 mg | ORAL_TABLET | ORAL | 0 refills | Status: AC | PRN

## 2021-08-12 MED ORDER — ENSURE ENLIVE PO LIQD: 237.0000 mL | Freq: Three times a day (TID) | ORAL | 0 refills | Status: AC

## 2021-08-12 MED ORDER — SENNOSIDES-DOCUSATE SODIUM 8.6-50 MG PO TABS: 1.0000 | ORAL_TABLET | Freq: Two times a day (BID) | ORAL | 0 refills | Status: DC | PRN

## 2021-08-12 MED ORDER — ACETAMINOPHEN 500 MG PO TABS: 500.0000 mg | ORAL_TABLET | Freq: Two times a day (BID) | ORAL | Status: DC

## 2021-08-12 MED ORDER — ADULT MULTIVITAMIN W/MINERALS CH: 1.0000 | ORAL_TABLET | Freq: Every day | ORAL | Status: DC

## 2021-08-12 NOTE — Progress Notes (Signed)
Patient discharged to home with daughter with all belongings and bedside commode via wheelchair.

## 2021-08-12 NOTE — Discharge Summary (Signed)
Physician Discharge Summary  Nathan Kim FKC:[id number] DOB: 06/26/1920 DOA: 08/02/2021  PCP: [name], MD  Admit date: 08/02/2021 Discharge date: 08/12/2021 Admitted From: Home Disposition: Home Recommendations for Outpatient Follow-up:  Follow ups as below. Please obtain CBC/BMP/Mag at follow up Please follow up on the following pending results: None Home Health: PT/OT Equipment/Devices: Not indicated Discharge Condition: Stable but guarded prognosis CODE STATUS: DNR/DNI  Follow-up Information     [facility] Follow up.   Specialty: Home Health Services Contact information: [place] [address] [phone]         Erroll Luna, MD. Go on 08/24/2021.   Specialty: General Surgery Why: Your appointment is 11/21 at 10:50am Please arrive 30 minutes prior to your appointment to check in and fill out paperwork. Bring photo ID and insurance information. Contact information: [place] [address] [phone]                Hospital Course: 87 year old M with PMH of diastolic CHF, CKD-4, PAF, HTN, first-degree AVB, anemia and hypothyroidism presenting with nausea, vomiting, decreased appetite and abdominal pain, and admitted with small bowel obstruction in the setting of near obstructing cecal mass.  He underwent laparoscopic laparotomy on 08/05/2021 and found to have near obstructing cancer of the cecum.  He underwent right hemicolectomy.  Pathology returned with invasive moderately differentiated adenocarcinoma with mucinous features with metastasis to 3 of 8 lymph nodes.  Eventually, his symptoms improved and he tolerated diet.  Pain controlled on p.o. medication.  He is cleared for discharge by general surgery for outpatient follow-up.  Ambulatory referral to oncology ordered as well.  Home health PT/OT ordered as recommended by therapy.  See individual problem list below for more on hospital  course.  Discharge Diagnoses:  SBO due to metastatic adenocarcinoma of colon -S/p laparoscopic laparotomy and right hemicolectomy  -Pathology with invasive moderately differentiated adenocarcinoma with mucinous feature involving cecum and proximal ascending colon, focally invading into the serosal surface and with metastasis to 3 of 8 lymph nodes. -Tylenol and as needed oxycodone for pain -Bowel regimen for constipation -Outpatient follow-up with general surgery as above -Ambulatory referral to oncology but not sure if there is much to offer given his age  History of paroxysmal atrial fibrillation -Continue home meds  CKD-3B/azotemia: Stable. Recent Labs    08/02/21 0636 08/03/21 0125 08/04/21 0100 08/05/21 0114 08/06/21 0033 08/07/21 1638 08/08/21 0852 08/09/21 0715 08/10/21 0833 08/11/21 0900  BUN 61* 50* 45* 40* 35* 34* 31* 29* 26* 24*  CREATININE 2.13* 1.79* 1.73* 1.74* 1.77* 1.97* 1.97* 1.81* 1.74* 1.70*   Chronic diastolic CHF: Appears euvolemic off home Lasix.   -Decreased home Lasix.   Hypertension: on metoprolol and Lasix  Hypothyroidism: on Synthroid  BPH ; Flomax  Severe malnutrition Body mass index is 20.85 kg/m. Nutrition Problem: Severe Malnutrition Etiology: chronic illness (CHF, CKD IV) Signs/Symptoms: severe muscle depletion, severe fat depletion Interventions: Ensure Enlive (each supplement provides 350kcal and 20 grams of protein), MVI     Discharge Exam: Vitals:   08/11/21 2058 08/11/21 2104 08/12/21 0427 08/12/21 0727  BP: 110/64 110/64 125/78 134/76  Pulse: 95 94 78 80  Temp:  98.3 F (36.8 C) 97.6 F (36.4 C) 97.9 F (36.6 C)  Resp:  18 18 17   Height:      Weight:   67.8 kg   SpO2: 96% 94% 97% 93%  TempSrc:  Oral Oral Oral  BMI (Calculated):  20.86      GENERAL: No apparent distress.  Nontoxic. HEENT: MMM.  Vision and hearing grossly intact.  NECK: Supple.  No apparent JVD.  RESP: On RA.  No IWOB.  Fair aeration  bilaterally. CVS:  RRR. Heart sounds normal.  ABD/GI/GU: Bowel sounds present. Soft. Non tender.  Surgical wound DCI. MSK/EXT:  Moves extremities. No apparent deformity. No edema.  SKIN: no apparent skin lesion or wound NEURO: Awake and alert.  Oriented appropriately.  No apparent focal neuro deficit. PSYCH: Calm. Normal affect.    Discharge Instructions  Discharge Instructions     Ambulatory referral to Hematology / Oncology   Complete by: As directed    Call MD for:  difficulty breathing, headache or visual disturbances   Complete by: As directed    Call MD for:  extreme fatigue   Complete by: As directed    Call MD for:  persistant nausea and vomiting   Complete by: As directed    Call MD for:  severe uncontrolled pain   Complete by: As directed    Diet general   Complete by: As directed    Discharge instructions   Complete by: As directed    It has been a pleasure taking care of you!  You were hospitalized with nausea, vomiting, abdominal pain and poor appetite likely due to colon cancer for which you were treated surgically medically.  Your symptoms improved to the point we think it is safe to let you go home and follow-up with your surgeon.  Please be cautious with oxycodone which could increase your risk of confusion, fall and constipation.  We recommend taking Senokot-S as needed for constipation.    Take care,   Increase activity slowly   Complete by: As directed    No wound care   Complete by: As directed       Allergies as of 08/12/2021   No Known Allergies      Medication List     TAKE these medications    acetaminophen 500 MG tablet Commonly known as: TYLENOL Take 1 tablet (500 mg total) by mouth 2 (two) times daily.   aspirin EC 81 MG tablet Take 81 mg by mouth every other day.   famotidine 20 MG tablet Commonly known as: PEPCID Take 20 mg by mouth daily.   feeding supplement Liqd Take 237 mLs by mouth 3 (three) times daily between meals.    furosemide 20 MG tablet Commonly known as: LASIX Take 20 mg daily alternating with 40 mg every other day What changed: Another medication with the same name was removed. Continue taking this medication, and follow the directions you see here.   levothyroxine 50 MCG tablet Commonly known as: SYNTHROID Take 50 mcg by mouth daily before breakfast.   metoprolol tartrate 25 MG tablet Commonly known as: LOPRESSOR TAKE ONE-HALF TABLET BY MOUTH TWICE DAILY. GENERIC EQUIVALENT FOR LOPRESSOR   multivitamin with minerals Tabs tablet Take 1 tablet by mouth daily. Start taking on: August 13, 2021   oxyCODONE 5 MG immediate release tablet Commonly known as: Oxy IR/ROXICODONE Take 1 tablet (5 mg total) by mouth every 4 (four) hours as needed for up to 5 days for moderate pain or severe pain.   senna-docusate 8.6-50 MG tablet Commonly known as: Senokot-S Take 1 tablet by mouth 2 (two) times daily between meals as needed for mild constipation.   tamsulosin 0.4 MG Caps capsule Commonly known as: FLOMAX Take 0.4 mg by mouth daily.   triamcinolone  cream 0.1 % Commonly known as: KENALOG Apply 1 application topically 2 (two) times daily as needed (itching).   UNABLE TO FIND Take 1 tablet by mouth every other day. Med Name: [place]  (From admission, onward)           Start     Ordered   08/09/21 1422  For home use only DME Bedside commode  Once       Question:  Patient needs a bedside commode to treat with the following condition  Answer:  Leg weakness   08/09/21 1421            Consultations: General surgery  Procedures/Studies: He underwent laparoscopic laparotomy on 08/05/2021 and found to have near obstructing cancer of the cecum.     CT ABDOMEN PELVIS WO CONTRAST  Result Date: 08/02/2021 CLINICAL DATA:  Abdominal pain EXAM: CT ABDOMEN AND PELVIS WITHOUT CONTRAST TECHNIQUE: Multidetector CT imaging of the  abdomen and pelvis was performed following the standard protocol without IV contrast. COMPARISON:  06/05/2021 FINDINGS: Lower chest: Coronary artery calcifications are seen Hepatobiliary: Liver measures 14 cm. No focal abnormality is seen. There is no dilation of bile ducts. Gallbladder is slightly distended. There is no significant wall thickening. Pancreas: There is atrophy. There are coarse calcifications, more so in the head of the pancreas suggesting chronic pancreatitis. There are no loculated fluid collections in or around the pancreas. Spleen: Unremarkable Adrenals/Urinary Tract: Adrenals are not enlarged. There is no hydronephrosis. There are few smooth marginated fluid density lesions in the left kidney largest measuring 4.5 cm. There are no renal or ureteral stones. Urinary bladder is partly obscured by beam hardening artifacts limiting evaluation. Stomach/Bowel: Stomach is not distended. Small diverticulum is noted along the inner margin of duodenum. There is mild dilation of distal small bowel loops measuring up to 3.2 cm. There is fluid in the distal small bowel loops. There is no significant distention of colon. Multiple diverticula are seen in colon without signs of diverticulitis. Vascular/Lymphatic: Extrinsic calcifications are seen in aorta and its major branches. No new significant lymphadenopathy is seen. Reproductive: Prostate is enlarged. Other: There is no pneumoperitoneum.  Small pelvic ascites is seen. Musculoskeletal: There is previous right hip arthroplasty. Degenerative changes are noted in lumbar spine and left hip. Slight decrease in the height of upper endplate of body of L2 vertebra has not changed. IMPRESSION: There is dilation of distal small bowel loops with fluid in the lumen. Findings suggest partial small bowel obstruction which may be due to adhesions or internal hernia. Small pelvic ascites is seen. There is no pneumoperitoneum. There is no hydronephrosis. Diverticulosis of  colon without signs of focal diverticulitis. Other findings as described in the body of the report. Electronically Signed   By: Elmer Picker M.D.   On: 08/02/2021 11:34   DG Abd Portable 1V-Small Bowel Obstruction Protocol-initial, 8 hr delay  Result Date: 08/03/2021 CLINICAL DATA:  Small bowel obstruction.  8 hour delay. EXAM: PORTABLE ABDOMEN - 1 VIEW COMPARISON:  Abdominopelvic CT yesterday. FINDINGS: Administered enteric contrast is seen within the hepatic flexure, descending, and sigmoid colon. There is mild persisting gaseous small bowel distention in the central abdomen. Right hip arthroplasty IMPRESSION: Administered enteric contrast within the colon. Mild persisting gaseous small bowel distention in the central abdomen. Electronically Signed   By: Keith Rake M.D.   On: 08/03/2021 21:04  The results of significant diagnostics from this hospitalization (including imaging, microbiology, ancillary and laboratory) are listed below for reference.     Microbiology: Recent Results (from the past 240 hour(s))  Surgical pcr screen     Status: None   Collection Time: 08/04/21  9:40 PM   Specimen: Nasal Mucosa; Nasal Swab  Result Value Ref Range Status   MRSA, PCR NEGATIVE NEGATIVE Final   Staphylococcus aureus NEGATIVE NEGATIVE Final    Comment: (NOTE) The Xpert SA Assay (FDA approved for NASAL specimens in patients 22 years of age and older), is one component of a comprehensive surveillance program. It is not intended to diagnose infection nor to guide or monitor treatment. Performed at [facility], [place] [address], [place], [place] [address]      Labs:  CBC: Recent Labs  Lab 08/06/21 0033 08/07/21 6045 08/08/21 0852 08/09/21 0715 08/10/21 0833  WBC 11.7* 13.4* 9.2 7.8 6.3  HGB 11.3* 12.2* 10.6* 10.3* 11.3*  HCT 37.1* 39.8 33.6* 33.4* 35.4*  MCV 91.6 92.3 91.6 92.0 89.2  PLT 165 208 159 148* 174   BMP &GFR Recent Labs  Lab 08/06/21 0033  08/07/21 0922 08/08/21 0852 08/09/21 0715 08/10/21 0833 08/11/21 0900  NA 140 138 138 136 136 136  K 4.6 4.4 4.5 3.7 3.9 4.2  CL 112* 110 108 108 103 101  CO2 22 22 23 22 24 26   GLUCOSE 248* 118* 105* 147* 149* 162*  BUN 35* 34* 31* 29* 26* 24*  CREATININE 1.77* 1.97* 1.97* 1.81* 1.74* 1.70*  CALCIUM 8.2* 8.3* 7.8* 7.9* 8.1* 8.0*  MG 2.2 2.3  --   --   --   --    Estimated Creatinine Clearance: 22.7 mL/min (A) (by C-G formula based on SCr of 1.7 mg/dL (H)). Liver & Pancreas: Recent Labs  Lab 08/06/21 0033  AST 15  ALT 8  ALKPHOS 61  BILITOT 0.5  PROT 5.0*  ALBUMIN 2.7*   No results for input(s): LIPASE, AMYLASE in the last 168 hours. No results for input(s): AMMONIA in the last 168 hours. Diabetic: No results for input(s): HGBA1C in the last 72 hours. Recent Labs  Lab 08/11/21 0725 08/11/21 1251 08/11/21 1658 08/11/21 2103 08/12/21 0727  GLUCAP 113* 131* 166* 122* 128*   Cardiac Enzymes: No results for input(s): CKTOTAL, CKMB, CKMBINDEX, TROPONINI in the last 168 hours. No results for input(s): PROBNP in the last 8760 hours. Coagulation Profile: No results for input(s): INR, PROTIME in the last 168 hours. Thyroid Function Tests: No results for input(s): TSH, T4TOTAL, FREET4, T3FREE, THYROIDAB in the last 72 hours. Lipid Profile: No results for input(s): CHOL, HDL, LDLCALC, TRIG, CHOLHDL, LDLDIRECT in the last 72 hours. Anemia Panel: No results for input(s): VITAMINB12, FOLATE, FERRITIN, TIBC, IRON, RETICCTPCT in the last 72 hours. Urine analysis:    Component Value Date/Time   COLORURINE YELLOW 08/02/2021 1300   APPEARANCEUR CLEAR 08/02/2021 1300   LABSPEC 1.015 08/02/2021 1300   PHURINE 5.0 08/02/2021 1300   GLUCOSEU NEGATIVE 08/02/2021 1300   HGBUR NEGATIVE 08/02/2021 [place] 08/02/2021 [place] 08/02/2021 [facility] [place] 08/02/2021 1300   NITRITE NEGATIVE 08/02/2021 [place]  08/02/2021 1300   Sepsis Labs: Invalid input(s): PROCALCITONIN, LACTICIDVEN   Time coordinating discharge: 50 minutes  SIGNED:  Mercy Riding, MD  [facility] 08/12/2021, 2:23 PM

## 2021-08-12 NOTE — Progress Notes (Signed)
Discharge instructions given to pt and pt daughter. Both verbalized understanding of all teaching.

## 2021-08-12 NOTE — Progress Notes (Signed)
Occupational Therapy Treatment Patient Details Name: Osamah Schmader MRN: [id number] DOB: [birth year] Today's Date: 08/12/2021   History of present illness Pt adm 10/30 with abdominal pain, N/V, and weight loss. Pt thought to have partial SBO. 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 PMH - back surgery, rt THR, ckd, arthritis, afib   OT comments  Pt eager to mobilize with therapy today, supervision for bed mobility, min guard - min A for BSC  step pivot transfer x2 using RW. Min A for clothing mgmt and pericare during toileting. Pt able to walk hallway distance with min guard using RW and chair follow for safety. Pt progressing well towards goals, still presentign with decreased balance and activity tolerance at this time. Will continue to follow, anticipate safe d/c home with [place].   Recommendations for follow up therapy are one component of a multi-disciplinary discharge planning process, led by the attending physician.  Recommendations may be updated based on patient status, additional functional criteria and insurance authorization.    Follow Up Recommendations  Home health OT    Assistance Recommended at Discharge Intermittent Supervision/Assistance  Equipment Recommendations  BSC/3in1    Recommendations for Other Services      Precautions / Restrictions Precautions Precautions: Fall Precaution Comments: abdominal precautions for comfort Restrictions Weight Bearing Restrictions: No       Mobility Bed Mobility Overal bed mobility: Needs Assistance Bed Mobility: Supine to Sit;Sit to Sidelying   Sidelying to sit: Supervision;HOB elevated Supine to sit: Supervision;HOB elevated     General bed mobility comments: uses rails to get to EOB, requires increased time    Transfers Overall transfer level: Needs assistance Equipment used: Rolling walker (2 wheels) Transfers: Sit to/from Stand;Bed to  chair/wheelchair/BSC Sit to Stand: Min guard;From elevated surface   Step pivot transfers: Min guard             Balance Overall balance assessment: Mild deficits observed, not formally tested Sitting-balance support: Feet supported;No upper extremity supported Sitting balance-Leahy Scale: Good     Standing balance support: During functional activity;Reliant on assistive device for balance Standing balance-Leahy Scale: Poor                             ADL either performed or assessed with clinical judgement   ADL Overall ADL's : Needs assistance/impaired                         Toilet Transfer: Min guard;Ambulation;Rolling walker (2 wheels) Toilet Transfer Details (indicate cue type and reason): transferred bed > BSC x2 Toileting- Clothing Manipulation and Hygiene: Minimal assistance;Sit to/from stand Toileting - Clothing Manipulation Details (indicate cue type and reason): had to adjust pt gown prior to sitting on BSC     Functional mobility during ADLs: Min guard General ADL Comments: pt notes pain, however eager to walk/move around in room and hallway space    Extremity/Trunk Assessment Upper Extremity Assessment Upper Extremity Assessment: Overall WFL for tasks assessed   Lower Extremity Assessment Lower Extremity Assessment: Defer to PT evaluation        Vision   Vision Assessment?: No apparent visual deficits   Perception Perception Perception: Within Functional Limits   Praxis Praxis Praxis: Not tested    Cognition Arousal/Alertness: Awake/alert Behavior During Therapy: WFL for tasks assessed/performed Overall Cognitive Status: Within Functional Limits for tasks assessed  Exercises     Shoulder Instructions       General Comments daughter present throughout session    Pertinent Vitals/ Pain       Pain Assessment: Faces Pain Score: 5  Faces Pain Scale: Hurts  even more Pain Location: abdomen Pain Descriptors / Indicators: Discomfort Pain Intervention(s): Repositioned  Home Living                                          Prior Functioning/Environment              Frequency  Min 2X/week        Progress Toward Goals  OT Goals(current goals can now be found in the care plan section)  Progress towards OT goals: Progressing toward goals  Acute Rehab OT Goals Patient Stated Goal: return to PLOF OT Goal Formulation: With patient/family Time For Goal Achievement: 08/21/21 Potential to Achieve Goals: Good ADL Goals Pt Will Perform Grooming: with supervision;standing Pt Will Perform Lower Body Bathing: sit to/from stand;sitting/lateral leans;with min guard assist Pt Will Perform Lower Body Dressing: with min guard assist;sit to/from stand;sitting/lateral leans Pt Will Transfer to Toilet: with min guard assist;ambulating Pt Will Perform Tub/Shower Transfer: with min guard assist;Tub transfer;ambulating;rolling walker  Plan Discharge plan remains appropriate;Frequency remains appropriate    Co-evaluation                 AM-PAC OT "6 Clicks" Daily Activity     Outcome Measure   Help from another person eating meals?: None Help from another person taking care of personal grooming?: None Help from another person toileting, which includes using toliet, bedpan, or urinal?: A Little Help from another person bathing (including washing, rinsing, drying)?: A Lot Help from another person to put on and taking off regular upper body clothing?: A Little Help from another person to put on and taking off regular lower body clothing?: A Little 6 Click Score: 19    End of Session Equipment Utilized During Treatment: Gait belt;Rolling walker (2 wheels)  OT Visit Diagnosis: Unsteadiness on feet (R26.81);Other abnormalities of gait and mobility (R26.89);Muscle weakness (generalized) (M62.81);Pain   Activity Tolerance  Patient tolerated treatment well   Patient Left in chair;with chair alarm set;with family/visitor present;with nursing/sitter in room   Nurse Communication Mobility status;Other (comment) (NT notified to reconnect pt to catheter)        Time: 0921-0959 OT Time Calculation (min): 38 min  Charges: OT General Charges $OT Visit: 1 Visit OT Treatments $Self Care/Home Management : 23-37 mins $Therapeutic Activity: 8-22 mins  Lynnda Child, OTD, OTR/L Acute Rehab [phone]) [phone] - [place] 08/12/2021, 12:19 PM

## 2021-08-12 NOTE — Progress Notes (Signed)
Progress Note  7 Days Post-Op  Subjective: Pain is stable and tolerable with oral medication. Tolerating diet. Has had 2 BM in last 24 hours.   daughter is bedside.  Objective: Vital signs in last 24 hours: Temp:  [97.6 F (36.4 C)-98.3 F (36.8 C)] 97.9 F (36.6 C) (11/09 0727) Pulse Rate:  [78-95] 80 (11/09 0727) Resp:  [14-18] 17 (11/09 0727) BP: (110-134)/(61-78) 134/76 (11/09 0727) SpO2:  [91 %-97 %] 93 % (11/09 0727) Weight:  [67.8 kg] 67.8 kg (11/09 0427) Last BM Date: 08/12/21  Intake/Output from previous day: 11/08 0701 - 11/09 0700 In: 270 [P.O.:270] Out: 650 [Urine:650] Intake/Output this shift: Total I/O In: 240 [P.O.:240] Out: 400 [Urine:400]  PE: General: pleasant, WD, male who is laying in bed in NAD HEENT: head is normocephalic, atraumatic.    Mouth is pink and moist Heart: regular, rate, and rhythm.  Palpable radial pulses bilaterally Lungs: CTAB  Respiratory effort nonlabored Abd: soft, ND, +BS, mild TTP over incisions without rebound or guarding. Incisions c/d/I with glue intact. Midline incision c/d/I. No erythema or discharge.  MSK: all 4 extremities are symmetrical with no cyanosis, clubbing, or edema. No calf TTP bilaterally Skin: warm and dry with no masses, lesions, or rashes Psych: A&Ox3 with an appropriate affect.    Lab Results:  Recent Labs    08/10/21 0833  WBC 6.3  HGB 11.3*  HCT 35.4*  PLT 174    BMET Recent Labs    08/10/21 0833 08/11/21 0900  NA 136 136  K 3.9 4.2  CL 103 101  CO2 24 26  GLUCOSE 149* 162*  BUN 26* 24*  CREATININE 1.74* 1.70*  CALCIUM 8.1* 8.0*    PT/INR No results for input(s): LABPROT, INR in the last 72 hours. CMP     Component Value Date/Time   NA 136 08/11/2021 0900   NA 140 01/20/2017 1145   K 4.2 08/11/2021 0900   CL 101 08/11/2021 0900   CO2 26 08/11/2021 0900   GLUCOSE 162 (H) 08/11/2021 0900   BUN 24 (H) 08/11/2021 0900   BUN 36 01/20/2017 1145   CREATININE 1.70 (H)  08/11/2021 0900   CREATININE 1.78 (H) 12/16/2016 1110   CALCIUM 8.0 (L) 08/11/2021 0900   PROT 5.0 (L) 08/06/2021 0033   ALBUMIN 2.7 (L) 08/06/2021 0033   AST 15 08/06/2021 0033   ALT 8 08/06/2021 0033   ALKPHOS 61 08/06/2021 0033   BILITOT 0.5 08/06/2021 0033   GFRNONAA 36 (L) 08/11/2021 0900   GFRAA 27 (L) 10/22/2019 1116   Lipase     Component Value Date/Time   LIPASE 26 08/02/2021 0636       Studies/Results: No results found.  Anti-infectives: Anti-infectives (From admission, onward)    Start     Dose/Rate Route Frequency Ordered Stop   08/05/21 0921  sodium chloride 0.9 % with cefoTEtan (CEFOTAN) ADS Med       Note to Pharmacy: Gregery Na   : cabinet override      08/05/21 0921 08/05/21 1129   08/04/21 1100  cefoTEtan (CEFOTAN) 2 g in sodium chloride 0.9 % 100 mL IVPB  Status:  Discontinued        2 g 200 mL/hr over 30 Minutes Intravenous On call to O.R. 08/04/21 1001 08/05/21 0559        Assessment/Plan  Bowel obstruction secondary to cecal mass  POD#7 S/P LAPAROSCOPIC ASSISTED RIGHT HEMICOLECTOMY 08/06/21 Dr. 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 Negative margins. Discussed with patient, granddaughter as well as daughter via phone. Will need to see med/onc outpatient - tolerating soft diet well and has had BMs - IS and pulm toilet  - PT/OT  - Agree with tramadol and have added oxycodone 5mg  as well for pain control. Wean IV pain meds  Discharging today. I have sent pain medications. Follow up with Dr. Brantley Stage has been arranged   ID - none FEN - soft, lasix 20 mg po daily  Check BMET  VTE - SCDs, lovenox Foley - D/C   HTN PAF not on anticoagulation CHF CKD-IV Hypothyroidism First degree heart block BPH Code status DNR     LOS: 9 days    Nathan Kim, [facility] Surgery 08/12/2021, 11:23 AM Please see Amion for pager  number during day hours 7:00am-4:30pm

## 2021-08-16 LAB — SURGICAL PATHOLOGY

## 2021-08-31 ENCOUNTER — Ambulatory Visit: Payer: Medicare Other | Admitting: Gastroenterology

## 2021-10-22 ENCOUNTER — Other Ambulatory Visit: Payer: Self-pay | Admitting: Cardiology

## 2021-11-16 ENCOUNTER — Ambulatory Visit: Admitting: Cardiology

## 2021-11-16 ENCOUNTER — Encounter: Payer: Self-pay | Admitting: Cardiology

## 2021-11-16 VITALS — BP 114/56 | HR 51 | Ht 71.0 in | Wt 151.4 lb

## 2021-11-16 DIAGNOSIS — N1832 Chronic kidney disease, stage 3b: Secondary | ICD-10-CM

## 2021-11-16 DIAGNOSIS — I480 Paroxysmal atrial fibrillation: Secondary | ICD-10-CM | POA: Diagnosis not present

## 2021-11-16 DIAGNOSIS — I5032 Chronic diastolic (congestive) heart failure: Secondary | ICD-10-CM

## 2021-11-16 NOTE — Patient Instructions (Signed)
Medication Instructions:  Your physician recommends that you continue on your current medications as directed. Please refer to the Current Medication list given to you today.   Labwork: BMET this week at [facility]  Testing/Procedures: None today  Follow-Up: 6 months  Any Other Special Instructions Will Be Listed Below (If Applicable).  If you need a refill on your cardiac medications before your next appointment, please call your pharmacy.

## 2021-11-16 NOTE — Progress Notes (Signed)
Cardiology Office Note  Date: 11/16/2021   ID: Nathan Kim, DOB [birth year], MRN [id number]  PCP:  [name], MD  Cardiologist:  Rozann Lesches, MD Electrophysiologist:  None   Chief Complaint  Patient presents with   Cardiac follow-up    History of Present Illness: Nathan Kim is a 88 y.o. male last seen in September 2022.  He is here today with his wife and daughter.  He was hospitalized in November 2022 with small bowel obstruction and finding of obstructing cecal mass.  He ultimately underwent laparoscopic right hemicolectomy with pathology showing invasive moderately differentiated adenocarcinoma with evidence of metastasis to 3 of 8 lymph nodes.  Appetite has improved since then.  He has also had trouble with prostatic hypertrophy and fluid retention, now on higher dose Lasix to keep his weight stable.  He is using compression stockings for leg swelling.  He has not had interval lab work, creatinine 1.7 and potassium 4.2 as of November.  He did have worsening renal insufficiency in the interim.  Last echocardiogram was in 2020 at which point LVEF was 55 to 60%.  Past Medical History:  Diagnosis Date   Arthritis    BPH (benign prostatic hyperplasia)    CKD (chronic kidney disease) stage 4, GFR 15-29 ml/min (HCC)    CKD (chronic kidney disease), stage IV ([place]) 06/05/2021   Essential hypertension    Family history of adverse reaction to anesthesia    Hypothyroidism 06/06/2021   Paroxysmal atrial fibrillation ([place])    Phlebitis 10/2016    Past Surgical History:  Procedure Laterality Date   BACK SURGERY     carpel tunnel     EYE SURGERY     HERNIA REPAIR     LAPAROSCOPY N/A 08/05/2021   Procedure: LAPAROSCOPIC ASSISTED RIGHT COLON RESECTION;  Surgeon: Erroll Luna, MD;  Location: [facility];  Service: General;  Laterality: N/A;   LAPAROTOMY N/A 08/05/2021   Procedure: EXPLORATORY LAPAROTOMY;  Surgeon: Erroll Luna, MD;  Location: [facility];  Service: General;   Laterality: N/A;   LUMBAR LAMINECTOMY/DECOMPRESSION MICRODISCECTOMY N/A 02/27/2016   Procedure: LUMBAR LAMINECTOMY/DECOMPRESSION MICRODISCECTOMY 3 LEVELS;  Surgeon: Kristeen Miss, MD;  Location: [facility];  Service: Neurosurgery;  Laterality: N/A;  L2-3 L3-4 L4-5 Laminectomy   TOTAL HIP ARTHROPLASTY Right 05/24/2017   Procedure: RIGHT TOTAL HIP ARTHROPLASTY ANTERIOR APPROACH;  Surgeon: Paralee Cancel, MD;  Location: [facility];  Service: Orthopedics;  Laterality: Right;  70 mins    Current Outpatient Medications  Medication Sig Dispense Refill   acetaminophen (TYLENOL) 500 MG tablet Take 1 tablet (500 mg total) by mouth 2 (two) times daily.     aspirin EC 81 MG tablet Take 81 mg by mouth every other day.      finasteride (PROSCAR) 5 MG tablet Take 1 tablet by mouth daily.     furosemide (LASIX) 20 MG tablet TAKE 1 TABLET BY MOUTH EVERY OTHER DAY ALTERNATING  WITH  40  MG  EVERY  OTHER  DAY (Patient taking differently: Take 40 mg by mouth 3 (three) times daily. BUT occasionally does less depending on his weight, sob, & swelling) 45 tablet 2   levothyroxine (SYNTHROID) 50 MCG tablet Take 50 mcg by mouth daily before breakfast.     metoprolol tartrate (LOPRESSOR) 25 MG tablet TAKE ONE-HALF TABLET BY MOUTH TWICE DAILY. GENERIC EQUIVALENT FOR LOPRESSOR 90 tablet 3   potassium chloride (KLOR-CON) 10 MEQ tablet Take 10 mEq by mouth daily.     senna-docusate (SENOKOT-S) 8.6-50 MG  tablet Take 1 tablet by mouth 2 (two) times daily between meals as needed for mild constipation. 180 tablet 0   tamsulosin (FLOMAX) 0.4 MG CAPS capsule Take 0.8 mg by mouth daily.     triamcinolone cream (KENALOG) 0.1 % Apply 1 application topically 2 (two) times daily as needed (itching).     UNABLE TO FIND Take 1 tablet by mouth every other day. Med Name: [place]     No current facility-administered medications for this visit.   Allergies:  Patient has no known allergies.   ROS: Hearing loss.  Physical  Exam: VS:  BP (!) 114/56    Pulse (!) 51    Ht 5\' 11"  (1.803 m)    Wt 151 lb 6.4 oz (68.7 kg)    SpO2 99%    BMI 21.12 kg/m , BMI Body mass index is 21.12 kg/m.  Wt Readings from Last 3 Encounters:  11/16/21 151 lb 6.4 oz (68.7 kg)  08/12/21 149 lb 7.6 oz (67.8 kg)  06/10/21 138 lb (62.6 kg)    General: Elderly male seated in wheelchair. HEENT: Conjunctiva and lids normal, wearing a mask. Neck: Supple, no elevated JVP or carotid bruits, no thyromegaly. Lungs: Clear to auscultation, nonlabored breathing at rest. Cardiac: Regular rate and rhythm, no S3, 2/6 systolic murmur, no pericardial rub. Extremities: Compression stockings in place with mild lower leg edema.  ECG:  An ECG dated 06/10/2021 was personally reviewed today and demonstrated:  Sinus rhythm.  Recent Labwork: 08/02/2021: TSH 8.632 08/06/2021: ALT 8; AST 15 08/07/2021: Magnesium 2.3 08/10/2021: Hemoglobin 11.3; Platelets 174 08/11/2021: BUN 24; Creatinine, Ser 1.70; Potassium 4.2; Sodium 136   Other Studies Reviewed Today:  Echocardiogram 09/21/2019:  1. Left ventricular ejection fraction, by visual estimation, is 55 to  60%. The left ventricle has normal function. There is moderately increased  left ventricular hypertrophy. Biplane LVEF and global longitudinal strain  measurements do not appear to be  accurate.   2. Left ventricular diastolic parameters are indeterminate.   3. The left ventricle has no regional wall motion abnormalities.   4. Global right ventricle has normal systolic function.The right  ventricular size is normal. No increase in right ventricular wall  thickness.   5. Left atrial size was normal.   6. Right atrial size was upper normal.   7. Mild mitral annular calcification.   8. The mitral valve is grossly normal. Trivial mitral valve  regurgitation.   9. The tricuspid valve is grossly normal. Tricuspid valve regurgitation  is trivial.  10. The aortic valve is tricuspid. Aortic valve  regurgitation is not  visualized. Mild aortic valve sclerosis without stenosis.  11. The pulmonic valve was grossly normal. Pulmonic valve regurgitation is  trivial.  12. Normal pulmonary artery systolic pressure.  13. The inferior vena cava is normal in size with <50% respiratory  variability, suggesting right atrial pressure of 8 mmHg.  14. The tricuspid regurgitant velocity is 2.15 m/s, and with an assumed  right atrial pressure of 8 mmHg, the estimated right ventricular systolic  pressure is normal at 26.5 mmHg.   Assessment and Plan:  1.  Probable HFpEF, last LVEF 55 to 60% as of 2020.  Fluid retention may also be a function of worsening renal insufficiency and prostatic hypertrophy.  Check BMET for follow-up.  For now no changes in current dose of Lasix with potassium supplement.  He is otherwise just on Lopressor.  2.  CKD stage IIIb-IV, last creatinine 1.7 in November 2022.  3.  Paroxysmal to persistent atrial fibrillation with CHA2DS2-VASc score of at least 3.  He has not been anticoagulated based on prior discussions, no active palpitations and heart rate regular today on Lopressor.  Continue low-dose aspirin.  Medication Adjustments/Labs and Tests Ordered: Current medicines are reviewed at length with the patient today.  Concerns regarding medicines are outlined above.   Tests Ordered: Orders Placed This Encounter  Procedures   Basic metabolic panel    Medication Changes: No orders of the defined types were placed in this encounter.   Disposition:  Follow up  6 months.  Signed, Satira Sark, MD, [facility] 11/16/2021 4:24 PM    [facility] [place], [place], [place] [address] Phone: [phone]; Fax: [phone]

## 2021-11-20 ENCOUNTER — Other Ambulatory Visit: Payer: Self-pay | Admitting: *Deleted

## 2021-11-20 DIAGNOSIS — I480 Paroxysmal atrial fibrillation: Secondary | ICD-10-CM

## 2021-11-26 ENCOUNTER — Telehealth: Payer: Self-pay | Admitting: Cardiology

## 2021-11-26 NOTE — Telephone Encounter (Signed)
-----   Message from Satira Sark, MD sent at 11/22/2021  5:01 PM EST ----- Results reviewed.  Follow-up lab work shows creatinine up to 2.7 and potassium normal at 4.2.  No change in Lasix or potassium supplement at this time, would forward results to PCP for review as well.

## 2021-11-26 NOTE — Telephone Encounter (Signed)
Patient's daughter returning call. 

## 2021-11-26 NOTE — Telephone Encounter (Signed)
Patient's daughter [name] informed. Copy sent to PCP

## 2021-12-02 ENCOUNTER — Other Ambulatory Visit: Payer: Self-pay

## 2021-12-02 ENCOUNTER — Emergency Department (HOSPITAL_COMMUNITY): Payer: Medicare Other

## 2021-12-02 ENCOUNTER — Encounter (HOSPITAL_COMMUNITY): Payer: Self-pay

## 2021-12-02 ENCOUNTER — Observation Stay (HOSPITAL_COMMUNITY)
Admission: EM | Admit: 2021-12-02 | Discharge: 2021-12-03 | Disposition: A | Discharge status: Home Health | Payer: Medicare Other | Attending: Family Medicine | Admitting: Family Medicine

## 2021-12-02 DIAGNOSIS — N1832 Chronic kidney disease, stage 3b: Secondary | ICD-10-CM | POA: Diagnosis not present

## 2021-12-02 DIAGNOSIS — Z96641 Presence of right artificial hip joint: Secondary | ICD-10-CM | POA: Diagnosis not present

## 2021-12-02 DIAGNOSIS — E860 Dehydration: Secondary | ICD-10-CM | POA: Diagnosis not present

## 2021-12-02 DIAGNOSIS — I5032 Chronic diastolic (congestive) heart failure: Secondary | ICD-10-CM | POA: Insufficient documentation

## 2021-12-02 DIAGNOSIS — Z87891 Personal history of nicotine dependence: Secondary | ICD-10-CM | POA: Diagnosis not present

## 2021-12-02 DIAGNOSIS — R748 Abnormal levels of other serum enzymes: Secondary | ICD-10-CM | POA: Insufficient documentation

## 2021-12-02 DIAGNOSIS — Z79899 Other long term (current) drug therapy: Secondary | ICD-10-CM | POA: Diagnosis not present

## 2021-12-02 DIAGNOSIS — Z20822 Contact with and (suspected) exposure to covid-19: Secondary | ICD-10-CM | POA: Insufficient documentation

## 2021-12-02 DIAGNOSIS — M109 Gout, unspecified: Secondary | ICD-10-CM | POA: Diagnosis not present

## 2021-12-02 DIAGNOSIS — N179 Acute kidney failure, unspecified: Secondary | ICD-10-CM | POA: Diagnosis not present

## 2021-12-02 DIAGNOSIS — R778 Other specified abnormalities of plasma proteins: Secondary | ICD-10-CM

## 2021-12-02 DIAGNOSIS — E039 Hypothyroidism, unspecified: Secondary | ICD-10-CM | POA: Diagnosis not present

## 2021-12-02 DIAGNOSIS — M25511 Pain in right shoulder: Secondary | ICD-10-CM | POA: Diagnosis present

## 2021-12-02 DIAGNOSIS — R7989 Other specified abnormal findings of blood chemistry: Secondary | ICD-10-CM

## 2021-12-02 DIAGNOSIS — Z85038 Personal history of other malignant neoplasm of large intestine: Secondary | ICD-10-CM | POA: Diagnosis not present

## 2021-12-02 DIAGNOSIS — I130 Hypertensive heart and chronic kidney disease with heart failure and stage 1 through stage 4 chronic kidney disease, or unspecified chronic kidney disease: Secondary | ICD-10-CM | POA: Diagnosis not present

## 2021-12-02 DIAGNOSIS — I480 Paroxysmal atrial fibrillation: Secondary | ICD-10-CM | POA: Diagnosis not present

## 2021-12-02 DIAGNOSIS — I10 Essential (primary) hypertension: Secondary | ICD-10-CM

## 2021-12-02 DIAGNOSIS — Z7982 Long term (current) use of aspirin: Secondary | ICD-10-CM | POA: Diagnosis not present

## 2021-12-02 HISTORY — DX: Malignant (primary) neoplasm, unspecified: C80.1

## 2021-12-02 LAB — CBC WITH DIFFERENTIAL/PLATELET
Abs Immature Granulocytes: 0.02 10*3/uL (ref 0.00–0.07)
Basophils Absolute: 0 10*3/uL (ref 0.0–0.1)
Basophils Relative: 0 %
Eosinophils Absolute: 0 10*3/uL (ref 0.0–0.5)
Eosinophils Relative: 0 %
HCT: 34.1 % — ABNORMAL LOW (ref 39.0–52.0)
Hemoglobin: 10.9 g/dL — ABNORMAL LOW (ref 13.0–17.0)
Immature Granulocytes: 0 %
Lymphocytes Relative: 11 %
Lymphs Abs: 0.6 10*3/uL — ABNORMAL LOW (ref 0.7–4.0)
MCH: 29.1 pg (ref 26.0–34.0)
MCHC: 32 g/dL (ref 30.0–36.0)
MCV: 90.9 fL (ref 80.0–100.0)
Monocytes Absolute: 0.5 10*3/uL (ref 0.1–1.0)
Monocytes Relative: 10 %
Neutro Abs: 4 10*3/uL (ref 1.7–7.7)
Neutrophils Relative %: 79 %
Platelets: 127 10*3/uL — ABNORMAL LOW (ref 150–400)
RBC: 3.75 MIL/uL — ABNORMAL LOW (ref 4.22–5.81)
RDW: 15.9 % — ABNORMAL HIGH (ref 11.5–15.5)
WBC: 5.1 10*3/uL (ref 4.0–10.5)
nRBC: 0 % (ref 0.0–0.2)

## 2021-12-02 LAB — URINALYSIS, ROUTINE W REFLEX MICROSCOPIC
Bilirubin Urine: NEGATIVE
Glucose, UA: NEGATIVE mg/dL
Hgb urine dipstick: NEGATIVE
Ketones, ur: NEGATIVE mg/dL
Leukocytes,Ua: NEGATIVE
Nitrite: NEGATIVE
Protein, ur: NEGATIVE mg/dL
Specific Gravity, Urine: 1.009 (ref 1.005–1.030)
pH: 5 (ref 5.0–8.0)

## 2021-12-02 LAB — COMPREHENSIVE METABOLIC PANEL
ALT: 13 U/L (ref 0–44)
AST: 21 U/L (ref 15–41)
Albumin: 4.2 g/dL (ref 3.5–5.0)
Alkaline Phosphatase: 112 U/L (ref 38–126)
Anion gap: 15 (ref 5–15)
BUN: 83 mg/dL — ABNORMAL HIGH (ref 8–23)
CO2: 21 mmol/L — ABNORMAL LOW (ref 22–32)
Calcium: 9.2 mg/dL (ref 8.9–10.3)
Chloride: 101 mmol/L (ref 98–111)
Creatinine, Ser: 2.22 mg/dL — ABNORMAL HIGH (ref 0.61–1.24)
GFR, Estimated: 26 mL/min — ABNORMAL LOW (ref >60)
Glucose, Bld: 169 mg/dL — ABNORMAL HIGH (ref 70–99)
Potassium: 4 mmol/L (ref 3.5–5.1)
Sodium: 137 mmol/L (ref 135–145)
Total Bilirubin: 1 mg/dL (ref 0.3–1.2)
Total Protein: 7.3 g/dL (ref 6.5–8.1)

## 2021-12-02 LAB — TROPONIN I (HIGH SENSITIVITY)
Troponin I (High Sensitivity): 113 ng/L (ref <18)
Troponin I (High Sensitivity): 99 ng/L — ABNORMAL HIGH (ref <18)

## 2021-12-02 LAB — RESP PANEL BY RT-PCR (FLU A&B, COVID) ARPGX2
Influenza A by PCR: NEGATIVE
Influenza B by PCR: NEGATIVE
SARS Coronavirus 2 by RT PCR: NEGATIVE

## 2021-12-02 LAB — URIC ACID: Uric Acid, Serum: 12.2 mg/dL — ABNORMAL HIGH (ref 3.7–8.6)

## 2021-12-02 LAB — LIPASE, BLOOD: Lipase: 37 U/L (ref 11–51)

## 2021-12-02 MED ORDER — FINASTERIDE 5 MG PO TABS
5.0000 mg | ORAL_TABLET | Freq: Every day | ORAL | Status: DC
  Administered 2021-12-02 – 2021-12-03 (×2): 5 mg via ORAL
  Filled 2021-12-02 (×2): qty 1

## 2021-12-02 MED ORDER — SENNOSIDES-DOCUSATE SODIUM 8.6-50 MG PO TABS: 1.0000 | ORAL_TABLET | Freq: Two times a day (BID) | ORAL | Status: DC | PRN

## 2021-12-02 MED ORDER — ASPIRIN 81 MG PO CHEW
324.0000 mg | CHEWABLE_TABLET | Freq: Once | ORAL | Status: AC
  Administered 2021-12-02: 324 mg via ORAL
  Filled 2021-12-02: qty 4

## 2021-12-02 MED ORDER — ONDANSETRON HCL 4 MG PO TABS: 4.0000 mg | ORAL_TABLET | Freq: Four times a day (QID) | ORAL | Status: DC | PRN

## 2021-12-02 MED ORDER — FENTANYL CITRATE PF 50 MCG/ML IJ SOSY
12.5000 ug | PREFILLED_SYRINGE | INTRAMUSCULAR | Status: DC | PRN
  Administered 2021-12-02: 50 ug via INTRAVENOUS
  Filled 2021-12-02: qty 1

## 2021-12-02 MED ORDER — TAMSULOSIN HCL 0.4 MG PO CAPS
0.8000 mg | ORAL_CAPSULE | Freq: Every day | ORAL | Status: DC
  Administered 2021-12-02 – 2021-12-03 (×2): 0.8 mg via ORAL
  Filled 2021-12-02 (×2): qty 2

## 2021-12-02 MED ORDER — HEPARIN SODIUM (PORCINE) 5000 UNIT/ML IJ SOLN
5000.0000 [IU] | Freq: Three times a day (TID) | INTRAMUSCULAR | Status: DC
  Administered 2021-12-02 – 2021-12-03 (×2): 5000 [IU] via SUBCUTANEOUS
  Filled 2021-12-02 (×2): qty 1

## 2021-12-02 MED ORDER — PREDNISONE 20 MG PO TABS
40.0000 mg | ORAL_TABLET | Freq: Every day | ORAL | Status: DC
  Administered 2021-12-02 – 2021-12-03 (×2): 40 mg via ORAL
  Filled 2021-12-02 (×2): qty 2

## 2021-12-02 MED ORDER — MELATONIN 3 MG PO TABS
9.0000 mg | ORAL_TABLET | Freq: Every day | ORAL | Status: DC
  Administered 2021-12-02: 9 mg via ORAL
  Filled 2021-12-02: qty 3

## 2021-12-02 MED ORDER — FENTANYL CITRATE PF 50 MCG/ML IJ SOSY
50.0000 ug | PREFILLED_SYRINGE | Freq: Once | INTRAMUSCULAR | Status: AC
  Administered 2021-12-02: 50 ug via INTRAVENOUS
  Filled 2021-12-02: qty 1

## 2021-12-02 MED ORDER — OXYCODONE HCL 5 MG PO TABS
5.0000 mg | ORAL_TABLET | ORAL | Status: DC | PRN
  Administered 2021-12-02 – 2021-12-03 (×2): 5 mg via ORAL
  Filled 2021-12-02 (×2): qty 1

## 2021-12-02 MED ORDER — NITROGLYCERIN 0.4 MG SL SUBL: 0.4000 mg | SUBLINGUAL_TABLET | SUBLINGUAL | Status: DC | PRN

## 2021-12-02 MED ORDER — ACETAMINOPHEN 650 MG RE SUPP: 650.0000 mg | Freq: Four times a day (QID) | RECTAL | Status: DC | PRN

## 2021-12-02 MED ORDER — ACETAMINOPHEN 325 MG PO TABS
650.0000 mg | ORAL_TABLET | Freq: Four times a day (QID) | ORAL | Status: DC | PRN
  Administered 2021-12-02 – 2021-12-03 (×2): 650 mg via ORAL
  Filled 2021-12-02 (×3): qty 2

## 2021-12-02 MED ORDER — LEVOTHYROXINE SODIUM 50 MCG PO TABS
50.0000 ug | ORAL_TABLET | Freq: Every day | ORAL | Status: DC
  Administered 2021-12-03: 50 ug via ORAL
  Filled 2021-12-02: qty 1

## 2021-12-02 MED ORDER — ASPIRIN EC 81 MG PO TBEC
81.0000 mg | DELAYED_RELEASE_TABLET | ORAL | Status: DC
Start: 2021-12-03 — End: 2021-12-03
  Administered 2021-12-03: 81 mg via ORAL
  Filled 2021-12-02: qty 1

## 2021-12-02 MED ORDER — METOPROLOL TARTRATE 25 MG PO TABS
12.5000 mg | ORAL_TABLET | Freq: Two times a day (BID) | ORAL | Status: DC
  Filled 2021-12-02 (×2): qty 1

## 2021-12-02 MED ORDER — LACTATED RINGERS IV SOLN: INTRAVENOUS | Status: DC

## 2021-12-02 MED ORDER — ONDANSETRON HCL 4 MG/2ML IJ SOLN: 4.0000 mg | Freq: Four times a day (QID) | INTRAMUSCULAR | Status: DC | PRN

## 2021-12-02 MED ORDER — SODIUM CHLORIDE 0.9 % IV SOLN: INTRAVENOUS | Status: DC

## 2021-12-02 NOTE — Assessment & Plan Note (Signed)
Continue on levothyroxine 

## 2021-12-02 NOTE — Assessment & Plan Note (Signed)
Blood pressure currently stable on metoprolol ?

## 2021-12-02 NOTE — ED Notes (Signed)
Patient requesting something else for pain.  Dr Roderic Palau notified and will place order ?

## 2021-12-02 NOTE — Assessment & Plan Note (Signed)
Heart rate is currently stable on metoprolol ?Followed by cardiology ?He is not on chronic anticoagulation per patient decision ?

## 2021-12-02 NOTE — ED Provider Notes (Signed)
[facility] EMERGENCY DEPARTMENT Provider Note   CSN: [id number] Arrival date & time: 12/02/21  1156     History  Chief Complaint  Patient presents with   Shoulder Pain    Jayan Raymundo is a 88 y.o. male.   Shoulder Pain  This patient is a 88 year old male, he has a history of hypertension, he was recently admitted to the hospital and had surgery because of a bowel obstruction that was secondary to a tumor which was resected.  He is not currently on chemotherapy or radiation.  The patient states that in the last 24 hours he has developed some increasing pain in his right shoulder, it is now severe and worse with any range of motion.  He denies any recent change in his routine has not had any fall or trauma, has not had any redness or fever, feels like the pain radiates down his arm into his hand but states it is primarily at his shoulder.  It is constant, severe and not improving with the hydrocodone which she was given at home, this was an old medication not currently prescribed to him.  He did have some nausea and dry heaving which has seemed to resolve.  He denies chest pain shortness of breath or swelling of his legs.  He presents with his daughter who is an additional historian  Home Medications Prior to Admission medications   Medication Sig Start Date End Date Taking? Authorizing Provider  acetaminophen (TYLENOL) 500 MG tablet Take 1 tablet (500 mg total) by mouth 2 (two) times daily. 08/12/21   Winferd Humphrey, PA-C  aspirin EC 81 MG tablet Take 81 mg by mouth every other day.     [provider]  finasteride (PROSCAR) 5 MG tablet Take 1 tablet by mouth daily. 08/20/21   [provider]  furosemide (LASIX) 20 MG tablet TAKE 1 TABLET BY MOUTH EVERY OTHER DAY ALTERNATING  WITH  40  MG  EVERY  OTHER  DAY Patient taking differently: Take 40 mg by mouth 3 (three) times daily. BUT occasionally does less depending on his weight, sob, & swelling 10/22/21   Satira Sark, MD  levothyroxine (SYNTHROID) 50 MCG tablet Take 50 mcg by mouth daily before breakfast.    [provider]  metoprolol tartrate (LOPRESSOR) 25 MG tablet TAKE ONE-HALF TABLET BY MOUTH TWICE DAILY. GENERIC EQUIVALENT FOR LOPRESSOR 06/10/21   Satira Sark, MD  potassium chloride (KLOR-CON) 10 MEQ tablet Take 10 mEq by mouth daily. 10/27/21   [provider]  senna-docusate (SENOKOT-S) 8.6-50 MG tablet Take 1 tablet by mouth 2 (two) times daily between meals as needed for mild constipation. 08/12/21   Mercy Riding, MD  tamsulosin (FLOMAX) 0.4 MG CAPS capsule Take 0.8 mg by mouth daily.    [provider]  triamcinolone cream (KENALOG) 0.1 % Apply 1 application topically 2 (two) times daily as needed (itching). 04/22/21   [provider]  UNABLE TO FIND Take 1 tablet by mouth every other day. Med Name: [place]    [provider]      Allergies    Patient has no known allergies.    Review of Systems   Review of Systems  All other systems reviewed and are negative.  Physical Exam Updated Vital Signs BP (!) 147/85 (BP Location: Left Arm)    Pulse 64    Temp 98 F (36.7 C) (Oral)    Resp 20    Ht 1.803 m (  5\' 11" )    Wt 68 kg    SpO2 100%    BMI 20.92 kg/m  Physical Exam Vitals and nursing note reviewed.  Constitutional:      General: He is not in acute distress.    Appearance: He is well-developed.  HENT:     Head: Normocephalic and atraumatic.     Mouth/Throat:     Pharynx: No oropharyngeal exudate.  Eyes:     General: No scleral icterus.       Right eye: No discharge.        Left eye: No discharge.     Conjunctiva/sclera: Conjunctivae normal.     Pupils: Pupils are equal, round, and reactive to light.  Neck:     Thyroid: No thyromegaly.     Vascular: No JVD.  Cardiovascular:     Rate and Rhythm: Normal rate and regular rhythm.     Heart sounds: Normal heart sounds. No murmur heard.   No friction rub. No gallop.   Pulmonary:     Effort: Pulmonary effort is normal. No respiratory distress.     Breath sounds: Normal breath sounds. No wheezing or rales.  Abdominal:     General: Bowel sounds are normal. There is no distension.     Palpations: Abdomen is soft. There is no mass.     Tenderness: There is no abdominal tenderness.  Musculoskeletal:        General: Tenderness present. Normal range of motion.     Cervical back: Normal range of motion and neck supple.     Comments: There is no obvious visible swelling of the right shoulder compared to the left shoulder.  It does not appear to have any deformity, passive range of motion has some pain, active range of motion has more pain, there is also tenderness with palpation around the shoulder girdle.  There is no redness or overlying warmth.  There is normal pulses to the right upper extremity and there is no pain with range of motion of the elbow to either flexion extension pronation or supination.  Lymphadenopathy:     Cervical: No cervical adenopathy.  Skin:    General: Skin is warm and dry.     Findings: No erythema or rash.  Neurological:     Mental Status: He is alert.     Coordination: Coordination normal.     Comments: Normal strength and sensation of the bilateral upper extremities diffusely  Psychiatric:        Behavior: Behavior normal.    ED Results / Procedures / Treatments   Labs (all labs ordered are listed, but only abnormal results are displayed) Labs Reviewed  CBC WITH DIFFERENTIAL/PLATELET - Abnormal; Notable for the following components:      Result Value   RBC 3.75 (*)    Hemoglobin 10.9 (*)    HCT 34.1 (*)    RDW 15.9 (*)    Platelets 127 (*)    Lymphs Abs 0.6 (*)    All other components within normal limits  URINALYSIS, ROUTINE W REFLEX MICROSCOPIC  COMPREHENSIVE METABOLIC PANEL  LIPASE, BLOOD  TROPONIN I (HIGH SENSITIVITY)    EKG EKG Interpretation  Date/Time:  Wednesday December 02 2021 12:04:18 EST Ventricular  Rate:  76 PR Interval:    QRS Duration: 144 QT Interval:  444 QTC Calculation: 500 R Axis:   -74 Text Interpretation: Atrial flutter RBBB and LAFB similar to multiple prior EKG's, flutter Confirmed by Noemi Chapel [phone]) on 12/02/2021 12:10:12 PM  Radiology  No results found.  Procedures Procedures    Medications Ordered in ED Medications  fentaNYL (SUBLIMAZE) injection 50 mcg (has no administration in time range)    ED Course/ Medical Decision Making/ A&P Clinical Course as of 12/02/21 1413  Wed Dec 02, 2021  1408 The patient's labs suggest that there is a certain degree of dehydration, his BUN has gone from less than 40 to over 80, creatinine is gone up to 2.22 from 1.7, there is no leukocytosis, no significant anemia, lipase is normal [BM]    Clinical Course User Index [BM] Noemi Chapel, MD                           Medical Decision Making Amount and/or Complexity of Data Reviewed Labs: ordered. Radiology: ordered.  Risk OTC drugs. Prescription drug management. Decision regarding hospitalization.    This patient presents to the ED for concern of shoulder pain, this involves an extensive number of treatment options, and is a complaint that carries with it a high risk of complications and morbidity.  The differential diagnosis includes  EKG does not reveal anything new, he has a history of atrial flutter and right bundle branch block which is what we see today.  Differential diagnosis for this patient's complaint would include trauma, dislocation, subluxation, labrum tear, rotator cuff tear, fracture, septic joint, arthritis   Co morbidities that complicate the patient evaluation  Hypertension Hypothyroidism Elderly Recent cancer   Additional history obtained:  Additional history obtained from daughter as well as the electronic medical record External records from outside source obtained and reviewed including echocardiogram from 2020 showing ejection fraction  of 50 to 55%, no signs of diastolic dysfunction   Lab Tests:  I Ordered, and personally interpreted labs.  The pertinent results include: Troponin which is elevated, measures 113, prior measurement 6 months ago was 64, CBC unremarkable, metabolic panel with worsening creatinine and very elevated BUN consistent with acute kidney injury   Imaging Studies ordered:  I ordered imaging studies including portable chest x-ray with some ill-defined interstitial opacities, no obvious focal lobar infiltrate, no pneumothorax.  The x-ray of the shoulder was also performed and does not show anything other than some degenerative change I independently visualized and interpreted imaging which showed degenerative change of the shoulder I agree with the radiologist interpretation   Cardiac Monitoring:  The patient was maintained on a cardiac monitor.  I personally viewed and interpreted the cardiac monitored which showed an underlying rhythm of: Atrial flutter with variable block   Medicines ordered and prescription drug management:  I ordered medication including fentanyl for pain, aspirin and nitroglycerin Reevaluation of the patient after these medicines showed that the patient improved I have reviewed the patients home medicines and have made adjustments as needed   Test Considered:  CT scan of the shoulder but at this time with renal failure this would not be indicated.   Critical Interventions:  IV fluid hydration, pain control, evaluation for cardiac causes with elevated troponin   Consultations Obtained:  I requested consultation with the cardiologist,  and discussed lab and imaging findings as well as pertinent plan - they recommend: Admission locally with hydration, trending troponins, will discuss with hospitalist for admission - Dr. Roderic Palau to come see the patient for admission   Problem List / ED Course:  Shoulder pain and chest pain, seems less likely to be related to a primary  cardiac cause, after discussion with the cardiologist they  feel more comfortable letting the patient be treated here without aggressive interventions given his advanced age and the likelihood that this is a troponin leak given his underlying atrial flutter and pain and dehydration.    Social Determinants of Health:  None           Final Clinical Impression(s) / ED Diagnoses Final diagnoses:  Acute kidney injury ([place])  Elevated troponin  Dehydration     Noemi Chapel, MD 12/02/21 1428

## 2021-12-02 NOTE — Consult Note (Signed)
Cardiology Consult Note:   Patient ID: Nathan Kim MRN: [id number]; DOB: 1920/07/20   Admission date: 12/02/2021  PCP:  [name], MD   [facility] Providers Cardiologist:  Rozann Lesches, MD       Chief Complaint:  Troponin elevation  Patient Profile:   Nathan Kim is a 88 y.o. male with Colon cancer, AF and AFL, HTN CKD Stage IV who is being seen 12/02/2021 for the evaluation of troponin elevation.  History of Present Illness:   Nathan Kim has last seen in the setting of his colon cancer and stage IV CKD.  Per ED documentation came in "from home on [facility]osp[facility] for cancer with complaints of right shoulder pain that started the previous night and developed into generalized body pain allover."  In clarification from family this was PC not hospice.  AS part of ED evaluation troponin 113->99.  EKG showed rate control atrial flutter.  Cardiology asked to see in the setting of elevated troponin.  Patient notes that he is feeling right should pain that was un-abating.  Is has progressed to his whole body having pain.  He denies  no chest pain, chest pressure, chest tightness, chest stinging.  Discomfort occurs with any movement of his right shoulder.  He has received fentanyl that has improve but not resolved the pain.  No shortness of breath, DOE .  No PND or orthopnea.  No weight gain, leg swelling , or abdominal swelling.  No syncope or near syncope . Notes  no palpitations or funny heart beats (he is asymptomatic of atrial fibrillation).       Past Medical History:  Diagnosis Date   Arthritis    BPH (benign prostatic hyperplasia)    Cancer (HCC)    colon adenocarcinoma   CKD (chronic kidney disease) stage 4, GFR 15-29 ml/min (HCC)    CKD (chronic kidney disease), stage IV ([place]) 06/05/2021   Essential hypertension    Family history of adverse reaction to anesthesia    Hypothyroidism 06/06/2021   Paroxysmal atrial fibrillation ([place])    Phlebitis 10/2016    Past  Surgical History:  Procedure Laterality Date   BACK SURGERY     carpel tunnel     EYE SURGERY     HERNIA REPAIR     LAPAROSCOPY N/A 08/05/2021   Procedure: LAPAROSCOPIC ASSISTED RIGHT COLON RESECTION;  Surgeon: Erroll Luna, MD;  Location: [place];  Service: General;  Laterality: N/A;   LAPAROTOMY N/A 08/05/2021   Procedure: EXPLORATORY LAPAROTOMY;  Surgeon: Erroll Luna, MD;  Location: [place];  Service: General;  Laterality: N/A;   LUMBAR LAMINECTOMY/DECOMPRESSION MICRODISCECTOMY N/A 02/27/2016   Procedure: LUMBAR LAMINECTOMY/DECOMPRESSION MICRODISCECTOMY 3 LEVELS;  Surgeon: Kristeen Miss, MD;  Location: [facility];  Service: Neurosurgery;  Laterality: N/A;  L2-3 L3-4 L4-5 Laminectomy   TOTAL HIP ARTHROPLASTY Right 05/24/2017   Procedure: RIGHT TOTAL HIP ARTHROPLASTY ANTERIOR APPROACH;  Surgeon: Paralee Cancel, MD;  Location: [facility];  Service: Orthopedics;  Laterality: Right;  70 mins     Medications Prior to Admission: Prior to Admission medications   Medication Sig Start Date End Date Taking? Authorizing Provider  aspirin EC 81 MG tablet Take 81 mg by mouth every other day.    Yes [provider]  doxycycline (VIBRA-TABS) 100 MG tablet Take 100 mg by mouth 2 (two) times daily. 11/30/21  Yes [provider]  finasteride (PROSCAR) 5 MG tablet Take 1 tablet by mouth daily. 08/20/21  Yes [provider]  furosemide (LASIX) 20 MG  tablet TAKE 1 TABLET BY MOUTH EVERY OTHER DAY ALTERNATING  WITH  40  MG  EVERY  OTHER  DAY Patient taking differently: Take 40 mg by mouth 3 (three) times daily. BUT occasionally does less depending on his weight, sob, & swelling 10/22/21  Yes Satira Sark, MD  levothyroxine (SYNTHROID) 50 MCG tablet Take 50 mcg by mouth daily before breakfast.   Yes [provider]  Melatonin 10 MG TABS Take 10 mg by mouth at bedtime. 10/08/21  Yes [provider]  metoprolol tartrate (LOPRESSOR) 25 MG tablet TAKE ONE-HALF TABLET BY  MOUTH TWICE DAILY. GENERIC EQUIVALENT FOR LOPRESSOR Patient taking differently: Take 12.5 mg by mouth 2 (two) times daily. 06/10/21  Yes Satira Sark, MD  potassium chloride (KLOR-CON) 10 MEQ tablet Take 10 mEq by mouth daily. 10/27/21  Yes [provider]  senna-docusate (SENOKOT-S) 8.6-50 MG tablet Take 1 tablet by mouth 2 (two) times daily between meals as needed for mild constipation. 08/12/21  Yes Mercy Riding, MD  tamsulosin (FLOMAX) 0.4 MG CAPS capsule Take 0.8 mg by mouth daily.   Yes [provider]  triamcinolone cream (KENALOG) 0.1 % Apply 1 application topically 2 (two) times daily as needed (itching). 04/22/21  Yes [provider]  UNABLE TO FIND Take 1 tablet by mouth every other day. Med Name: Tawnya Crook WITH IRON   Yes [provider]     Allergies:   No Known Allergies  Social History:   Social History   Socioeconomic History   Marital status: Married    Spouse name: Not on file   Number of children: Not on file   Years of education: Not on file   Highest education level: Not on file  Occupational History   Not on file  Tobacco Use   Smoking status: Former    Packs/day: 0.50    Years: 50.00    Pack years: 25.00    Types: Cigarettes    Start date: 07/03/1929    Quit date: 07/04/1979    Years since quitting: 42.4   Smokeless tobacco: Never  Vaping Use   Vaping Use: Never used  Substance and Sexual Activity   Alcohol use: No    Alcohol/week: 0.0 standard drinks   Drug use: No   Sexual activity: Not on file  Other Topics Concern   Not on file  Social History Narrative   Not on file   Social Determinants of Health   Financial Resource Strain: Not on file  Food Insecurity: Not on file  Transportation Needs: Not on file  Physical Activity: Not on file  Stress: Not on file  Social Connections: Not on file  Intimate Partner Violence: Not on file    Family History:   History of coronary artery disease notable for no  members.  ROS:  Please see the history of present illness.  All other ROS reviewed and negative.     Physical Exam/Data:   Vitals:   12/02/21 1500 12/02/21 1515 12/02/21 1530 12/02/21 1539  BP: 122/63  (!) 130/97 (!) 130/97  Pulse: (!) 54 (!) 57  (!) 57  Resp: 12 12  (!) 21  Temp:      TempSrc:      SpO2: 90% 90%  90%  Weight:      Height:       No intake or output data in the 24 hours ending 12/02/21 1624 Last 3 Weights 12/02/2021 11/16/2021 08/12/2021  Weight (lbs) 150 lb 151 lb  6.4 oz 149 lb 7.6 oz  Weight (kg) 68.04 kg 68.675 kg 67.8 kg     Body mass index is 20.92 kg/m.   Gen: mild distress, Elderly frail male   Neck: No JVD,  Ears: No Pilar Plate Sign Cardiac: No Rubs or Gallops, no Murmur, Regular rhythm on exam (no bradycardia during exam) Respiratory: Decreased breath sounds at bases, normal effort, normal  respiratory rate GI: Soft, nontender, non-distended  MS: No  edema;  no active ROM R shoulder, unable to do passive ROM secondary to pain Integument: Skin feels warm Neuro:  At time of evaluation, alert and oriented to person/place/time/situation  Psych: Normal affect, patient feels better  EKG:  The ECG that was done  was personally reviewed and demonstrates heart rate 75 AFL RBBB and LAFB (BIFB)  Laboratory Data:  High Sensitivity Troponin:   Recent Labs  Lab 12/02/21 1216 12/02/21 1357  TROPONINIHS 113* 99*      Chemistry Recent Labs  Lab 12/02/21 1216  NA 137  K 4.0  CL 101  CO2 21*  GLUCOSE 169*  BUN 83*  CREATININE 2.22*  CALCIUM 9.2  GFRNONAA 26*  ANIONGAP 15    Recent Labs  Lab 12/02/21 1216  PROT 7.3  ALBUMIN 4.2  AST 21  ALT 13  ALKPHOS 112  BILITOT 1.0   Lipids No results for input(s): CHOL, TRIG, HDL, LABVLDL, LDLCALC, CHOLHDL in the last 168 hours. Hematology Recent Labs  Lab 12/02/21 1216  WBC 5.1  RBC 3.75*  HGB 10.9*  HCT 34.1*  MCV 90.9  MCH 29.1  MCHC 32.0  RDW 15.9*  PLT 127*   Thyroid No results for  input(s): TSH, FREET4 in the last 168 hours. BNPNo results for input(s): BNP, PROBNP in the last 168 hours.  DDimer No results for input(s): DDIMER in the last 168 hours.   Radiology/Studies:  DG Shoulder Right  Result Date: 12/02/2021 CLINICAL DATA:  Right shoulder pain. EXAM: RIGHT SHOULDER - 2+ VIEW COMPARISON:  None. FINDINGS: No acute fracture or dislocation. Mild acromioclavicular joint space narrowing with small marginal osteophytes. The glenohumeral joint space is relatively preserved. Faint amorphous mineralization/calcification overlying the greater tuberosity and within the axillary recess. Osteopenia. IMPRESSION: 1. Mild acromioclavicular osteoarthritis. 2. 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 Electronically Signed   By: Titus Dubin M.D.   On: 12/02/2021 13:09   DG Chest Port 1 View  Result Date: 12/02/2021 CLINICAL DATA:  Diffuse body aches. EXAM: PORTABLE CHEST 1 VIEW COMPARISON:  Chest x-ray dated January 10, 2020. FINDINGS: Unchanged borderline cardiomegaly. New diffuse interstitial opacity. No consolidation, pneumothorax, or large pleural effusion. No acute osseous abnormality. IMPRESSION: 1. New diffuse interstitial opacity could reflect pulmonary edema or atypical infection. Electronically Signed   By: Titus Dubin M.D.   On: 12/02/2021 13:00     Assessment and Plan:   Elevate troponin AFL unknown duration (presumed long standing persistent. Anemia In the setting of stage III cancer, Frailty and CKD stage IV Transferring from home with hospice to ED for evaluation - does not sound like plaque rupture event and seem more consistent with hypovolemic demand - if this was a plaque rupture event, due to other comorbidities, we would as most offer 48 hours of heparin - will not change to [facility] from aspiring after discussions with family (daughter at bedside)  [facility] will  sign off.   Medication Recommendations:  Asa 81 mg PO daily, low dose metoprolol; agree with  lasix hold in the setting of hydration Other recommendations (labs, testing, etc):  NA Follow up as an outpatient:  based on [place] we can arrange outpatient follow up     For questions or updates, please contact [place] Please consult [email or web address] for contact info under     Signed, Werner Lean, MD  12/02/2021 4:24 PM

## 2021-12-02 NOTE — Assessment & Plan Note (Addendum)
Secondary to acute gout ?Pain much improved after prednisone started ?

## 2021-12-02 NOTE — Assessment & Plan Note (Signed)
Mild elevation of troponin, likely related to renal failure ?Seen by cardiology, no plans for invasive work-up ?Continue supportive care ?

## 2021-12-02 NOTE — Assessment & Plan Note (Addendum)
Currently appears to be compensated ?Held diuretics in light of elevated creatinine and dehydration ?He was taking high dose furosemide 40 mg TID which I have advised against given severe acute gout attack.  ?Reduced furosemide to 20 mg daily.  Monitor weights and share with PCP and cardiologist regarding need to change doses.   ?

## 2021-12-02 NOTE — H&P (Signed)
History and Physical    Patient: Nathan Kim IOX:[id number] DOB: 05-07-1920 DOA: 12/02/2021 DOS: the patient was seen and examined on 12/02/2021 PCP: [name], MD  Patient coming from: Home  Chief Complaint:  Chief Complaint  Patient presents with   Shoulder Pain    HPI: Nathan Kim is a 88 y.o. male with medical history significant of CKD 3B, colon cancer status postresection 26/83, BPH, diastolic congestive heart failure, paroxysmal atrial fibrillation, hypothyroidism.  Patient lives at home with his daughter.  He presents to the emergency room today with complaints of right shoulder pain.  Pain has been present intermittently for the past few days.  Since yesterday, it has been persistent, severe and has been unable to raise his arm.  Denies any fall, trauma, wounds.  He has not had a fever.  His oral intake has been normal for him.  He has not had any melena or hematochezia.  No diarrhea.  He takes chronic diuretics for lower extremity edema.  Work-up in the emergency room including x-ray of his right shoulder did not show any significant findings.  Chest x-ray report indicates new diffuse interstitial opacity which could reflect pulmonary edema versus atypical infection.  Patient has not had any cough, shortness of breath or fever.  Basic labs were checked and showed elevated BUN in the 80s.  Creatinine also increased to 2.2 from a baseline of 1.7.  Mild elevation of troponin at 113.  Review of Systems: As mentioned in the history of present illness. All other systems reviewed and are negative. Past Medical History:  Diagnosis Date   Arthritis    BPH (benign prostatic hyperplasia)    Cancer (HCC)    colon adenocarcinoma   CKD (chronic kidney disease) stage 4, GFR 15-29 ml/min (HCC)    CKD (chronic kidney disease), stage IV ([place]) 06/05/2021   Essential hypertension    Family history of adverse reaction to anesthesia    Hypothyroidism 06/06/2021   Paroxysmal atrial  fibrillation ([place])    Phlebitis 10/2016   Past Surgical History:  Procedure Laterality Date   BACK SURGERY     carpel tunnel     EYE SURGERY     HERNIA REPAIR     LAPAROSCOPY N/A 08/05/2021   Procedure: LAPAROSCOPIC ASSISTED RIGHT COLON RESECTION;  Surgeon: Erroll Luna, MD;  Location: [facility];  Service: General;  Laterality: N/A;   LAPAROTOMY N/A 08/05/2021   Procedure: EXPLORATORY LAPAROTOMY;  Surgeon: Erroll Luna, MD;  Location: [facility];  Service: General;  Laterality: N/A;   LUMBAR LAMINECTOMY/DECOMPRESSION MICRODISCECTOMY N/A 02/27/2016   Procedure: LUMBAR LAMINECTOMY/DECOMPRESSION MICRODISCECTOMY 3 LEVELS;  Surgeon: Kristeen Miss, MD;  Location: [facility];  Service: Neurosurgery;  Laterality: N/A;  L2-3 L3-4 L4-5 Laminectomy   TOTAL HIP ARTHROPLASTY Right 05/24/2017   Procedure: RIGHT TOTAL HIP ARTHROPLASTY ANTERIOR APPROACH;  Surgeon: Paralee Cancel, MD;  Location: [facility];  Service: Orthopedics;  Laterality: Right;  70 mins   Social History:  reports that he quit smoking about 42 years ago. His smoking use included cigarettes. He started smoking about 92 years ago. He has a 25.00 pack-year smoking history. He has never used smokeless tobacco. He reports that he does not drink alcohol and does not use drugs.  No Known Allergies  History reviewed. No pertinent family history.  Prior to Admission medications   Medication Sig Start Date End Date Taking? Authorizing Provider  aspirin EC 81 MG tablet Take 81 mg by mouth every other day.    Yes [provider]  doxycycline (VIBRA-TABS) 100 MG tablet Take 100 mg by mouth 2 (two) times daily. 11/30/21  Yes [provider]  finasteride (PROSCAR) 5 MG tablet Take 1 tablet by mouth daily. 08/20/21  Yes [provider]  furosemide (LASIX) 20 MG tablet TAKE 1 TABLET BY MOUTH EVERY OTHER DAY ALTERNATING  WITH  40  MG  EVERY  OTHER  DAY Patient taking differently: Take 40 mg by mouth 3 (three) times daily. BUT  occasionally does less depending on his weight, sob, & swelling 10/22/21  Yes Satira Sark, MD  levothyroxine (SYNTHROID) 50 MCG tablet Take 50 mcg by mouth daily before breakfast.   Yes [provider]  Melatonin 10 MG TABS Take 10 mg by mouth at bedtime. 10/08/21  Yes [provider]  metoprolol tartrate (LOPRESSOR) 25 MG tablet TAKE ONE-HALF TABLET BY MOUTH TWICE DAILY. GENERIC EQUIVALENT FOR LOPRESSOR Patient taking differently: Take 12.5 mg by mouth 2 (two) times daily. 06/10/21  Yes Satira Sark, MD  potassium chloride (KLOR-CON) 10 MEQ tablet Take 10 mEq by mouth daily. 10/27/21  Yes [provider]  senna-docusate (SENOKOT-S) 8.6-50 MG tablet Take 1 tablet by mouth 2 (two) times daily between meals as needed for mild constipation. 08/12/21  Yes Mercy Riding, MD  tamsulosin (FLOMAX) 0.4 MG CAPS capsule Take 0.8 mg by mouth daily.   Yes [provider]  triamcinolone cream (KENALOG) 0.1 % Apply 1 application topically 2 (two) times daily as needed (itching). 04/22/21  Yes [provider]  UNABLE TO FIND Take 1 tablet by mouth every other day. Med Name: Tawnya Crook WITH IRON   Yes [provider]    Physical Exam: Vitals:   12/02/21 1539 12/02/21 1630 12/02/21 1700 12/02/21 1733  BP: (!) 130/97 129/64 127/66 130/66  Pulse: (!) 57 65 (!) 57 62  Resp: (!) 21 (!) 23 17 16   Temp:    98.4 F (36.9 C)  TempSrc:      SpO2: 90% 95% 95% 99%  Weight:      Height:       General exam: Alert, awake, oriented x 3 Respiratory system: Clear to auscultation. Respiratory effort normal. Cardiovascular system:RRR. No murmurs, rubs, gallops. Gastrointestinal system: Abdomen is nondistended, soft and nontender. No organomegaly or masses felt. Normal bowel sounds heard. Central nervous system: Alert and oriented. No focal neurological deficits. Extremities: Mild warmth over right shoulder.  No erythema appreciated.  He has pain at rest which is  increased with active/passive range of motion. Skin: No rashes, lesions or ulcers Psychiatry: Judgement and insight appear normal. Mood & affect appropriate.    Data Reviewed:  Reviewed chest x-ray, shoulder x-ray, basic labs including CBC and chemistry.  Assessment and Plan: * AKI (acute kidney injury) ([place])- (present on admission) Creatinine increased to 2.2 with a BUN of 83 No reported history of melena or GI bleeding No recent steroids Started on IV hydration, follow urine output and renal function  Right shoulder pain- (present on admission) He does not have any clear signs of infection. He does not have any leukocytosis or fever No erythema appreciated over the joint Discussed with orthopedics, Dr. Amedeo Kinsman We will give a trial of prednisone and if improved, can follow-up with orthopedics in the outpatient setting If patient does not improve, will request inpatient consultation  Stage 3b chronic kidney disease (CKD) ([place])- (present on admission) Baseline creatinine of 1.7  Chronic diastolic CHF (congestive heart failure) ([place])- (present on admission) Currently appears  to be compensated Holding diuretics in light of elevated creatinine Continue to monitor  Hypothyroidism- (present on admission) Continue on levothyroxine  Elevated troponin- (present on admission) Mild elevation of troponin, likely related to renal failure Seen by cardiology, no plans for invasive work-up Continue supportive care  Essential hypertension- (present on admission) Blood pressure currently stable on metoprolol  PAF (paroxysmal atrial fibrillation) ([place])- (present on admission) Heart rate is currently stable on metoprolol Followed by cardiology He is not on chronic anticoagulation per patient decision       Advance Care Planning:   Code Status: DNR patient is followed by palliative care as an outpatient  Consults:   Family Communication: Updated patient's daughter, [name] at the  bedside  Severity of Illness: The appropriate patient status for this patient is OBSERVATION. Observation status is judged to be reasonable and necessary in order to provide the required intensity of service to ensure the patient's safety. The patient's presenting symptoms, physical exam findings, and initial radiographic and laboratory data in the context of their medical condition is felt to place them at decreased risk for further clinical deterioration. Furthermore, it is anticipated that the patient will be medically stable for discharge from the hospital within 2 midnights of admission.   Author: Kathie Dike, MD 12/02/2021 7:09 PM  For on call review [email or web address].

## 2021-12-02 NOTE — ED Triage Notes (Addendum)
Patient via EMS from [facility] with complaints of right shoulder pain that started the previous night and developed into generalized body pain allover.  ?

## 2021-12-02 NOTE — Assessment & Plan Note (Signed)
Baseline creatinine of 1.7 ?

## 2021-12-02 NOTE — Assessment & Plan Note (Signed)
Creatinine increased to 2.2 with a BUN of 83 ?No reported history of melena or GI bleeding ?No recent steroids ?Started on IV hydration, follow urine output and renal function ?

## 2021-12-03 DIAGNOSIS — M10211 Drug-induced gout, right shoulder: Secondary | ICD-10-CM | POA: Diagnosis not present

## 2021-12-03 DIAGNOSIS — I5032 Chronic diastolic (congestive) heart failure: Secondary | ICD-10-CM | POA: Diagnosis not present

## 2021-12-03 DIAGNOSIS — N179 Acute kidney failure, unspecified: Secondary | ICD-10-CM | POA: Diagnosis not present

## 2021-12-03 DIAGNOSIS — M25511 Pain in right shoulder: Secondary | ICD-10-CM

## 2021-12-03 DIAGNOSIS — M109 Gout, unspecified: Secondary | ICD-10-CM | POA: Diagnosis present

## 2021-12-03 DIAGNOSIS — R778 Other specified abnormalities of plasma proteins: Secondary | ICD-10-CM | POA: Diagnosis not present

## 2021-12-03 LAB — BASIC METABOLIC PANEL
Anion gap: 10 (ref 5–15)
BUN: 77 mg/dL — ABNORMAL HIGH (ref 8–23)
CO2: 24 mmol/L (ref 22–32)
Calcium: 8.8 mg/dL — ABNORMAL LOW (ref 8.9–10.3)
Chloride: 105 mmol/L (ref 98–111)
Creatinine, Ser: 2.08 mg/dL — ABNORMAL HIGH (ref 0.61–1.24)
GFR, Estimated: 28 mL/min — ABNORMAL LOW (ref >60)
Glucose, Bld: 175 mg/dL — ABNORMAL HIGH (ref 70–99)
Potassium: 4.2 mmol/L (ref 3.5–5.1)
Sodium: 139 mmol/L (ref 135–145)

## 2021-12-03 LAB — CBC
HCT: 32.6 % — ABNORMAL LOW (ref 39.0–52.0)
Hemoglobin: 10.1 g/dL — ABNORMAL LOW (ref 13.0–17.0)
MCH: 28.7 pg (ref 26.0–34.0)
MCHC: 31 g/dL (ref 30.0–36.0)
MCV: 92.6 fL (ref 80.0–100.0)
Platelets: 120 10*3/uL — ABNORMAL LOW (ref 150–400)
RBC: 3.52 MIL/uL — ABNORMAL LOW (ref 4.22–5.81)
RDW: 16 % — ABNORMAL HIGH (ref 11.5–15.5)
WBC: 4.3 10*3/uL (ref 4.0–10.5)
nRBC: 0 % (ref 0.0–0.2)

## 2021-12-03 MED ORDER — PREDNISONE 10 MG PO TABS: 30.0000 mg | ORAL_TABLET | Freq: Every day | ORAL | 0 refills | Status: AC

## 2021-12-03 MED ORDER — POTASSIUM CHLORIDE ER 10 MEQ PO TBCR: 10.0000 meq | EXTENDED_RELEASE_TABLET | ORAL | Status: DC

## 2021-12-03 MED ORDER — FUROSEMIDE 20 MG PO TABS: 20.0000 mg | ORAL_TABLET | Freq: Every day | ORAL | 2 refills | Status: AC

## 2021-12-03 MED ORDER — ALLOPURINOL 100 MG PO TABS: 50.0000 mg | ORAL_TABLET | Freq: Every day | ORAL | 1 refills | Status: AC

## 2021-12-03 NOTE — Evaluation (Signed)
Occupational Therapy Evaluation Patient Details Name: Nathan Kim MRN: [id number] DOB: May 06, 1920 Today's Date: 12/03/2021   History of Present Illness Nathan Kim is a 88 y.o. male with medical history significant of CKD 3B, colon cancer status postresection 70/35, BPH, diastolic congestive heart failure, paroxysmal atrial fibrillation, hypothyroidism.  Patient lives at home with his daughter.  He presents to the emergency room today with complaints of right shoulder pain.  Pain has been present intermittently for the past few days.  Since yesterday, it has been persistent, severe and has been unable to raise his arm.  Denies any fall, trauma, wounds.  He has not had a fever.  His oral intake has been normal for him.  He has not had any melena or hematochezia.  No diarrhea.  He takes chronic diuretics for lower extremity edema.  Work-up in the emergency room including x-ray of his right shoulder did not show any significant findings.  Chest x-ray report indicates new diffuse interstitial opacity which could reflect pulmonary edema versus atypical infection.  Patient has not had any cough, shortness of breath or fever.  Basic labs were checked and showed elevated BUN in the 80s.  Creatinine also increased to 2.2 from a baseline of 1.7.  Mild elevation of troponin at 113.   Clinical Impression   Pt agreeable to OT and PT co-evaluation. Pt initially very limited in R UE active range of motions and ~50% limited in shoulder P/ROM. In session doctor reporting that pt is having pain due to gout. Pt showed increase in functional use of R UE bearing weight during use of RW with new full extension of elbow. Pt minimally assisted for ADL's at baseline by family. Pt will benefit from home health OT to ensure safety and that pain and normal A/ROM of R UE resolve once gout is effectively treated. Pt will be discharged to care of nursing staff for remaining length of stay.      Recommendations for follow up  therapy are one component of a multi-disciplinary discharge planning process, led by the attending physician.  Recommendations may be updated based on patient status, additional functional criteria and insurance authorization.   Follow Up Recommendations  Home health OT    Assistance Recommended at Discharge Frequent or constant Supervision/Assistance  Patient can return home with the following A little help with walking and/or transfers;A little help with bathing/dressing/bathroom;Assistance with cooking/housework;Assist for transportation;Help with stairs or ramp for entrance    Functional Status Assessment  Patient has had a recent decline in their functional status and demonstrates the ability to make significant improvements in function in a reasonable and predictable amount of time.  Equipment Recommendations  None recommended by OT    Recommendations for Other Services       Precautions / Restrictions Precautions Precautions: Fall Restrictions Weight Bearing Restrictions: No      Mobility Bed Mobility Overal bed mobility: Needs Assistance Bed Mobility: Supine to Sit     Supine to sit: Min assist     General bed mobility comments: Mild labored movement.    Transfers Overall transfer level: Needs assistance Equipment used: Rolling walker (2 wheels) Transfers: Sit to/from Stand, Bed to chair/wheelchair/BSC Sit to Stand: Min guard, Min assist     Step pivot transfers: Min guard, Min assist     General transfer comment: Min G to min A for intiail boost from EOB. Once standing more min G assist using RW.      Balance Overall balance assessment: Needs assistance Sitting-balance  support: No upper extremity supported, Feet supported Sitting balance-Leahy Scale: Fair Sitting balance - Comments: fair to good seated EOB   Standing balance support: Bilateral upper extremity supported, During functional activity, Reliant on assistive device for balance Standing  balance-Leahy Scale: Fair Standing balance comment: using RW                           ADL either performed or assessed with clinical judgement   ADL Overall ADL's : Needs assistance/impaired     Grooming: Set up;Sitting           Upper Body Dressing : Minimal assistance;Moderate assistance;Sitting   Lower Body Dressing: Moderate assistance;Sitting/lateral leans   Toilet Transfer: Min guard;Rolling walker (2 wheels);Minimal assistance Toilet Transfer Details (indicate cue type and reason): Simulated via EOB to chair transfer Toileting- Clothing Manipulation and Hygiene: Minimal assistance;Moderate assistance;Sitting/lateral lean       Functional mobility during ADLs: Supervision/safety;Min guard General ADL Comments: Pt limited due to R UE pain. Pt reported that family assist with some ADL's at baseline.     Vision Baseline Vision/History: 1 Wears glasses Ability to See in Adequate Light: 1 Impaired Patient Visual Report: No change from baseline Vision Assessment?: No apparent visual deficits                Pertinent Vitals/Pain Pain Assessment Pain Assessment: Faces Faces Pain Scale: Hurts even more Pain Location: R shoulder to elbow Pain Descriptors / Indicators: Grimacing, Sore Pain Intervention(s): Limited activity within patient's tolerance, Monitored during session, Repositioned     Hand Dominance Right   Extremity/Trunk Assessment Upper Extremity Assessment Upper Extremity Assessment: RUE deficits/detail;LUE deficits/detail RUE Deficits / Details: Pt limited in assessment due to pain that is reported to be related to gout by treating docotr. Pt able to functionally use R UE on RW with only tolerated 90* of P/ROM to shoulder for flexion. P/ROm of elbow ~75% of available range. Good fine motor coordination. RUE Coordination: decreased gross motor (due to pain) LUE Deficits / Details: Gnerally weak but able to use functinoally. LUE Coordination:  WNL   Lower Extremity Assessment Lower Extremity Assessment: Defer to PT evaluation   Cervical / Trunk Assessment Cervical / Trunk Assessment: Kyphotic   Communication Communication Communication: HOH   Cognition Arousal/Alertness: Awake/alert Behavior During Therapy: WFL for tasks assessed/performed Overall Cognitive Status: Within Functional Limits for tasks assessed                                                        Home Living Family/patient expects to be discharged to:: Private residence Living Arrangements: Spouse/significant other Available Help at Discharge: Family;Available 24 hours/day Type of Home: House Home Access: Stairs to enter [employer] of Steps: 3 Entrance Stairs-Rails: Right;Left;Can reach both Home Layout: One level     Bathroom Shower/Tub: Teacher, early years/pre: Standard Bathroom Accessibility: No   Home Equipment: [place] Walker (2 wheels);Rollator (4 wheels);Shower seat;Hand held shower head   Additional Comments: Daughter reported no change in home living report since last admission.      Prior Functioning/Environment Prior Level of Function : Needs assist       Physical Assist : Mobility (physical);ADLs (physical) Mobility (physical): Transfers;Gait ADLs (physical): Dressing;Bathing;IADLs Mobility Comments: pt uses RW for household ambulation  ADLs Comments: Pt is able to groom, eat, and toilet without assist. Min A for bathing and dressing. IADL's completed by family.        OT Problem List: Decreased strength;Decreased range of motion;Decreased activity tolerance;Impaired balance (sitting and/or standing);Impaired UE functional use      OT Treatment/Interventions:      OT Goals(Current goals can be found in the care plan section) Acute Rehab OT Goals Patient Stated Goal: return home  OT Frequency:      Co-evaluation PT/OT/SLP Co-Evaluation/Treatment: Yes Reason for  Co-Treatment: To address functional/ADL transfers   OT goals addressed during session: ADL's and self-care      AM-PAC OT "6 Clicks" Daily Activity     Outcome Measure Help from another person eating meals?: None Help from another person taking care of personal grooming?: A Little Help from another person toileting, which includes using toliet, bedpan, or urinal?: A Little Help from another person bathing (including washing, rinsing, drying)?: A Little Help from another person to put on and taking off regular upper body clothing?: A Little Help from another person to put on and taking off regular lower body clothing?: A Little 6 Click Score: 19   End of Session Equipment Utilized During Treatment: Rolling walker (2 wheels)  Activity Tolerance: Patient tolerated treatment well Patient left: in chair;with call bell/phone within reach;with family/visitor present  OT Visit Diagnosis: Unsteadiness on feet (R26.81);Other abnormalities of gait and mobility (R26.89);Muscle weakness (generalized) (M62.81);Pain Pain - Right/Left: Right Pain - part of body: Shoulder                Time: 1287-8676 OT Time Calculation (min): 17 min Charges:  OT General Charges $OT Visit: 1 Visit OT Evaluation $OT Eval Low Complexity: 1 Low  Girard Koontz OT, MOT  Larey Seat 12/03/2021, 9:57 AM

## 2021-12-03 NOTE — Evaluation (Signed)
Physical Therapy Evaluation Patient Details Name: Nathan Kim MRN: [id number] DOB: 04/07/1920 Today's Date: 12/03/2021  History of Present Illness  Nathan Kim is a 88 y.o. male with medical history significant of CKD 3B, colon cancer status postresection 79/48, BPH, diastolic congestive heart failure, paroxysmal atrial fibrillation, hypothyroidism.  Patient lives at home with his daughter.  He presents to the emergency room today with complaints of right shoulder pain.  Pain has been present intermittently for the past few days.  Since yesterday, it has been persistent, severe and has been unable to raise his arm.  Denies any fall, trauma, wounds.  He has not had a fever.  His oral intake has been normal for him.  He has not had any melena or hematochezia.  No diarrhea.  He takes chronic diuretics for lower extremity edema.  Work-up in the emergency room including x-ray of his right shoulder did not show any significant findings.  Chest x-ray report indicates new diffuse interstitial opacity which could reflect pulmonary edema versus atypical infection.  Patient has not had any cough, shortness of breath or fever.  Basic labs were checked and showed elevated BUN in the 80s.  Creatinine also increased to 2.2 from a baseline of 1.7.  Mild elevation of troponin at 113.   Clinical Impression  Patient functioning near baseline for functional mobility and gait other than c/o severe pain right shoulder requiring Min assist for sitting up at bedside, once standing demonstrates fair/good return for ambulating in room and hallway without loss of balance limited mostly due to fatigue.  Patient tolerated sitting up in chair after therapy with his daughter present in room.  PLAN:  Patient to be discharged home today and discharged from acute physical therapy to care of nursing for ambulation as tolerated for length of stay with recommendations stated below          Recommendations for follow up therapy  are one component of a multi-disciplinary discharge planning process, led by the attending physician.  Recommendations may be updated based on patient status, additional functional criteria and insurance authorization.  Follow Up Recommendations Home health PT    Assistance Recommended at Discharge Set up Supervision/Assistance  Patient can return home with the following  A little help with walking and/or transfers;A little help with bathing/dressing/bathroom;Help with stairs or ramp for entrance;Assistance with cooking/housework    Equipment Recommendations None recommended by PT  Recommendations for Other Services       Functional Status Assessment Patient has had a recent decline in their functional status and demonstrates the ability to make significant improvements in function in a reasonable and predictable amount of time.     Precautions / Restrictions Precautions Precautions: Fall Restrictions Weight Bearing Restrictions: No      Mobility  Bed Mobility               General bed mobility comments: as per OT notes    Transfers                   General transfer comment: as per OT notes    Ambulation/Gait Ambulation/Gait assistance: Supervision Gait Distance (Feet): 85 Feet Assistive device: Rolling walker (2 wheels) Gait Pattern/deviations: Decreased step length - right, Decreased step length - left, Decreased stride length, Trunk flexed Gait velocity: decreased     General Gait Details: slightly labored cadence without loss of balance and limited mostly due to fatigue  Stairs            Wheelchair  Mobility    Modified Rankin (Stroke Patients Only)       Balance Overall balance assessment: Needs assistance Sitting-balance support: Feet supported, No upper extremity supported Sitting balance-Leahy Scale: Fair Sitting balance - Comments: fair/good seated at EOB   Standing balance support: Bilateral upper extremity supported, During  functional activity, Reliant on assistive device for balance Standing balance-Leahy Scale: Fair Standing balance comment: using RW                             Pertinent Vitals/Pain Pain Assessment Pain Assessment: Faces Faces Pain Scale: Hurts even more Pain Location: R shoulder to elbow Pain Descriptors / Indicators: Grimacing, Sore Pain Intervention(s): Limited activity within patient's tolerance, Monitored during session, Repositioned    Home Living Family/patient expects to be discharged to:: Private residence Living Arrangements: Spouse/significant other Available Help at Discharge: Family;Available 24 hours/day Type of Home: House Home Access: Stairs to enter Entrance Stairs-Rails: Right;Left;Can reach both Entrance Stairs-Number of Steps: 3   Home Layout: One level Home Equipment: [place] Walker (2 wheels);Rollator (4 wheels);Shower seat;Hand held shower head Additional Comments: Daughter reported no change in home living report since last admission.    Prior Function Prior Level of Function : Needs assist       Physical Assist : Mobility (physical);ADLs (physical) Mobility (physical): Transfers;Gait;Bed mobility;Stairs ADLs (physical): Dressing;Bathing;IADLs Mobility Comments: household ambulator using RW ADLs Comments: Pt is able to groom, eat, and toilet without assist. Min A for bathing and dressing. IADL's completed by family.     Hand Dominance   Dominant Hand: Right    Extremity/Trunk Assessment   Upper Extremity Assessment Upper Extremity Assessment: Defer to OT evaluation RUE Deficits / Details: Pt limited in assessment due to pain that is reported to be related to gout by treating docotr. Pt able to functionally use R UE on RW with only tolerated 90* of P/ROM to shoulder for flexion. P/ROm of elbow ~75% of available range. Good fine motor coordination. RUE Coordination: decreased gross motor (due to pain) LUE Deficits / Details:  Gnerally weak but able to use functinoally. LUE Coordination: WNL    Lower Extremity Assessment Lower Extremity Assessment: Generalized weakness    Cervical / Trunk Assessment Cervical / Trunk Assessment: Kyphotic  Communication   Communication: HOH  Cognition Arousal/Alertness: Awake/alert Behavior During Therapy: WFL for tasks assessed/performed Overall Cognitive Status: Within Functional Limits for tasks assessed                                          General Comments      Exercises     Assessment/Plan    PT Assessment All further PT needs can be met in the next venue of care  PT Problem List Decreased strength;Decreased activity tolerance;Decreased balance;Decreased mobility       PT Treatment Interventions      PT Goals (Current goals can be found in the Care Plan section)  Acute Rehab PT Goals Patient Stated Goal: return home with family to asssit PT Goal Formulation: With patient/family Time For Goal Achievement: 12/03/21 Potential to Achieve Goals: Good    Frequency       Co-evaluation PT/OT/SLP Co-Evaluation/Treatment: Yes Reason for Co-Treatment: Complexity of the patient's impairments (multi-system involvement);To address functional/ADL transfers   OT goals addressed during session: ADL's and self-care  AM-PAC PT "6 Clicks" Mobility  Outcome Measure Help needed turning from your back to your side while in a flat bed without using bedrails?: A Little Help needed moving from lying on your back to sitting on the side of a flat bed without using bedrails?: A Little Help needed moving to and from a bed to a chair (including a wheelchair)?: A Little Help needed standing up from a chair using your arms (e.g., wheelchair or bedside chair)?: A Little Help needed to walk in hospital room?: A Little Help needed climbing 3-5 steps with a railing? : A Little 6 Click Score: 18    End of Session   Activity Tolerance: Patient  tolerated treatment well;Patient limited by fatigue Patient left: in chair;with call bell/phone within reach Nurse Communication: Mobility status PT Visit Diagnosis: Unsteadiness on feet (R26.81);Other abnormalities of gait and mobility (R26.89);Muscle weakness (generalized) (M62.81)    Time: 0220-2669 PT Time Calculation (min) (ACUTE ONLY): 21 min   Charges:   PT Evaluation $PT Eval Moderate Complexity: 1 Mod PT Treatments $Therapeutic Activity: 8-22 mins        11:55 AM, 12/03/21 Lonell Grandchild, MPT Physical Therapist with [facility] 336 [phone] office [phone] mobile phone

## 2021-12-03 NOTE — TOC Transition Note (Signed)
Transition of Care (TOC) - CM/SW Discharge Note ? ? ?Patient Details  ?Name: Nathan Kim ?MRN: [id number] ?Date of Birth: September 16, 1920 ? ?Transition of Care (TOC) CM/SW Contact:  ?Shade Flood, LCSW ?Phone Number: ?12/03/2021, 12:07 PM ? ? ?Clinical Narrative:    ? ?Pt stable to return home with resumption of Palliative services and new referral for HHPT. Spoke with pt's daughter to review [facility] CMS provider options. Referred to [facility] as requested. There are no other TOC needs for dc. ? ?Expected Discharge Plan: [place] ?Barriers to Discharge: Barriers Resolved ? ? ?Patient Goals and CMS Choice ?Patient states their goals for this hospitalization and ongoing recovery are:: go home ?CMS Medicare.gov Compare Post Acute Care list provided to:: Patient Represenative (must comment) ?Choice offered to / list presented to : Adult Children ? ?Expected Discharge Plan and Services ?Expected Discharge Plan: [place] ?In-house Referral: Clinical Social Work ?  ?Post Acute Care Choice: Home Health ?Living arrangements for the past 2 months: [place] ?Expected Discharge Date: 12/03/21               ?  ?  ?  ?  ?  ?HH Arranged: PT ?[place] Agency: [place] ?Date HH Agency Contacted: 12/03/21 ?  ?Representative spoke with at [place]: Tommi Rumps ? ?Prior Living Arrangements/Services ?Living arrangements for the past 2 months: [place] ?Lives with:: Adult Children ?Patient language and need for interpreter reviewed:: Yes ?Do you feel safe going back to the place where you live?: Yes      ?Need for Family Participation in Patient Care: Yes (Comment) ?Care giver support system in place?: Yes (comment) ?Current home services: DME ?Criminal Activity/Legal Involvement Pertinent to Current Situation/Hospitalization: No - Comment as needed ? ?Activities of Daily Living ?Home Assistive Devices/Equipment: Gilford Rile (specify type) ?ADL Screening (condition at time of  admission) ?Patient's cognitive ability adequate to safely complete daily activities?: No ?Is the patient deaf or have difficulty hearing?: Yes ?Does the patient have difficulty seeing, even when wearing glasses/contacts?: Yes ?Does the patient have difficulty concentrating, remembering, or making decisions?: Yes ?Patient able to express need for assistance with ADLs?: Yes ?Does the patient have difficulty dressing or bathing?: Yes ?Independently performs ADLs?: No ?Does the patient have difficulty walking or climbing stairs?: Yes ?Weakness of Legs: Both ?Weakness of Arms/Hands: Both ? ?Permission Sought/Granted ?Permission sought to share information with : Customer service manager ?Permission granted to share information with : Yes, Verbal Permission Granted ?   ? Permission granted to share info w AGENCY: Alvis Lemmings ?   ?   ? ?Emotional Assessment ?  ?  ?  ?Orientation: : Oriented to Self, Oriented to Place, Oriented to Situation ?Alcohol / Substance Use: Not Applicable ?Psych Involvement: No (comment) ? ?Admission diagnosis:  Dehydration [E86.0] ?Elevated troponin [R77.8] ?AKI (acute kidney injury) ([place]) [N17.9] ?Acute kidney injury ([place]) [N17.9] ?Patient Active Problem List  ? Diagnosis Date Noted  ? Acute gout 12/03/2021  ? AKI (acute kidney injury) ([place]) 12/02/2021  ? Stage 3b chronic kidney disease (CKD) ([place]) 12/02/2021  ? Right shoulder pain 12/02/2021  ? Protein-calorie malnutrition, severe 08/05/2021  ? SBO (small bowel obstruction) ([place]) 08/02/2021  ? Chronic diastolic CHF (congestive heart failure) ([place]) 08/02/2021  ? Hypothyroidism 06/06/2021  ? Ileus ([place]) 06/05/2021  ? CKD (chronic kidney disease), stage IV ([place]) 06/05/2021  ? Normocytic anemia 06/05/2021  ? Elevated troponin 06/05/2021  ? S/P right THA, AA 05/24/2017  ? Lumbar  stenosis with neurogenic claudication 02/27/2016  ? PAF (paroxysmal atrial fibrillation) ([place]) 02/25/2016  ? Essential hypertension 02/25/2016  ? Preoperative clearance  02/25/2016  ? PVC's (premature ventricular contractions) 02/25/2016  ? First degree heart block 02/25/2016  ? ?PCP:  [name], MD ?Pharmacy:   ?[facility] [place] ?[address] ?[address] ?[place] [address] ?Phone: [phone] Fax: [phone] ? ?[place] ?[place] [address] ?Phone: [phone] Fax: [phone] ? ?[facility] [address] ?[place] [address] ?Phone: [phone] Fax: [phone] ? ?[place] ?[place] [address] ?Phone: [phone] Fax: [phone] ? ? ? ? ?Social Determinants of Health (SDOH) Interventions ?  ? ?Readmission Risk Interventions ?No flowsheet data found. ? ? ? ?Final next level of care: [place] ?Barriers to Discharge: Barriers Resolved ? ? ?Patient Goals and CMS Choice ?Patient states their goals for this hospitalization and ongoing recovery are:: go home ?CMS Medicare.gov Compare Post Acute Care list provided to:: Patient Represenative (must comment) ?Choice offered to / list presented to : Adult Children ? ?Discharge Placement ?  ?           ?  ?  ?  ?  ? ?Discharge Plan and Services ?In-house Referral: Clinical Social Work ?  ?Post Acute Care Choice: Home Health          ?  ?  ?  ?  ?  ?HH Arranged: PT ?[place] Agency: [place] ?Date HH Agency Contacted: 12/03/21 ?  ?Representative spoke with at [place]: Tommi Rumps ? ?Social Determinants of Health (SDOH) Interventions ?  ? ? ?Readmission Risk Interventions ?No flowsheet data found. ? ? ? ? ?

## 2021-12-03 NOTE — Plan of Care (Signed)

## 2021-12-03 NOTE — Hospital Course (Signed)
88 y.o. male with medical history significant of CKD 3B, colon cancer status postresection 28/63, BPH, diastolic congestive heart failure, paroxysmal atrial fibrillation, hypothyroidism.  Patient lives at home with his daughter.  He presents to the emergency room today with complaints of right shoulder pain.  Pain has been present intermittently for the past few days.  Since yesterday, it has been persistent, severe and has been unable to raise his arm.  Denies any fall, trauma, wounds.  He has not had a fever.  His oral intake has been normal for him.  He has not had any melena or hematochezia.  No diarrhea.  He takes chronic diuretics for lower extremity edema.  Work-up in the emergency room including x-ray of his right shoulder did not show any significant findings.  Chest x-ray report indicates new diffuse interstitial opacity which could reflect pulmonary edema versus atypical infection.  Patient has not had any cough, shortness of breath or fever.  Basic labs were checked and showed elevated BUN in the 80s.  Creatinine also increased to 2.2 from a baseline of 1.7.  Mild elevation of troponin at 113. ? ?12/03/2021:  Pt reports pain improved immediately after starting prednisone.  He ambulated with PT.  He is eating and drinking well.  DC home today.  Resume home hospice services.   ?

## 2021-12-03 NOTE — TOC Progression Note (Signed)
?  Transition of Care (TOC) Screening Note ? ? ?Patient Details  ?Name: Nathan Kim ?Date of Birth: 07/25/1920 ? ? ?Transition of Care (TOC) CM/SW Contact:    ?Shade Flood, LCSW ?Phone Number: ?12/03/2021, 11:18 AM ? ? ? ?Transition of Care Department [facility]) has reviewed patient and no TOC needs have been identified at this time. We will continue to monitor patient advancement through interdisciplinary progression rounds. If new patient transition needs arise, please place a TOC consult. ? ? ?

## 2021-12-03 NOTE — Discharge Instructions (Signed)

## 2021-12-03 NOTE — Assessment & Plan Note (Signed)
Symptoms markedly improved after prednisone started.   ?Uric acid >12 ?Continue prednisone for 5 more days ?Reduce diuretics ?Allopurinol 50 mg daily starting 3/3 ? ?

## 2021-12-03 NOTE — Discharge Summary (Signed)
Physician Discharge Summary  Nathan Kim JSH:[id number] DOB: Dec 12, 1919 DOA: 12/02/2021  PCP: [name], MD Cardiologist:  Dr Domenic Polite  Admit date: 12/02/2021 Discharge date: 12/03/2021  Admitted From:  Home with hospice Disposition:  Home with hospice  Recommendations for Outpatient Follow-up:  Follow up with PCP in 1 weeks Continue daily weight checks Diuretic dosage reduced to try to avoid further gout attacks Please recheck uric acid level in 1 month for follow up of high levels  Home Health: resume home hospice services  Discharge Condition: home hospice appropriate   CODE STATUS: DNR DIET: heart healthy    Brief Hospitalization Summary: Please see all hospital notes, images, labs for full details of the hospitalization. 88 y.o. male with medical history significant of CKD 3B, colon cancer status postresection 85/02, BPH, diastolic congestive heart failure, paroxysmal atrial fibrillation, hypothyroidism.  Patient lives at home with his daughter.  He presents to the emergency room today with complaints of right shoulder pain.  Pain has been present intermittently for the past few days.  Since yesterday, it has been persistent, severe and has been unable to raise his arm.  Denies any fall, trauma, wounds.  He has not had a fever.  His oral intake has been normal for him.  He has not had any melena or hematochezia.  No diarrhea.  He takes chronic diuretics for lower extremity edema.  Work-up in the emergency room including x-ray of his right shoulder did not show any significant findings.  Chest x-ray report indicates new diffuse interstitial opacity which could reflect pulmonary edema versus atypical infection.  Patient has not had any cough, shortness of breath or fever.  Basic labs were checked and showed elevated BUN in the 80s.  Creatinine also increased to 2.2 from a baseline of 1.7.  Mild elevation of troponin at 113.  12/03/2021:  Pt reports pain improved immediately after  starting prednisone.  He ambulated with PT.  He is eating and drinking well.  DC home today.  Resume home hospice services.    Assessment and Plan: * Acute gout Symptoms markedly improved after prednisone started.   Uric acid >12 Continue prednisone for 5 more days Reduce diuretics Allopurinol 50 mg daily starting 3/3   Right shoulder pain Secondary to acute gout Pain much improved after prednisone started  Stage 3b chronic kidney disease (CKD) (HCC) Baseline creatinine of 1.7  AKI (acute kidney injury) ([place]) Creatinine increased to 2.2 with a BUN of 83 No reported history of melena or GI bleeding No recent steroids Started on IV hydration, follow urine output and renal function  Chronic diastolic CHF (congestive heart failure) (HCC) Currently appears to be compensated Held diuretics in light of elevated creatinine and dehydration He was taking high dose furosemide 40 mg TID which I have advised against given severe acute gout attack.  Reduced furosemide to 20 mg daily.  Monitor weights and share with PCP and cardiologist regarding need to change doses.    Essential hypertension Blood pressure currently stable on metoprolol  PAF (paroxysmal atrial fibrillation) (HCC) Heart rate is currently stable on metoprolol Followed by cardiology He is not on chronic anticoagulation per patient decision  Hypothyroidism Continue on levothyroxine  Elevated troponin Mild elevation of troponin, likely related to renal failure Seen by cardiology, no plans for invasive work-up Continue supportive care   Discharge Diagnoses:  Principal Problem:   Acute gout Active Problems:   Right shoulder pain   PAF (paroxysmal atrial fibrillation) (HCC)   Essential hypertension  Chronic diastolic CHF (congestive heart failure) (HCC)   AKI (acute kidney injury) (HCC)   Stage 3b chronic kidney disease (CKD) (HCC)   Elevated troponin   Hypothyroidism   Discharge Instructions:  Allergies as  of 12/03/2021   No Known Allergies      Medication List     STOP taking these medications    doxycycline 100 MG tablet Commonly known as: VIBRA-TABS       TAKE these medications    allopurinol 100 MG tablet Commonly known as: ZYLOPRIM Take 0.5 tablets (50 mg total) by mouth daily. Start taking on: December 04, 2021   aspirin EC 81 MG tablet Take 81 mg by mouth every other day.   finasteride 5 MG tablet Commonly known as: PROSCAR Take 1 tablet by mouth daily.   furosemide 20 MG tablet Commonly known as: LASIX Take 1 tablet (20 mg total) by mouth daily. Start taking on: December 04, 2021 What changed:  how much to take how to take this when to take this additional instructions   levothyroxine 50 MCG tablet Commonly known as: SYNTHROID Take 50 mcg by mouth daily before breakfast.   Melatonin 10 MG Tabs Take 10 mg by mouth at bedtime.   metoprolol tartrate 25 MG tablet Commonly known as: LOPRESSOR TAKE ONE-HALF TABLET BY MOUTH TWICE DAILY. GENERIC EQUIVALENT FOR LOPRESSOR What changed:  how much to take how to take this when to take this additional instructions   potassium chloride 10 MEQ tablet Commonly known as: KLOR-CON Take 1 tablet (10 mEq total) by mouth every other day. Start taking on: December 04, 2021 What changed: when to take this   predniSONE 10 MG tablet Commonly known as: DELTASONE Take 3 tablets (30 mg total) by mouth daily with breakfast for 5 days. Start taking on: December 04, 2021   senna-docusate 8.6-50 MG tablet Commonly known as: Senokot-S Take 1 tablet by mouth 2 (two) times daily between meals as needed for mild constipation.   tamsulosin 0.4 MG Caps capsule Commonly known as: FLOMAX Take 0.8 mg by mouth daily.   triamcinolone cream 0.1 % Commonly known as: KENALOG Apply 1 application topically 2 (two) times daily as needed (itching).   UNABLE TO FIND Take 1 tablet by mouth every other day. Med Name: Tawnya Crook WITH IRON         Follow-up Information     [name], MD. Schedule an appointment as soon as possible for a visit in 1 week(s).   Specialty: Internal Medicine Why: Hospital Follow Up Contact information: [place] [address] [phone]         Satira Sark, MD. Schedule an appointment as soon as possible for a visit in 2 week(s).   Specialty: Cardiology Why: Hospital Follow Up Contact information: [place] [address] [phone]                No Known Allergies Allergies as of 12/03/2021   No Known Allergies      Medication List     STOP taking these medications    doxycycline 100 MG tablet Commonly known as: VIBRA-TABS       TAKE these medications    allopurinol 100 MG tablet Commonly known as: ZYLOPRIM Take 0.5 tablets (50 mg total) by mouth daily. Start taking on: December 04, 2021   aspirin EC 81 MG tablet Take 81 mg by mouth every other day.   finasteride 5 MG tablet Commonly known as:  PROSCAR Take 1 tablet by mouth daily.   furosemide 20 MG tablet Commonly known as: LASIX Take 1 tablet (20 mg total) by mouth daily. Start taking on: December 04, 2021 What changed:  how much to take how to take this when to take this additional instructions   levothyroxine 50 MCG tablet Commonly known as: SYNTHROID Take 50 mcg by mouth daily before breakfast.   Melatonin 10 MG Tabs Take 10 mg by mouth at bedtime.   metoprolol tartrate 25 MG tablet Commonly known as: LOPRESSOR TAKE ONE-HALF TABLET BY MOUTH TWICE DAILY. GENERIC EQUIVALENT FOR LOPRESSOR What changed:  how much to take how to take this when to take this additional instructions   potassium chloride 10 MEQ tablet Commonly known as: KLOR-CON Take 1 tablet (10 mEq total) by mouth every other day. Start taking on: December 04, 2021 What changed: when to take this   predniSONE 10 MG tablet Commonly known as: DELTASONE Take 3 tablets (30 mg total) by mouth daily with  breakfast for 5 days. Start taking on: December 04, 2021   senna-docusate 8.6-50 MG tablet Commonly known as: Senokot-S Take 1 tablet by mouth 2 (two) times daily between meals as needed for mild constipation.   tamsulosin 0.4 MG Caps capsule Commonly known as: FLOMAX Take 0.8 mg by mouth daily.   triamcinolone cream 0.1 % Commonly known as: KENALOG Apply 1 application topically 2 (two) times daily as needed (itching).   UNABLE TO FIND Take 1 tablet by mouth every other day. Med Name: Tawnya Crook WITH IRON        Procedures/Studies: DG Shoulder Right  Result Date: 12/02/2021 CLINICAL DATA:  Right shoulder pain. EXAM: RIGHT SHOULDER - 2+ VIEW COMPARISON:  None. FINDINGS: No acute fracture or dislocation. Mild acromioclavicular joint space narrowing with small marginal osteophytes. The glenohumeral joint space is relatively preserved. Faint amorphous mineralization/calcification overlying the greater tuberosity and within the axillary recess. Osteopenia. IMPRESSION: 1. Mild acromioclavicular osteoarthritis. 2. 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 Electronically Signed   By: Titus Dubin M.D.   On: 12/02/2021 13:09   DG Chest Port 1 View  Result Date: 12/02/2021 CLINICAL DATA:  Diffuse body aches. EXAM: PORTABLE CHEST 1 VIEW COMPARISON:  Chest x-ray dated January 10, 2020. FINDINGS: Unchanged borderline cardiomegaly. New diffuse interstitial opacity. No consolidation, pneumothorax, or large pleural effusion. No acute osseous abnormality. IMPRESSION: 1. New diffuse interstitial opacity could reflect pulmonary edema or atypical infection. Electronically Signed   By: Titus Dubin M.D.   On: 12/02/2021 13:00     Subjective: Shoulder pain much improved after steroids started.  Ambulated with PT, eating and drinking well.   Discharge Exam: Vitals:   12/03/21 0114 12/03/21 0826  BP: 123/72  105/64  Pulse: 69 61  Resp:    Temp: 98.2 F (36.8 C)   SpO2: 97% 92%   Vitals:   12/02/21 2029 12/02/21 2100 12/03/21 0114 12/03/21 0826  BP: 124/74  123/72 105/64  Pulse: (!) 57  69 61  Resp:      Temp: (!) 97.3 F (36.3 C)  98.2 F (36.8 C)   TempSrc: Oral  Oral   SpO2: (!) 89% 97% 97% 92%  Weight:      Height:       General: Pt is alert, awake, not in acute distress Cardiovascular: RRR, S1/S2 +, no rubs, no gallops Respiratory: CTA bilaterally, no wheezing, no rhonchi Abdominal: Soft, NT, ND, bowel sounds + Extremities: good  range of motion in shoulders   The results of significant diagnostics from this hospitalization (including imaging, microbiology, ancillary and laboratory) are listed below for reference.     Microbiology: Recent Results (from the past 240 hour(s))  Resp Panel by RT-PCR (Flu A&B, Covid) Nasopharyngeal Swab     Status: None   Collection Time: 12/02/21  2:36 PM   Specimen: Nasopharyngeal Swab; Nasopharyngeal(NP) swabs in vial transport medium  Result Value Ref Range Status   SARS Coronavirus 2 by RT PCR NEGATIVE NEGATIVE Final    Comment: (NOTE) SARS-CoV-2 target nucleic acids are NOT DETECTED.  The SARS-CoV-2 RNA is generally detectable in upper respiratory specimens during the acute phase of infection. The lowest concentration of SARS-CoV-2 viral copies this assay can detect is 138 copies/mL. A negative result does not preclude SARS-Cov-2 infection and should not be used as the sole basis for treatment or other patient management decisions. A negative result may occur with  improper specimen collection/handling, submission of specimen other than nasopharyngeal swab, presence of viral mutation(s) within the areas targeted by this assay, and inadequate number of viral copies(<138 copies/mL). A negative result must be combined with clinical observations, patient history, and epidemiological information. The expected result is Negative.  Fact  Sheet for Patients:  EntrepreneurPulse.com.au  Fact Sheet for Healthcare Providers:  IncredibleEmployment.be  This test is no t yet approved or cleared by the [place] FDA and  has been authorized for detection and/or diagnosis of SARS-CoV-2 by FDA under an Emergency Use Authorization (EUA). This EUA will remain  in effect (meaning this test can be used) for the duration of the COVID-19 declaration under Section 564(b)(1) of the Act, 21 U.S.C.section 360bbb-3(b)(1), unless the authorization is terminated  or revoked sooner.       Influenza A by PCR NEGATIVE NEGATIVE Final   Influenza B by PCR NEGATIVE NEGATIVE Final    Comment: (NOTE) The Xpert Xpress SARS-CoV-2/FLU/RSV plus assay is intended as an aid in the diagnosis of influenza from Nasopharyngeal swab specimens and should not be used as a sole basis for treatment. Nasal washings and aspirates are unacceptable for Xpert Xpress SARS-CoV-2/FLU/RSV testing.  Fact Sheet for Patients: EntrepreneurPulse.com.au  Fact Sheet for Healthcare Providers: IncredibleEmployment.be  This test is not yet approved or cleared by the [place] FDA and has been authorized for detection and/or diagnosis of SARS-CoV-2 by FDA under an Emergency Use Authorization (EUA). This EUA will remain in effect (meaning this test can be used) for the duration of the COVID-19 declaration under Section 564(b)(1) of the Act, 21 U.S.C. section 360bbb-3(b)(1), unless the authorization is terminated or revoked.  Performed at [facility], [address], [place], [place] [address]      Labs: BNP (last 3 results) No results for input(s): BNP in the last 8760 hours. Basic Metabolic Panel: Recent Labs  Lab 12/02/21 1216 12/03/21 0555  NA 137 139  K 4.0 4.2  CL 101 105  CO2 21* 24  GLUCOSE 169* 175*  BUN 83* 77*  CREATININE 2.22* 2.08*  CALCIUM 9.2 8.8*   Liver Function  Tests: Recent Labs  Lab 12/02/21 1216  AST 21  ALT 13  ALKPHOS 112  BILITOT 1.0  PROT 7.3  ALBUMIN 4.2   Recent Labs  Lab 12/02/21 1216  LIPASE 37   No results for input(s): AMMONIA in the last 168 hours. CBC: Recent Labs  Lab 12/02/21 1216 12/03/21 0555  WBC 5.1 4.3  NEUTROABS 4.0  --   HGB 10.9* 10.1*  HCT 34.1* 32.6*  MCV 90.9 92.6  PLT 127* 120*   Cardiac Enzymes: No results for input(s): CKTOTAL, CKMB, CKMBINDEX, TROPONINI in the last 168 hours. BNP: Invalid input(s): POCBNP CBG: No results for input(s): GLUCAP in the last 168 hours. D-Dimer No results for input(s): DDIMER in the last 72 hours. Hgb A1c No results for input(s): HGBA1C in the last 72 hours. Lipid Profile No results for input(s): CHOL, HDL, LDLCALC, TRIG, CHOLHDL, LDLDIRECT in the last 72 hours. Thyroid function studies No results for input(s): TSH, T4TOTAL, T3FREE, THYROIDAB in the last 72 hours.  Invalid input(s): FREET3 Anemia work up No results for input(s): VITAMINB12, FOLATE, FERRITIN, TIBC, IRON, RETICCTPCT in the last 72 hours. Urinalysis    Component Value Date/Time   COLORURINE YELLOW 12/02/2021 1215   APPEARANCEUR CLEAR 12/02/2021 1215   LABSPEC 1.009 12/02/2021 1215   PHURINE 5.0 12/02/2021 1215   GLUCOSEU NEGATIVE 12/02/2021 1215   HGBUR NEGATIVE 12/02/2021 1215   [place] 12/02/2021 1215   KETONESUR NEGATIVE 12/02/2021 1215   PROTEINUR NEGATIVE 12/02/2021 1215   NITRITE NEGATIVE 12/02/2021 1215   LEUKOCYTESUR NEGATIVE 12/02/2021 1215   Sepsis Labs Invalid input(s): PROCALCITONIN,  WBC,  LACTICIDVEN Microbiology Recent Results (from the past 240 hour(s))  Resp Panel by RT-PCR (Flu A&B, Covid) Nasopharyngeal Swab     Status: None   Collection Time: 12/02/21  2:36 PM   Specimen: Nasopharyngeal Swab; Nasopharyngeal(NP) swabs in vial transport medium  Result Value Ref Range Status   SARS Coronavirus 2 by RT PCR NEGATIVE NEGATIVE Final    Comment:  (NOTE) SARS-CoV-2 target nucleic acids are NOT DETECTED.  The SARS-CoV-2 RNA is generally detectable in upper respiratory specimens during the acute phase of infection. The lowest concentration of SARS-CoV-2 viral copies this assay can detect is 138 copies/mL. A negative result does not preclude SARS-Cov-2 infection and should not be used as the sole basis for treatment or other patient management decisions. A negative result may occur with  improper specimen collection/handling, submission of specimen other than nasopharyngeal swab, presence of viral mutation(s) within the areas targeted by this assay, and inadequate number of viral copies(<138 copies/mL). A negative result must be combined with clinical observations, patient history, and epidemiological information. The expected result is Negative.  Fact Sheet for Patients:  EntrepreneurPulse.com.au  Fact Sheet for Healthcare Providers:  IncredibleEmployment.be  This test is no t yet approved or cleared by the [place] FDA and  has been authorized for detection and/or diagnosis of SARS-CoV-2 by FDA under an Emergency Use Authorization (EUA). This EUA will remain  in effect (meaning this test can be used) for the duration of the COVID-19 declaration under Section 564(b)(1) of the Act, 21 U.S.C.section 360bbb-3(b)(1), unless the authorization is terminated  or revoked sooner.       Influenza A by PCR NEGATIVE NEGATIVE Final   Influenza B by PCR NEGATIVE NEGATIVE Final    Comment: (NOTE) The Xpert Xpress SARS-CoV-2/FLU/RSV plus assay is intended as an aid in the diagnosis of influenza from Nasopharyngeal swab specimens and should not be used as a sole basis for treatment. Nasal washings and aspirates are unacceptable for Xpert Xpress SARS-CoV-2/FLU/RSV testing.  Fact Sheet for Patients: EntrepreneurPulse.com.au  Fact Sheet for Healthcare  Providers: IncredibleEmployment.be  This test is not yet approved or cleared by the [place] FDA and has been authorized for detection and/or diagnosis of SARS-CoV-2 by FDA under an Emergency Use Authorization (EUA). This EUA will remain in effect (meaning this test can be  used) for the duration of the COVID-19 declaration under Section 564(b)(1) of the Act, 21 U.S.C. section 360bbb-3(b)(1), unless the authorization is terminated or revoked.  Performed at [facility], [address] [place], [place] [address]     Time coordinating discharge:   SIGNED:  Irwin Brakeman, MD  [facility] 12/03/2021, 11:22 AM How to contact the [facility] Attending or Consulting provider [place] or covering provider during after hours [place], for this patient?  Check the care team in [facility] and look for a) attending/consulting [facility] provider listed and b) the [facility] team listed Log into [email or web address] and use [facility] universal password to access. If you do not have the password, please contact the hospital operator. Locate the [facility] provider you are looking for under [facility] and page to a number that you can be directly reached. If you still have difficulty reaching the provider, please page the [facility] (Director on Call) for the Hospitalists listed on amion for assistance.

## 2021-12-17 ENCOUNTER — Inpatient Hospital Stay (HOSPITAL_COMMUNITY)
Admission: EM | Admit: 2021-12-17 | Discharge: 2021-12-19 | DRG: 280 | Disposition: A | Discharge status: Hospice | Attending: Family Medicine | Admitting: Family Medicine

## 2021-12-17 ENCOUNTER — Emergency Department (HOSPITAL_COMMUNITY)

## 2021-12-17 ENCOUNTER — Other Ambulatory Visit: Payer: Self-pay

## 2021-12-17 ENCOUNTER — Encounter (HOSPITAL_COMMUNITY): Payer: Self-pay

## 2021-12-17 DIAGNOSIS — I214 Non-ST elevation (NSTEMI) myocardial infarction: Principal | ICD-10-CM

## 2021-12-17 DIAGNOSIS — Z66 Do not resuscitate: Secondary | ICD-10-CM | POA: Diagnosis not present

## 2021-12-17 DIAGNOSIS — M109 Gout, unspecified: Secondary | ICD-10-CM | POA: Diagnosis present

## 2021-12-17 DIAGNOSIS — R338 Other retention of urine: Secondary | ICD-10-CM | POA: Diagnosis present

## 2021-12-17 DIAGNOSIS — N401 Enlarged prostate with lower urinary tract symptoms: Secondary | ICD-10-CM | POA: Diagnosis present

## 2021-12-17 DIAGNOSIS — R102 Pelvic and perineal pain: Secondary | ICD-10-CM

## 2021-12-17 DIAGNOSIS — I480 Paroxysmal atrial fibrillation: Secondary | ICD-10-CM | POA: Diagnosis present

## 2021-12-17 DIAGNOSIS — I10 Essential (primary) hypertension: Secondary | ICD-10-CM | POA: Diagnosis present

## 2021-12-17 DIAGNOSIS — Z6820 Body mass index (BMI) 20.0-20.9, adult: Secondary | ICD-10-CM

## 2021-12-17 DIAGNOSIS — D696 Thrombocytopenia, unspecified: Secondary | ICD-10-CM | POA: Diagnosis present

## 2021-12-17 DIAGNOSIS — Z85038 Personal history of other malignant neoplasm of large intestine: Secondary | ICD-10-CM

## 2021-12-17 DIAGNOSIS — Z515 Encounter for palliative care: Secondary | ICD-10-CM

## 2021-12-17 DIAGNOSIS — Z20822 Contact with and (suspected) exposure to covid-19: Secondary | ICD-10-CM | POA: Diagnosis present

## 2021-12-17 DIAGNOSIS — Z96641 Presence of right artificial hip joint: Secondary | ICD-10-CM | POA: Diagnosis present

## 2021-12-17 DIAGNOSIS — E039 Hypothyroidism, unspecified: Secondary | ICD-10-CM | POA: Diagnosis present

## 2021-12-17 DIAGNOSIS — R52 Pain, unspecified: Secondary | ICD-10-CM | POA: Diagnosis present

## 2021-12-17 DIAGNOSIS — I130 Hypertensive heart and chronic kidney disease with heart failure and stage 1 through stage 4 chronic kidney disease, or unspecified chronic kidney disease: Secondary | ICD-10-CM | POA: Diagnosis present

## 2021-12-17 DIAGNOSIS — N184 Chronic kidney disease, stage 4 (severe): Secondary | ICD-10-CM | POA: Diagnosis present

## 2021-12-17 DIAGNOSIS — R079 Chest pain, unspecified: Secondary | ICD-10-CM | POA: Diagnosis not present

## 2021-12-17 DIAGNOSIS — G8929 Other chronic pain: Secondary | ICD-10-CM | POA: Diagnosis present

## 2021-12-17 DIAGNOSIS — D6959 Other secondary thrombocytopenia: Secondary | ICD-10-CM | POA: Diagnosis present

## 2021-12-17 DIAGNOSIS — E43 Unspecified severe protein-calorie malnutrition: Secondary | ICD-10-CM | POA: Diagnosis present

## 2021-12-17 DIAGNOSIS — I5032 Chronic diastolic (congestive) heart failure: Secondary | ICD-10-CM | POA: Diagnosis present

## 2021-12-17 DIAGNOSIS — Z9181 History of falling: Secondary | ICD-10-CM

## 2021-12-17 DIAGNOSIS — E872 Acidosis, unspecified: Secondary | ICD-10-CM | POA: Diagnosis present

## 2021-12-17 LAB — URINALYSIS, ROUTINE W REFLEX MICROSCOPIC
Bilirubin Urine: NEGATIVE
Glucose, UA: NEGATIVE mg/dL
Hgb urine dipstick: NEGATIVE
Ketones, ur: NEGATIVE mg/dL
Leukocytes,Ua: NEGATIVE
Nitrite: NEGATIVE
Protein, ur: NEGATIVE mg/dL
Specific Gravity, Urine: 1.012 (ref 1.005–1.030)
pH: 6 (ref 5.0–8.0)

## 2021-12-17 LAB — COMPREHENSIVE METABOLIC PANEL
ALT: 16 U/L (ref 0–44)
AST: 28 U/L (ref 15–41)
Albumin: 3.8 g/dL (ref 3.5–5.0)
Alkaline Phosphatase: 109 U/L (ref 38–126)
Anion gap: 16 — ABNORMAL HIGH (ref 5–15)
BUN: 73 mg/dL — ABNORMAL HIGH (ref 8–23)
CO2: 22 mmol/L (ref 22–32)
Calcium: 8.8 mg/dL — ABNORMAL LOW (ref 8.9–10.3)
Chloride: 101 mmol/L (ref 98–111)
Creatinine, Ser: 2.23 mg/dL — ABNORMAL HIGH (ref 0.61–1.24)
GFR, Estimated: 26 mL/min — ABNORMAL LOW (ref >60)
Glucose, Bld: 142 mg/dL — ABNORMAL HIGH (ref 70–99)
Potassium: 4.3 mmol/L (ref 3.5–5.1)
Sodium: 139 mmol/L (ref 135–145)
Total Bilirubin: 1.1 mg/dL (ref 0.3–1.2)
Total Protein: 6.6 g/dL (ref 6.5–8.1)

## 2021-12-17 LAB — CBC WITH DIFFERENTIAL/PLATELET
Abs Immature Granulocytes: 0.04 10*3/uL (ref 0.00–0.07)
Basophils Absolute: 0 10*3/uL (ref 0.0–0.1)
Basophils Relative: 1 %
Eosinophils Absolute: 0.1 10*3/uL (ref 0.0–0.5)
Eosinophils Relative: 1 %
HCT: 34.7 % — ABNORMAL LOW (ref 39.0–52.0)
Hemoglobin: 10.7 g/dL — ABNORMAL LOW (ref 13.0–17.0)
Immature Granulocytes: 1 %
Lymphocytes Relative: 12 %
Lymphs Abs: 0.9 10*3/uL (ref 0.7–4.0)
MCH: 28.1 pg (ref 26.0–34.0)
MCHC: 30.8 g/dL (ref 30.0–36.0)
MCV: 91.1 fL (ref 80.0–100.0)
Monocytes Absolute: 0.6 10*3/uL (ref 0.1–1.0)
Monocytes Relative: 9 %
Neutro Abs: 5.6 10*3/uL (ref 1.7–7.7)
Neutrophils Relative %: 76 %
Platelets: 145 10*3/uL — ABNORMAL LOW (ref 150–400)
RBC: 3.81 MIL/uL — ABNORMAL LOW (ref 4.22–5.81)
RDW: 15.6 % — ABNORMAL HIGH (ref 11.5–15.5)
WBC: 7.3 10*3/uL (ref 4.0–10.5)
nRBC: 0 % (ref 0.0–0.2)

## 2021-12-17 LAB — RESP PANEL BY RT-PCR (FLU A&B, COVID) ARPGX2
Influenza A by PCR: NEGATIVE
Influenza B by PCR: NEGATIVE
SARS Coronavirus 2 by RT PCR: NEGATIVE

## 2021-12-17 LAB — LACTIC ACID, PLASMA
Lactic Acid, Venous: 2.6 mmol/L (ref 0.5–1.9)
Lactic Acid, Venous: 1.7 mmol/L (ref 0.5–1.9)

## 2021-12-17 LAB — TROPONIN I (HIGH SENSITIVITY)
Troponin I (High Sensitivity): 160 ng/L (ref <18)
Troponin I (High Sensitivity): 808 ng/L (ref <18)
Troponin I (High Sensitivity): 8040 ng/L (ref <18)

## 2021-12-17 LAB — HEPARIN LEVEL (UNFRACTIONATED): Heparin Unfractionated: 0.18 IU/mL — ABNORMAL LOW (ref 0.30–0.70)

## 2021-12-17 LAB — URIC ACID: Uric Acid, Serum: 8.9 mg/dL — ABNORMAL HIGH (ref 3.7–8.6)

## 2021-12-17 MED ORDER — SENNOSIDES-DOCUSATE SODIUM 8.6-50 MG PO TABS: 1.0000 | ORAL_TABLET | Freq: Two times a day (BID) | ORAL | Status: DC | PRN

## 2021-12-17 MED ORDER — SODIUM CHLORIDE 0.9 % IV BOLUS
1000.0000 mL | Freq: Once | INTRAVENOUS | Status: AC
  Administered 2021-12-17: 1000 mL via INTRAVENOUS

## 2021-12-17 MED ORDER — BISACODYL 10 MG RE SUPP: 10.0000 mg | Freq: Every day | RECTAL | Status: DC | PRN

## 2021-12-17 MED ORDER — METOPROLOL TARTRATE 25 MG PO TABS
12.5000 mg | ORAL_TABLET | Freq: Two times a day (BID) | ORAL | Status: DC
  Administered 2021-12-17: 12.5 mg via ORAL
  Filled 2021-12-17 (×2): qty 1

## 2021-12-17 MED ORDER — ACETAMINOPHEN 325 MG PO TABS: 650.0000 mg | ORAL_TABLET | Freq: Four times a day (QID) | ORAL | Status: DC | PRN

## 2021-12-17 MED ORDER — ONDANSETRON HCL 4 MG PO TABS: 4.0000 mg | ORAL_TABLET | Freq: Four times a day (QID) | ORAL | Status: DC | PRN

## 2021-12-17 MED ORDER — FINASTERIDE 5 MG PO TABS
5.0000 mg | ORAL_TABLET | Freq: Every day | ORAL | Status: DC
Start: 2021-12-17 — End: 2021-12-19
  Administered 2021-12-17 – 2021-12-18 (×2): 5 mg via ORAL
  Filled 2021-12-17 (×2): qty 1

## 2021-12-17 MED ORDER — MELATONIN 3 MG PO TABS
9.0000 mg | ORAL_TABLET | Freq: Every day | ORAL | Status: DC
  Administered 2021-12-17 – 2021-12-18 (×2): 9 mg via ORAL
  Filled 2021-12-17 (×2): qty 3

## 2021-12-17 MED ORDER — NITROGLYCERIN 0.4 MG SL SUBL: 0.4000 mg | SUBLINGUAL_TABLET | SUBLINGUAL | Status: DC | PRN

## 2021-12-17 MED ORDER — ACETAMINOPHEN 650 MG RE SUPP
650.0000 mg | Freq: Four times a day (QID) | RECTAL | Status: DC | PRN
Start: 2021-12-17 — End: 2021-12-19

## 2021-12-17 MED ORDER — ASPIRIN EC 81 MG PO TBEC
81.0000 mg | DELAYED_RELEASE_TABLET | Freq: Every day | ORAL | Status: DC
  Administered 2021-12-18: 81 mg via ORAL
  Filled 2021-12-17 (×2): qty 1

## 2021-12-17 MED ORDER — HYDROMORPHONE HCL 1 MG/ML IJ SOLN
0.5000 mg | INTRAMUSCULAR | Status: DC | PRN
  Administered 2021-12-17 – 2021-12-18 (×2): 0.5 mg via INTRAVENOUS
  Filled 2021-12-17 (×2): qty 0.5

## 2021-12-17 MED ORDER — FUROSEMIDE 40 MG PO TABS: 40.0000 mg | ORAL_TABLET | Freq: Every day | ORAL | Status: DC

## 2021-12-17 MED ORDER — HEPARIN (PORCINE) 25000 UT/250ML-% IV SOLN
1200.0000 [IU]/h | INTRAVENOUS | Status: AC
  Administered 2021-12-17: 850 [IU]/h via INTRAVENOUS
  Administered 2021-12-18: 1100 [IU]/h via INTRAVENOUS
  Filled 2021-12-17 (×2): qty 250

## 2021-12-17 MED ORDER — LORAZEPAM 2 MG/ML IJ SOLN
0.2500 mg | Freq: Once | INTRAMUSCULAR | Status: DC
  Filled 2021-12-17: qty 1

## 2021-12-17 MED ORDER — HEPARIN BOLUS VIA INFUSION
4000.0000 [IU] | Freq: Once | INTRAVENOUS | Status: AC
  Administered 2021-12-17: 4000 [IU] via INTRAVENOUS

## 2021-12-17 MED ORDER — LEVOTHYROXINE SODIUM 50 MCG PO TABS
50.0000 ug | ORAL_TABLET | Freq: Every day | ORAL | Status: DC
  Administered 2021-12-18 – 2021-12-19 (×2): 50 ug via ORAL
  Filled 2021-12-17 (×2): qty 1

## 2021-12-17 MED ORDER — SODIUM CHLORIDE 0.9 % IV BOLUS
500.0000 mL | Freq: Once | INTRAVENOUS | Status: AC
  Administered 2021-12-17: 500 mL via INTRAVENOUS

## 2021-12-17 MED ORDER — ONDANSETRON HCL 4 MG/2ML IJ SOLN: 4.0000 mg | Freq: Four times a day (QID) | INTRAMUSCULAR | Status: DC | PRN

## 2021-12-17 MED ORDER — ALLOPURINOL 100 MG PO TABS
50.0000 mg | ORAL_TABLET | Freq: Every day | ORAL | Status: DC
  Administered 2021-12-18: 50 mg via ORAL
  Filled 2021-12-17: qty 1

## 2021-12-17 MED ORDER — FUROSEMIDE 20 MG PO TABS
10.0000 mg | ORAL_TABLET | Freq: Every day | ORAL | Status: DC
  Administered 2021-12-18: 10 mg via ORAL
  Filled 2021-12-17: qty 1

## 2021-12-17 MED ORDER — TAMSULOSIN HCL 0.4 MG PO CAPS
0.8000 mg | ORAL_CAPSULE | Freq: Every day | ORAL | Status: DC
  Administered 2021-12-17 – 2021-12-18 (×2): 0.8 mg via ORAL
  Filled 2021-12-17 (×2): qty 2

## 2021-12-17 NOTE — ED Notes (Signed)
Date and time results received: 12/17/21 1100 ? ? ?Test: Troponin ?Critical Value: 160 ? ?Name of Provider Notified: Tammy PA ? ?Orders Received? Or Actions Taken?: notified ?

## 2021-12-17 NOTE — Assessment & Plan Note (Addendum)
Hospice

## 2021-12-17 NOTE — Assessment & Plan Note (Addendum)
Stable

## 2021-12-17 NOTE — Assessment & Plan Note (Addendum)
Has been soft likely due to acute MI. ?

## 2021-12-17 NOTE — ED Notes (Signed)
2 lpm nasal cannula applied for comfort. Discoloration noted to pt ears and forehead.  ?

## 2021-12-17 NOTE — Assessment & Plan Note (Addendum)
No acute exacerbation, he is on low dose allopurinol.  ?

## 2021-12-17 NOTE — Assessment & Plan Note (Addendum)
Pt had a large MI.  Symptoms resolved, NTG as needed. Continue low dose metoprolol and aspirin daily.  ?

## 2021-12-17 NOTE — Assessment & Plan Note (Addendum)
Dr. [name] consulted in the ED and recommended IV heparin for 24 hours but nothing further to offer from a cardiac standpoint.  Given his new echo findings of large MI Dr. [name] recommended hospice care and patient will go home with hospice 12/19/2021 ? ?

## 2021-12-17 NOTE — ED Notes (Signed)
Pt c/o hand pain and grabbing his groin and penis c/o pain. Daughter states pt was c/o chest pain at home. Bruising notes to groin area and lower abd. Daughter states pt is palliative not hospice.  ?

## 2021-12-17 NOTE — Progress Notes (Signed)
ANTICOAGULATION CONSULT NOTE - Follow Up Consult ? ?Pharmacy Consult for heparin ?Indication: chest pain/ACS ? ?No Known Allergies ? ?Patient Measurements: ?Height: '5\' 11"'$  (180.3 cm) ?Weight: 68 kg (149 lb 14.6 oz) ?IBW/kg (Calculated) : 75.3 ?Heparin Dosing Weight: 68 kg ? ?Vital Signs: ?Temp: 97.9 ?F (36.6 ?C) (03/16 2219) ?Temp Source: Oral (03/16 2219) ?BP: 100/73 (03/16 2219) ?Pulse Rate: 78 (03/16 2219) ? ?Labs: ?Recent Labs  ?  12/17/21 ?1014 12/17/21 ?1212 12/17/21 ?1504 12/17/21 ?2211  ?HGB 10.7*  --   --   --   ?HCT 34.7*  --   --   --   ?PLT 145*  --   --   --   ?HEPARINUNFRC  --   --   --  0.18*  ?CREATININE 2.23*  --   --   --   ?TROPONINIHS 160* 808* 8,040*  --   ? ? ?Estimated Creatinine Clearance: 17.4 mL/min (A) (by C-G formula based on SCr of 2.23 mg/dL (H)). ? ? ?Assessment: ?88 yo male history of colon cacner, AFib/aflutter not on anticoag from primary cards note after convos with family, CKD IV presents with unclear history of body aches vs chest pain. He is under palliative care at home but not hospice. Trop 160-->808, ekg without acute changes. He is not a cath candidate. Not on oral anticoagulants. Pharmacy asked to start heparin. ? ?Heparin level returned subtherapeutic at 0.18 on 850 units/hr. Per RN no issues with heparin infusion, no s/sx bleeding. ? ?Goal of Therapy:  ?Heparin level 0.3-0.7 units/ml ?Monitor platelets by anticoagulation protocol: Yes ?  ?Plan:  ?Give 2000 units bolus x 1 ?Increase heparin infusion to 1100 units/hr ?Check anti-Xa level in 8 hours and daily while on heparin ?Continue to monitor H&H and platelets ? ?Carma Lair, PharmD Candidate [phone] ?12/17/2021,11:40 PM ? ? ?

## 2021-12-17 NOTE — Assessment & Plan Note (Addendum)
Pt is discharging home with hospice.  ?

## 2021-12-17 NOTE — ED Notes (Signed)
Pt can have water per Attending Dr.  ?

## 2021-12-17 NOTE — H&P (Signed)
History and Physical  [facility]  Gould Bessent IHK:[id number] DOB: [birth year] DOA: 12/17/2021  PCP: Kirstie Peri, MD  Patient coming from: Home Level of care: Telemetry  I have personally briefly reviewed patient's old medical records in [facility]  Chief Complaint: Chest pain  HPI: Nathan Kim is a 88 year old male with stage IV CKD, colon cancer status post resection, BPH, chronic urinary retention, diastolic congestive heart failure, paroxysmal atrial fibrillation and hypothyroidism recently diagnosed with gout was discharged 2 weeks ago from the hospital for an acute gout attack.  He he lives with wife and daughter checks on her frequently and reportedly wife reported to daughter that patient complained of shortness of breath and chest pain and grabbing at the left side of his chest earlier this morning.  He is currently on palliative care services.  Later he complained of hand pain groin pain and generalized whole body pain.  He apparently had a fall at home but this is not been confirmed.  It was not witnessed.  He had a work-up in the ED with no findings of traumatic injury.  His initial troponin was mildly elevated at 160 however when rechecked couple hours later it had right risen to 800.  Patient says he is feeling a lot better after pain medication given in the ED.  He was also placed on supplemental oxygen.  The ED contacted cardiology Dr. Wyline Mood who said that patient could benefit from 48 hours of IV heparin but is not a candidate for catheterization or more aggressive treatments given advanced age and comorbidities.  Patient was started on IV heparin in the ED and admission was requested.  Review of Systems: Review of Systems  Unable to perform ROS: Dementia    Past Medical History:  Diagnosis Date   Arthritis    BPH (benign prostatic hyperplasia)    Cancer (HCC)    colon adenocarcinoma   CKD (chronic kidney disease) stage 4, GFR 15-29 ml/min (HCC)    CKD  (chronic kidney disease), stage IV (HCC) 06/05/2021   Essential hypertension    Family history of adverse reaction to anesthesia    Hypothyroidism 06/06/2021   Paroxysmal atrial fibrillation (HCC)    Phlebitis 10/2016    Past Surgical History:  Procedure Laterality Date   BACK SURGERY     carpel tunnel     EYE SURGERY     HERNIA REPAIR     LAPAROSCOPY N/A 08/05/2021   Procedure: LAPAROSCOPIC ASSISTED RIGHT COLON RESECTION;  Surgeon: Harriette Bouillon, MD;  Location: [facility] OR;  Service: General;  Laterality: N/A;   LAPAROTOMY N/A 08/05/2021   Procedure: EXPLORATORY LAPAROTOMY;  Surgeon: Harriette Bouillon, MD;  Location: [facility] OR;  Service: General;  Laterality: N/A;   LUMBAR LAMINECTOMY/DECOMPRESSION MICRODISCECTOMY N/A 02/27/2016   Procedure: LUMBAR LAMINECTOMY/DECOMPRESSION MICRODISCECTOMY 3 LEVELS;  Surgeon: Barnett Abu, MD;  Location: [facility] NEURO ORS;  Service: Neurosurgery;  Laterality: N/A;  L2-3 L3-4 L4-5 Laminectomy   TOTAL HIP ARTHROPLASTY Right 05/24/2017   Procedure: RIGHT TOTAL HIP ARTHROPLASTY ANTERIOR APPROACH;  Surgeon: Durene Romans, MD;  Location: [facility] ORS;  Service: Orthopedics;  Laterality: Right;  70 mins     reports that he quit smoking about 42 years ago. His smoking use included cigarettes. He started smoking about 92 years ago. He has a 25.00 pack-year smoking history. He has never used smokeless tobacco. He reports that he does not drink alcohol and does not use drugs.  No Known Allergies  History reviewed. No pertinent family history.  Prior to Admission medications   Medication Sig Start Date End Date Taking? Authorizing Provider  allopurinol (ZYLOPRIM) 100 MG tablet Take 0.5 tablets (50 mg total) by mouth daily. 12/04/21  Yes Christina Gintz L, MD  aspirin EC 81 MG tablet Take 81 mg by mouth every other day.    Yes [provider]  finasteride (PROSCAR) 5 MG tablet Take 1 tablet by mouth daily. 08/20/21  Yes [provider]  furosemide (LASIX) 20 MG  tablet Take 1 tablet (20 mg total) by mouth daily. Patient taking differently: Take 40 mg by mouth 2 (two) times daily. 12/04/21  Yes Estrella Alcaraz L, MD  levothyroxine (SYNTHROID) 50 MCG tablet Take 50 mcg by mouth daily before breakfast.   Yes [provider]  Melatonin 10 MG TABS Take 10 mg by mouth at bedtime. 10/08/21  Yes [provider]  metoprolol tartrate (LOPRESSOR) 25 MG tablet TAKE ONE-HALF TABLET BY MOUTH TWICE DAILY. GENERIC EQUIVALENT FOR LOPRESSOR Patient taking differently: Take 12.5 mg by mouth 2 (two) times daily. 06/10/21  Yes Jonelle Sidle, MD  potassium chloride (KLOR-CON) 10 MEQ tablet Take 1 tablet (10 mEq total) by mouth every other day. 12/04/21  Yes Ulas Zuercher L, MD  tamsulosin (FLOMAX) 0.4 MG CAPS capsule Take 0.8 mg by mouth daily.   Yes [provider]  triamcinolone cream (KENALOG) 0.1 % Apply 1 application topically 2 (two) times daily as needed (itching). 04/22/21  Yes [provider]  UNABLE TO FIND Take 1 tablet by mouth every other day. Med Name: Asencion Islam WITH IRON   Yes [provider]  senna-docusate (SENOKOT-S) 8.6-50 MG tablet Take 1 tablet by mouth 2 (two) times daily between meals as needed for mild constipation. Patient not taking: Reported on 12/17/2021 08/12/21   Almon Hercules, MD    Physical Exam: Vitals:   12/17/21 1300 12/17/21 1330 12/17/21 1400 12/17/21 1430  BP: 97/62 94/63 97/64  (!) 98/58  Pulse: 69 73 70 68  Resp: 13 16 11 12   Temp:      TempSrc:      SpO2: 96% 100% 95% 94%  Weight:      Height:        Constitutional: Frail elderly male, somnolent but arousable, NAD, calm, comfortable.  He is cooperative. Eyes: PERRL, lids and conjunctivae normal ENMT: Mucous membranes are dry and pale.  Posterior pharynx clear of any exudate or lesions.  Neck: normal, supple, no masses, no thyromegaly Respiratory: clear to auscultation bilaterally, no wheezing, no crackles. Normal respiratory  effort. No accessory muscle use.  Cardiovascular: normal s1, s2 sounds, no murmurs / rubs / gallops. No extremity edema. 2+ pedal pulses. No carotid bruits.  Abdomen: no tenderness, no masses palpated. No hepatosplenomegaly. Bowel sounds positive.  Musculoskeletal: no clubbing / cyanosis. No joint deformity upper and lower extremities. Good ROM, no contractures. Normal muscle tone.  Skin: Blotches noted on the anterior chest wall, no ulcers. No induration Neurologic: CN 2-12 grossly intact. Sensation intact, DTR normal. Strength 5/5 in all 4.  Psychiatric: Poor judgment and insight.  He does seem to have mild form of dementia.  Alert and oriented x 3. Normal mood.   Labs on Admission: I have personally reviewed following labs and imaging studies  CBC: Recent Labs  Lab 12/17/21 1014  WBC 7.3  NEUTROABS 5.6  HGB 10.7*  HCT 34.7*  MCV 91.1  PLT 145*   Basic Metabolic Panel: Recent Labs  Lab 12/17/21 1014  NA 139  K  4.3  CL 101  CO2 22  GLUCOSE 142*  BUN 73*  CREATININE 2.23*  CALCIUM 8.8*   GFR: Estimated Creatinine Clearance: 17.4 mL/min (A) (by C-G formula based on SCr of 2.23 mg/dL (H)). Liver Function Tests: Recent Labs  Lab 12/17/21 1014  AST 28  ALT 16  ALKPHOS 109  BILITOT 1.1  PROT 6.6  ALBUMIN 3.8   No results for input(s): LIPASE, AMYLASE in the last 168 hours. No results for input(s): AMMONIA in the last 168 hours. Coagulation Profile: No results for input(s): INR, PROTIME in the last 168 hours. Cardiac Enzymes: No results for input(s): CKTOTAL, CKMB, CKMBINDEX, TROPONINI in the last 168 hours. BNP (last 3 results) No results for input(s): PROBNP in the last 8760 hours. HbA1C: No results for input(s): HGBA1C in the last 72 hours. CBG: No results for input(s): GLUCAP in the last 168 hours. Lipid Profile: No results for input(s): CHOL, HDL, LDLCALC, TRIG, CHOLHDL, LDLDIRECT in the last 72 hours. Thyroid Function Tests: No results for input(s): TSH,  T4TOTAL, FREET4, T3FREE, THYROIDAB in the last 72 hours. Anemia Panel: No results for input(s): VITAMINB12, FOLATE, FERRITIN, TIBC, IRON, RETICCTPCT in the last 72 hours. Urine analysis:    Component Value Date/Time   COLORURINE YELLOW 12/17/2021 0956   APPEARANCEUR CLEAR 12/17/2021 0956   LABSPEC 1.012 12/17/2021 0956   PHURINE 6.0 12/17/2021 0956   GLUCOSEU NEGATIVE 12/17/2021 0956   HGBUR NEGATIVE 12/17/2021 0956   BILIRUBINUR NEGATIVE 12/17/2021 0956   KETONESUR NEGATIVE 12/17/2021 0956   PROTEINUR NEGATIVE 12/17/2021 0956   NITRITE NEGATIVE 12/17/2021 0956   LEUKOCYTESUR NEGATIVE 12/17/2021 0956    Radiological Exams on Admission: CT ABDOMEN PELVIS WO CONTRAST  Result Date: 12/17/2021 CLINICAL DATA:  Possible trauma. EXAM: CT ABDOMEN AND PELVIS WITHOUT CONTRAST TECHNIQUE: Multidetector CT imaging of the abdomen and pelvis was performed following the standard protocol without IV contrast. RADIATION DOSE REDUCTION: This exam was performed according to the departmental dose-optimization program which includes automated exposure control, adjustment of the mA and/or kV according to patient size and/or use of iterative reconstruction technique. COMPARISON:  CT abdomen and pelvis 08/02/2021 FINDINGS: Lower chest: New small to moderate right and trace left pleural effusions with dependent atelectasis bilaterally. Coronary atherosclerosis. Hepatobiliary: No focal liver abnormality is identified on this unenhanced study. The gallbladder is unremarkable. There is no significant biliary dilatation. Pancreas: Chronic calcifications greatest in the pancreatic head with atrophy consistent with chronic pancreatitis. No acute peripancreatic inflammation or fluid collection. Spleen: Small calcifications, otherwise unremarkable. Adrenals/Urinary Tract: Thickening of the left greater than right adrenal glands. Similar appearance of left renal cysts. No renal calculi or hydronephrosis. Underdistended bladder  with moderate circumferential wall thickening, partially obscured by streak artifact. Stomach/Bowel: The stomach is unremarkable. There is no evidence of bowel obstruction. Sequelae of interval bowel surgery are identified with a colonic anastomosis in the right mid abdomen. There is extensive left-sided colonic diverticulosis without definite evidence of acute diverticulitis. Vascular/Lymphatic: Abdominal aortic atherosclerosis without aneurysm. No enlarged lymph nodes. Reproductive: Mildly enlarged prostate. Other: Small volume ascites primarily in the right paracolic gutter. No pneumoperitoneum. Musculoskeletal: Right hip arthroplasty. Partial bilateral SI joint ankylosis. Lumbar levoscoliosis with diffuse disc and facet degeneration. Unchanged chronic L2 superior endplate compression fracture/Schmorl's node deformity. IMPRESSION: 1. New right larger than left pleural effusions and small volume ascites. 2. Underdistended bladder with wall thickening. Correlate for cystitis. 3. Chronic pancreatitis. 4. Colonic diverticulosis without evidence of acute diverticulitis. 5. Aortic Atherosclerosis (ICD10-I70.0). These results were called by  telephone at the time of interpretation on 12/17/2021 at 1:38 pm to provider [facility] TRIPLETT , who verbally acknowledged these results. Electronically Signed   By: Sebastian Ache M.D.   On: 12/17/2021 13:44   DG Pelvis 1-2 Views  Result Date: 12/17/2021 CLINICAL DATA:  pain EXAM: PELVIS - 1-2 VIEW COMPARISON:  CT 08/02/2021 FINDINGS: Prior right total hip arthroplasty. There is lucency across the left femoral head favored to represent overlying soft tissue-acetabulum interface. There is moderate left hip osteoarthritis. No definite acute fracture. IMPRESSION: No definite acute fracture on single frontal view of the pelvis. A CT has been ordered. Electronically Signed   By: Caprice Renshaw M.D.   On: 12/17/2021 11:05   CT Head Wo Contrast  Result Date: 12/17/2021 CLINICAL DATA:   Trauma. EXAM: CT HEAD WITHOUT CONTRAST CT CERVICAL SPINE WITHOUT CONTRAST TECHNIQUE: Multidetector CT imaging of the head and cervical spine was performed following the standard protocol without intravenous contrast. Multiplanar CT image reconstructions of the cervical spine were also generated. RADIATION DOSE REDUCTION: This exam was performed according to the departmental dose-optimization program which includes automated exposure control, adjustment of the mA and/or kV according to patient size and/or use of iterative reconstruction technique. COMPARISON:  None. FINDINGS: CT HEAD FINDINGS Brain: There is no evidence of an acute infarct, intracranial hemorrhage, mass, midline shift, or extra-axial fluid collection. Cerebral atrophy is within normal limits for age. A subcentimeter infarct inferiorly in the right cerebellar hemisphere is chronic in appearance. Patchy hypodensities in the cerebral white matter bilaterally are nonspecific but compatible with moderate chronic small vessel ischemic disease. Vascular: Calcified atherosclerosis at the skull base. No hyperdense vessel. Skull: No acute fracture or suspicious osseous lesion. Sinuses/Orbits: Paranasal sinuses and mastoid air cells are clear. Bilateral cataract extraction. Other: None. CT CERVICAL SPINE FINDINGS There is moderate motion artifact through the upper cervical spine. Alignment: Normal. Skull base and vertebrae: No acute fracture or suspicious osseous lesion is identified within limitations of motion artifact. Soft tissues and spinal canal: No prevertebral fluid or swelling. No visible canal hematoma. Disc levels: Solid C3-C5 ACDF. Interbody ankylosis at C5-6 and C6-7 as well. Upper chest: No apical lung consolidation or mass. Other: Mild calcific atherosclerosis at the carotid bifurcations. IMPRESSION: 1. No evidence of an acute intracranial abnormality. 2. Motion degraded cervical spine CT without an acute fracture identified. These results were  called by telephone at the time of interpretation on 12/17/2021 at 1:38 pm to provider [facility] TRIPLETT , who verbally acknowledged these results. Electronically Signed   By: Sebastian Ache M.D.   On: 12/17/2021 13:45   CT Cervical Spine Wo Contrast  Result Date: 12/17/2021 CLINICAL DATA:  Trauma. EXAM: CT HEAD WITHOUT CONTRAST CT CERVICAL SPINE WITHOUT CONTRAST TECHNIQUE: Multidetector CT imaging of the head and cervical spine was performed following the standard protocol without intravenous contrast. Multiplanar CT image reconstructions of the cervical spine were also generated. RADIATION DOSE REDUCTION: This exam was performed according to the departmental dose-optimization program which includes automated exposure control, adjustment of the mA and/or kV according to patient size and/or use of iterative reconstruction technique. COMPARISON:  None. FINDINGS: CT HEAD FINDINGS Brain: There is no evidence of an acute infarct, intracranial hemorrhage, mass, midline shift, or extra-axial fluid collection. Cerebral atrophy is within normal limits for age. A subcentimeter infarct inferiorly in the right cerebellar hemisphere is chronic in appearance. Patchy hypodensities in the cerebral white matter bilaterally are nonspecific but compatible with moderate chronic small vessel ischemic disease. Vascular: Calcified  atherosclerosis at the skull base. No hyperdense vessel. Skull: No acute fracture or suspicious osseous lesion. Sinuses/Orbits: Paranasal sinuses and mastoid air cells are clear. Bilateral cataract extraction. Other: None. CT CERVICAL SPINE FINDINGS There is moderate motion artifact through the upper cervical spine. Alignment: Normal. Skull base and vertebrae: No acute fracture or suspicious osseous lesion is identified within limitations of motion artifact. Soft tissues and spinal canal: No prevertebral fluid or swelling. No visible canal hematoma. Disc levels: Solid C3-C5 ACDF. Interbody ankylosis at C5-6 and  C6-7 as well. Upper chest: No apical lung consolidation or mass. Other: Mild calcific atherosclerosis at the carotid bifurcations. IMPRESSION: 1. No evidence of an acute intracranial abnormality. 2. Motion degraded cervical spine CT without an acute fracture identified. These results were called by telephone at the time of interpretation on 12/17/2021 at 1:38 pm to provider [facility] TRIPLETT , who verbally acknowledged these results. Electronically Signed   By: Sebastian Ache M.D.   On: 12/17/2021 13:45   DG Chest Portable 1 View  Result Date: 12/17/2021 CLINICAL DATA:  chest pain EXAM: PORTABLE CHEST 1 VIEW COMPARISON:  December 02, 2021. FINDINGS: Mild diffuse interstitial opacities, improved from the prior. No consolidation. No visible pleural effusions or pneumothorax. Mild enlargement the cardiac silhouette, similar. ACDF. Aortic atherosclerosis. IMPRESSION: 1. Mild diffuse interstitial opacities, improved from the prior. This may represent the sequela of recurrent bouts of CHF versus mild interstitial edema. 2. Similar mild cardiomegaly. 3.  Aortic atherosclerosis (ICD10-I70.0). Electronically Signed   By: Feliberto Harts M.D.   On: 12/17/2021 10:39    EKG: Independently reviewed. Atrial flutter   Assessment/Plan Principal Problem:   NSTEMI (non-ST elevated myocardial infarction) (HCC) Active Problems:   CKD (chronic kidney disease), stage IV (HCC)   Gout   Chest pain   PAF (paroxysmal atrial fibrillation) (HCC)   Essential hypertension   Chronic diastolic CHF (congestive heart failure) (HCC)   Thrombocytopenia (HCC)   Hypothyroidism   Protein-calorie malnutrition, severe   Generalized pain   DNR (do not resuscitate)   Assessment and Plan: * NSTEMI (non-ST elevated myocardial infarction) (HCC) Dr. Wyline Mood consulted in the ED and recommended IV heparin for 48 hours but nothing further to offer from a cardiac standpoint.  Nitroglycerin ordered as needed for chest pain symptoms.  Dilaudid ordered  for pain control.  Morphine not ordered in setting of CKD.  Chest pain As above, no aggressive interventions.  Gout He has been started on renally dosed allopurinol from last admission which she takes daily.  Uric acid level slowly coming down with treatment.  Reducing diuretic dose as I think that he has high uric acid levels are contributing to his chronic pain condition.  CKD (chronic kidney disease), stage IV (HCC) Renal function unchanged from recent tests.  Continue to monitor closely and renally adjust medications as appropriate.  Thrombocytopenia (HCC) Monitor closely in the setting of IV heparin infusion with serial CBC  testing.  Chronic diastolic CHF (congestive heart failure) (HCC) 2D echocardiogram ordered in setting of NSTEMI.  Temporarily reducing diuretic dose given poor oral intake and high uric acid levels.  Essential hypertension Has been soft to well controlled on current medications.  Continue to follow closely.  PAF (paroxysmal atrial fibrillation) (HCC) He has been rate controlled with oral metoprolol.  He is not anticoagulated given fall risk and after discussion with family and cardiology.  Protein-calorie malnutrition, severe He has had ongoing poor oral intake over the past several months.  I have asked for palliative  consultation for further goals of care discussion and ongoing to recommend that he receive hospice services at home.  Hypothyroidism Resume home thyroid supplement  DNR (do not resuscitate) He is on palliative services at home.  I have recommended that he begin home hospice services.  We will need to discuss further with family.  Palliative consultation requested.  Generalized pain I think a lot of this is secondary to hyperuricemia.  Continuing colchicine.  Treating for comfort.  Palliative consultation requested.  Spiritual consult requested.   DVT prophylaxis: IV heparin infusion   Code Status: DNR   Family Communication: daughter  updated bedside 3/16   Disposition Plan: TBD   Consults called: palliative care, spiritural care   Admission status: OBS   Level of care: Telemetry Standley Dakins MD [facility] How to contact the [facility] Attending or Consulting provider 7A - 7P or covering provider during after hours 7P -7A, for this patient?  Check the care team in [facility] and look for a) attending/consulting [facility] provider listed and b) the [facility] team listed Log into [email or web address] and use [place]'s universal password to access. If you do not have the password, please contact the hospital operator. Locate the [facility] provider you are looking for under [facility] and page to a number that you can be directly reached. If you still have difficulty reaching the provider, please page the [facility] (Director on Call) for the Hospitalists listed on amion for assistance.   If 7PM-7AM, please contact night-coverage [email or web address] Password [facility]  12/17/2021, 3:55 PM

## 2021-12-17 NOTE — Progress Notes (Signed)
Dr. Wynetta Emery aware of increase in troponin from 808 to 8040. No new orders at this time, will treat with heparin infusion. ?Deirdre Pippins, RN ? ?

## 2021-12-17 NOTE — Assessment & Plan Note (Addendum)
Home with hospice

## 2021-12-17 NOTE — Assessment & Plan Note (Addendum)
Renal function unchanged from recent tests.   ?Discharging home with hospice 12/19/2021 ? ?

## 2021-12-17 NOTE — Assessment & Plan Note (Addendum)
He has been rate controlled with oral metoprolol.  He is not anticoagulated given fall risk and after discussion with family and cardiology.  Given recent large MI will have him continue low dose metoprolol.  ?

## 2021-12-17 NOTE — Progress Notes (Signed)
Palliative: ?Thank you for this consult. Unfortunately due to high volume of consults there will be a delay in a Palliative Provider seeing this patient. ? ?Palliative Medicine will return to service on 12/21/2021 and will see patient at that time. ? ?No charge ?Quinn Axe, NP ?Palliative Medicine ?Please call Palliative Medicine team phone with any questions [phone]. ?For individual providers please see AMION. ?

## 2021-12-17 NOTE — ED Notes (Signed)
Pt gone to CT 

## 2021-12-17 NOTE — Assessment & Plan Note (Addendum)
He has lasix at home to take as needed for fluid edema in the setting of comfort measures.   ?

## 2021-12-17 NOTE — ED Triage Notes (Signed)
CP started at 0800 this morning.  ?

## 2021-12-17 NOTE — Progress Notes (Signed)
Patient discussed with ER staff. 88 yo male history of colon cacner, AFib/aflutter not on anticoag from primary cards note after convos with family, CKD IV presents with unclear history of body aches vs chest pain. He is under palliative care at home but not hospice. Trop 160-->808, ekg without acute changes. He is not a cath candidate, would be reasonable for 48 hour admission for heparin, nitrates and morphine prn for recurrent chest pains. Not much else to offer from cardiac standpoint, can call with questions,. Lactic acidosis and AKI on CKD per primray team.  ? ? ?Carlyle Dolly MD ?

## 2021-12-17 NOTE — ED Provider Notes (Signed)
?[facility] ?Provider Note ? ? ?CSN: [id number] ?Arrival date & time: 12/17/21  0915 ? ?  ? ?History ? ?Chief Complaint  ?Patient presents with  ? Chest Pain  ? ? ?Nathan Kim is a 88 y.o. male. ? ? ?Chest Pain ? ?  ? ?Nathan Kim is a 89 y.o. male who comes from home via EMS with past medical history of paroxysmal atrial fibrillation, CKD, BPH, hypertension and CHF who presents to the Emergency Department complaining of "pain all over."  Patient's daughter who is at bedside provides most of the history.  She states the pain she complained of pain of his chest this morning to his wife.  The wife contacted the daughter, when she arrived she states her father was holding his left chest complaining of pain.  On arrival here, patient is grabbing at his groin area stating that he cannot urinate and complains of pain to his genitals.  Daughter states he was seen by urologist on Monday and had a clean urinalysis at that time.  She states that he was voiding without difficulty yesterday.  She denies any fever or chills vomiting or decreased appetite. ?Per EMS, patient was orthostatic positive on scene and given 500 mL normal saline and given 1 full-strength aspirin ? ?Patient states that he fell out of a chair or bed this morning, but daughter is unsure states that he has dementia.  No fall was witnessed. ? ?Patient is currently under palliative care and is DNR ? ? ? ?Home Medications ?Prior to Admission medications   ?Medication Sig Start Date End Date Taking? Authorizing Provider  ?allopurinol (ZYLOPRIM) 100 MG tablet Take 0.5 tablets (50 mg total) by mouth daily. 12/04/21   Johnson, Clanford L, MD  ?aspirin EC 81 MG tablet Take 81 mg by mouth every other day.     [provider]  ?finasteride (PROSCAR) 5 MG tablet Take 1 tablet by mouth daily. 08/20/21   [provider]  ?furosemide (LASIX) 20 MG tablet Take 1 tablet (20 mg total) by mouth daily. 12/04/21   Johnson, Clanford L,  MD  ?levothyroxine (SYNTHROID) 50 MCG tablet Take 50 mcg by mouth daily before breakfast.    [provider]  ?Melatonin 10 MG TABS Take 10 mg by mouth at bedtime. 10/08/21   [provider]  ?metoprolol tartrate (LOPRESSOR) 25 MG tablet TAKE ONE-HALF TABLET BY MOUTH TWICE DAILY. GENERIC EQUIVALENT FOR LOPRESSOR ?Patient taking differently: Take 12.5 mg by mouth 2 (two) times daily. 06/10/21   Satira Sark, MD  ?potassium chloride (KLOR-CON) 10 MEQ tablet Take 1 tablet (10 mEq total) by mouth every other day. 12/04/21   Johnson, Clanford L, MD  ?senna-docusate (SENOKOT-S) 8.6-50 MG tablet Take 1 tablet by mouth 2 (two) times daily between meals as needed for mild constipation. 08/12/21   Mercy Riding, MD  ?tamsulosin (FLOMAX) 0.4 MG CAPS capsule Take 0.8 mg by mouth daily.    [provider]  ?triamcinolone cream (KENALOG) 0.1 % Apply 1 application topically 2 (two) times daily as needed (itching). 04/22/21   [provider]  ?UNABLE TO FIND Take 1 tablet by mouth every other day. Med Name: Tawnya Crook WITH IRON    [provider]  ?   ? ?Allergies    ?Patient has no known allergies.   ? ?Review of Systems   ?Review of Systems  ?Unable to perform ROS: Dementia  ? ?Physical Exam ?Updated Vital Signs ?BP 95/61   Pulse 72  Temp (!) 97.5 ?F (36.4 ?C) (Axillary)   Resp (!) 28   Ht '5\' 11"'$  (1.803 m)   Wt 68 kg   SpO2 90%   BMI 20.91 kg/m?  ?Physical Exam ?Vitals and nursing note reviewed.  ?Constitutional:   ?   Appearance: He is well-developed. He is not ill-appearing or toxic-appearing.  ?HENT:  ?   Head: Atraumatic.  ?Eyes:  ?   Extraocular Movements: Extraocular movements intact.  ?   Pupils: Pupils are equal, round, and reactive to light.  ?Cardiovascular:  ?   Rate and Rhythm: Normal rate and regular rhythm.  ?   Pulses: Normal pulses.  ?Pulmonary:  ?   Effort: Pulmonary effort is normal. No respiratory distress.  ?Chest:  ?   Chest wall: No tenderness.  ?Abdominal:   ?   General: Bowel sounds are normal.  ?   Palpations: Abdomen is soft.  ?   Tenderness: There is no abdominal tenderness.  ?   Comments: Abdomen appears mildly distended.  There is a small area of central ecchymosis to the suprapubic region.  ?Musculoskeletal:  ?   Cervical back: Normal range of motion. No tenderness.  ?   Right lower leg: No edema.  ?   Left lower leg: No edema.  ?   Comments: No abrasions or ecchymosis of the back legs or hips.  ?Skin: ?   General: Skin is warm.  ?   Capillary Refill: Capillary refill takes less than 2 seconds.  ?Neurological:  ?   General: No focal deficit present.  ?   Mental Status: He is alert.  ?   Sensory: No sensory deficit.  ?   Motor: No weakness.  ? ? ?ED Results / Procedures / Treatments   ?Labs ?(all labs ordered are listed, but only abnormal results are displayed) ?Labs Reviewed  ?CBC WITH DIFFERENTIAL/PLATELET - Abnormal; Notable for the following components:  ?    Result Value  ? RBC 3.81 (*)   ? Hemoglobin 10.7 (*)   ? HCT 34.7 (*)   ? RDW 15.6 (*)   ? Platelets 145 (*)   ? All other components within normal limits  ?COMPREHENSIVE METABOLIC PANEL - Abnormal; Notable for the following components:  ? Glucose, Bld 142 (*)   ? BUN 73 (*)   ? Creatinine, Ser 2.23 (*)   ? Calcium 8.8 (*)   ? GFR, Estimated 26 (*)   ? Anion gap 16 (*)   ? All other components within normal limits  ?LACTIC ACID, PLASMA - Abnormal; Notable for the following components:  ? Lactic Acid, Venous 2.6 (*)   ? All other components within normal limits  ?TROPONIN I (HIGH SENSITIVITY) - Abnormal; Notable for the following components:  ? Troponin I (High Sensitivity) 160 (*)   ? All other components within normal limits  ?TROPONIN I (HIGH SENSITIVITY) - Abnormal; Notable for the following components:  ? Troponin I (High Sensitivity) 808 (*)   ? All other components within normal limits  ?RESP PANEL BY RT-PCR (FLU A&B, COVID) [id number]  ?URINALYSIS, ROUTINE W REFLEX MICROSCOPIC  ?LACTIC ACID, PLASMA   ?HEPARIN LEVEL (UNFRACTIONATED)  ? ? ?EKG ?EKG Interpretation ? ?Date/Time:  Thursday December 17 2021 09:36:41 EDT ?Ventricular Rate:  73 ?PR Interval:    ?QRS Duration: 149 ?QT Interval:  449 ?QTC Calculation: 495 ?R Axis:   -75 ?Text Interpretation: Atrial flutter with predominant 4:1 AV block Ventricular premature complex Right bundle branch block Probable inferior infarct, age indeterminate  Anterior infarct, old Lateral leads are also involved Confirmed by Milton Ferguson [phone]) on 12/17/2021 2:26:34 PM ? ?Radiology ?CT ABDOMEN PELVIS WO CONTRAST ? ?Result Date: 12/17/2021 ?CLINICAL DATA:  Possible trauma. EXAM: CT ABDOMEN AND PELVIS WITHOUT CONTRAST TECHNIQUE: Multidetector CT imaging of the abdomen and pelvis was performed following the standard protocol without IV contrast. RADIATION DOSE REDUCTION: This exam was performed according to the departmental dose-optimization program which includes automated exposure control, adjustment of the mA and/or kV according to patient size and/or use of iterative reconstruction technique. COMPARISON:  CT abdomen and pelvis 08/02/2021 FINDINGS: Lower chest: New small to moderate right and trace left pleural effusions with dependent atelectasis bilaterally. Coronary atherosclerosis. Hepatobiliary: No focal liver abnormality is identified on this unenhanced study. The gallbladder is unremarkable. There is no significant biliary dilatation. Pancreas: Chronic calcifications greatest in the pancreatic head with atrophy consistent with chronic pancreatitis. No acute peripancreatic inflammation or fluid collection. Spleen: Small calcifications, otherwise unremarkable. Adrenals/Urinary Tract: Thickening of the left greater than right adrenal glands. Similar appearance of left renal cysts. No renal calculi or hydronephrosis. Underdistended bladder with moderate circumferential wall thickening, partially obscured by streak artifact. Stomach/Bowel: The stomach is unremarkable. There is  no evidence of bowel obstruction. Sequelae of interval bowel surgery are identified with a colonic anastomosis in the right mid abdomen. There is extensive left-sided colonic diverticulosis without definite ev

## 2021-12-17 NOTE — Progress Notes (Signed)
ANTICOAGULATION CONSULT NOTE - Initial Consult ? ?Pharmacy Consult for Heparin ?Indication: chest pain/ACS ? ?No Known Allergies ? ?Patient Measurements: ?Height: '5\' 11"'$  (180.3 cm) ?Weight: 68 kg (149 lb 14.6 oz) ?IBW/kg (Calculated) : 75.3 ?HEPARIN DW (KG): 68  ? ?Vital Signs: ?Temp: 97.5 ?F (36.4 ?C) (03/16 2703) ?Temp Source: Axillary (03/16 5009) ?BP: 97/64 (03/16 1400) ?Pulse Rate: 70 (03/16 1400) ? ?Labs: ?Recent Labs  ?  12/17/21 ?1014 12/17/21 ?1212  ?HGB 10.7*  --   ?HCT 34.7*  --   ?PLT 145*  --   ?CREATININE 2.23*  --   ?TROPONINIHS 160* 808*  ? ? ?Estimated Creatinine Clearance: 17.4 mL/min (A) (by C-G formula based on SCr of 2.23 mg/dL (H)). ? ? ?Medical History: ?Past Medical History:  ?Diagnosis Date  ? Arthritis   ? BPH (benign prostatic hyperplasia)   ? Cancer [facility])   ? colon adenocarcinoma  ? CKD (chronic kidney disease) stage 4, GFR 15-29 ml/min (HCC)   ? CKD (chronic kidney disease), stage IV ([place]) 06/05/2021  ? Essential hypertension   ? Family history of adverse reaction to anesthesia   ? Hypothyroidism 06/06/2021  ? Paroxysmal atrial fibrillation (HCC)   ? Phlebitis 10/2016  ? ? ?Medications:  ?See home meds ? ?Assessment: ?88 yo male history of colon cacner, AFib/aflutter not on anticoag from primary cards note after convos with family, CKD IV presents with unclear history of body aches vs chest pain. He is under palliative care at home but not hospice. Trop 160-->808, ekg without acute changes. He is not a cath candidate. Not on oral anticoagulants. Pharmacy asked to start heparin. ? ?Goal of Therapy:  ?Heparin level 0.3-0.7 units/ml ?Monitor platelets by anticoagulation protocol: Yes ?  ?Plan:  ?Give 4000 units bolus x 1 ?Start heparin infusion at 850 units/hr ?Check anti-Xa level in ~8 hours and daily while on heparin ?Continue to monitor H&H and platelets ? ?Isac Sarna, BS Pharm D, BCPS ?Clinical Pharmacist ?Pager [phone] ?12/17/2021,2:07 PM ? ? ?

## 2021-12-17 NOTE — ED Triage Notes (Addendum)
Patient currently on hospice with complaints of  chest pain, mid back pain, bilateral leg and feet pain. Orthostatic positive on scene and EMS gave 536m NS and patient took '324mg'$  aspirin PTA.  ?

## 2021-12-17 NOTE — Hospital Course (Addendum)
88 year old male with stage IV CKD, colon cancer status post resection, BPH, chronic urinary retention, diastolic congestive heart failure, paroxysmal atrial fibrillation and hypothyroidism recently diagnosed with gout was discharged 2 weeks ago from the hospital for an acute gout attack.  He he lives with wife and daughter checks on her frequently and reportedly wife reported to daughter that patient complained of shortness of breath and chest pain and grabbing at the left side of his chest earlier this morning.  He is currently on palliative care services.  Later he complained of hand pain groin pain and generalized whole body pain.  He apparently had a fall at home but this is not been confirmed.  It was not witnessed.  He had a work-up in the ED with no findings of traumatic injury.  His initial troponin was mildly elevated at 160 however when rechecked couple hours later it had right risen to 800.  Patient says he is feeling a lot better after pain medication given in the ED.  He was also placed on supplemental oxygen.  The ED contacted cardiology Dr. [name] who said that patient could benefit from 48 hours of IV heparin but is not a candidate for catheterization or more aggressive treatments given advanced age and comorbidities.  Patient was started on IV heparin in the ED and admission was requested. ? ?12/18/2021: Patient reports no further chest pain and no shortness of breath symptoms.  He remains on IV heparin infusion.  At bedtime troponin tests markedly elevated. ? ?12/19/2021:  Echo reveals EF 20% with diffuse regional wall abnormalities. Cardiologist recommending hospice care.  Arrangements made for patient to discharge home with hospice.  ?

## 2021-12-18 ENCOUNTER — Observation Stay (HOSPITAL_COMMUNITY)

## 2021-12-18 DIAGNOSIS — I214 Non-ST elevation (NSTEMI) myocardial infarction: Secondary | ICD-10-CM

## 2021-12-18 DIAGNOSIS — I5032 Chronic diastolic (congestive) heart failure: Secondary | ICD-10-CM

## 2021-12-18 DIAGNOSIS — Z96641 Presence of right artificial hip joint: Secondary | ICD-10-CM | POA: Diagnosis present

## 2021-12-18 DIAGNOSIS — Z85038 Personal history of other malignant neoplasm of large intestine: Secondary | ICD-10-CM | POA: Diagnosis not present

## 2021-12-18 DIAGNOSIS — E872 Acidosis, unspecified: Secondary | ICD-10-CM | POA: Diagnosis present

## 2021-12-18 DIAGNOSIS — G8929 Other chronic pain: Secondary | ICD-10-CM | POA: Diagnosis present

## 2021-12-18 DIAGNOSIS — I130 Hypertensive heart and chronic kidney disease with heart failure and stage 1 through stage 4 chronic kidney disease, or unspecified chronic kidney disease: Secondary | ICD-10-CM | POA: Diagnosis present

## 2021-12-18 DIAGNOSIS — Z515 Encounter for palliative care: Secondary | ICD-10-CM | POA: Diagnosis not present

## 2021-12-18 DIAGNOSIS — M109 Gout, unspecified: Secondary | ICD-10-CM | POA: Diagnosis present

## 2021-12-18 DIAGNOSIS — N184 Chronic kidney disease, stage 4 (severe): Secondary | ICD-10-CM | POA: Diagnosis present

## 2021-12-18 DIAGNOSIS — I480 Paroxysmal atrial fibrillation: Secondary | ICD-10-CM | POA: Diagnosis present

## 2021-12-18 DIAGNOSIS — R079 Chest pain, unspecified: Secondary | ICD-10-CM | POA: Diagnosis not present

## 2021-12-18 DIAGNOSIS — E039 Hypothyroidism, unspecified: Secondary | ICD-10-CM | POA: Diagnosis present

## 2021-12-18 DIAGNOSIS — Z20822 Contact with and (suspected) exposure to covid-19: Secondary | ICD-10-CM | POA: Diagnosis present

## 2021-12-18 DIAGNOSIS — R338 Other retention of urine: Secondary | ICD-10-CM | POA: Diagnosis present

## 2021-12-18 DIAGNOSIS — E43 Unspecified severe protein-calorie malnutrition: Secondary | ICD-10-CM | POA: Diagnosis present

## 2021-12-18 DIAGNOSIS — N401 Enlarged prostate with lower urinary tract symptoms: Secondary | ICD-10-CM | POA: Diagnosis present

## 2021-12-18 DIAGNOSIS — D6959 Other secondary thrombocytopenia: Secondary | ICD-10-CM | POA: Diagnosis present

## 2021-12-18 DIAGNOSIS — Z9181 History of falling: Secondary | ICD-10-CM | POA: Diagnosis not present

## 2021-12-18 DIAGNOSIS — Z6820 Body mass index (BMI) 20.0-20.9, adult: Secondary | ICD-10-CM | POA: Diagnosis not present

## 2021-12-18 DIAGNOSIS — Z66 Do not resuscitate: Secondary | ICD-10-CM | POA: Diagnosis present

## 2021-12-18 LAB — LIPID PANEL
Cholesterol: 143 mg/dL (ref 0–200)
HDL: 64 mg/dL (ref >40)
LDL Cholesterol: 73 mg/dL (ref 0–99)
Total CHOL/HDL Ratio: 2.2 RATIO
Triglycerides: 28 mg/dL (ref <150)
VLDL: 6 mg/dL (ref 0–40)

## 2021-12-18 LAB — ECHOCARDIOGRAM COMPLETE
AR max vel: 3.86 cm2
AV Area VTI: 4.19 cm2
AV Area mean vel: 4.45 cm2
AV Mean grad: 0.7 mmHg
AV Peak grad: 1.4 mmHg
Ao pk vel: 0.59 m/s
Area-P 1/2: 3.43 cm2
Calc EF: 31.3 %
Height: 71 in
MV VTI: 1.38 cm2
S' Lateral: 2.7 cm
Single Plane A2C EF: 26.7 %
Single Plane A4C EF: 37.1 %
Weight: 2398.6 oz

## 2021-12-18 LAB — CBC
HCT: 33.6 % — ABNORMAL LOW (ref 39.0–52.0)
Hemoglobin: 10 g/dL — ABNORMAL LOW (ref 13.0–17.0)
MCH: 27.5 pg (ref 26.0–34.0)
MCHC: 29.8 g/dL — ABNORMAL LOW (ref 30.0–36.0)
MCV: 92.3 fL (ref 80.0–100.0)
Platelets: 134 10*3/uL — ABNORMAL LOW (ref 150–400)
RBC: 3.64 MIL/uL — ABNORMAL LOW (ref 4.22–5.81)
RDW: 15.9 % — ABNORMAL HIGH (ref 11.5–15.5)
WBC: 7.9 10*3/uL (ref 4.0–10.5)
nRBC: 0 % (ref 0.0–0.2)

## 2021-12-18 LAB — HEPARIN LEVEL (UNFRACTIONATED)
Heparin Unfractionated: 0.47 IU/mL (ref 0.30–0.70)
Heparin Unfractionated: 0.28 IU/mL — ABNORMAL LOW (ref 0.30–0.70)

## 2021-12-18 LAB — BASIC METABOLIC PANEL
Anion gap: 11 (ref 5–15)
BUN: 78 mg/dL — ABNORMAL HIGH (ref 8–23)
CO2: 21 mmol/L — ABNORMAL LOW (ref 22–32)
Calcium: 8.4 mg/dL — ABNORMAL LOW (ref 8.9–10.3)
Chloride: 106 mmol/L (ref 98–111)
Creatinine, Ser: 2.38 mg/dL — ABNORMAL HIGH (ref 0.61–1.24)
GFR, Estimated: 24 mL/min — ABNORMAL LOW (ref >60)
Glucose, Bld: 139 mg/dL — ABNORMAL HIGH (ref 70–99)
Potassium: 4.5 mmol/L (ref 3.5–5.1)
Sodium: 138 mmol/L (ref 135–145)

## 2021-12-18 LAB — MAGNESIUM: Magnesium: 2.7 mg/dL — ABNORMAL HIGH (ref 1.7–2.4)

## 2021-12-18 LAB — TROPONIN I (HIGH SENSITIVITY): Troponin I (High Sensitivity): >24000 ng/L (ref <18)

## 2021-12-18 MED ORDER — HEPARIN BOLUS VIA INFUSION
2000.0000 [IU] | Freq: Once | INTRAVENOUS | Status: AC
  Administered 2021-12-18: 2000 [IU] via INTRAVENOUS
  Filled 2021-12-18: qty 2000

## 2021-12-18 MED ORDER — ROSUVASTATIN CALCIUM 10 MG PO TABS
5.0000 mg | ORAL_TABLET | Freq: Every evening | ORAL | Status: DC
  Administered 2021-12-18: 5 mg via ORAL
  Filled 2021-12-18: qty 1

## 2021-12-18 NOTE — Care Management Important Message (Signed)
Important Message ? ?Patient Details  ?Name: Nathan Kim ?MRN: [id number] ?Date of Birth: [birth year] ? ? ?Medicare Important Message Given:  N/A - LOS <3 / Initial given by admissions ? ? ? ? ?Tommy Medal ?12/18/2021, 3:41 PM ?

## 2021-12-18 NOTE — Progress Notes (Signed)
Date and time results received: 12/18/21 8:04 AM ? ?(use smartphrase ".now" to insert current time) ? ?Test: Troponin ?Critical Value: >24,000 ? ?Name of Provider Notified: Dr.Johnson ? ?Orders Received? Or Actions Taken?:  ?

## 2021-12-18 NOTE — Progress Notes (Signed)
Auscultated BS on patient at RN request.  Patient does appear to have a very slight wheeze.  Patient had just got Dilaudid dose and was sleeping soundly.  Spoke with RN to monitor wheeze and let us know if it gets any worse. ?

## 2021-12-18 NOTE — Progress Notes (Signed)
ANTICOAGULATION CONSULT NOTE - Follow Up Consult ? ?Pharmacy Consult for heparin ?Indication: chest pain/ACS ? ?No Known Allergies ? ?Patient Measurements: ?Height: '5\' 11"'$  (180.3 cm) ?Weight: 68 kg (149 lb 14.6 oz) ?IBW/kg (Calculated) : 75.3 ?Heparin Dosing Weight: 68 kg ? ?Vital Signs: ?Temp: 97.7 ?F (36.5 ?C) (03/17 3267) ?Temp Source: Oral (03/17 1245) ?BP: 97/66 (03/17 0512) ?Pulse Rate: 58 (03/17 0512) ? ?Labs: ?Recent Labs  ?  12/17/21 ?1014 12/17/21 ?1212 12/17/21 ?1504 12/17/21 ?2211 12/18/21 ?0439 12/18/21 ?0805  ?HGB 10.7*  --   --   --  10.0*  --   ?HCT 34.7*  --   --   --  33.6*  --   ?PLT 145*  --   --   --  134*  --   ?HEPARINUNFRC  --   --   --  0.18*  --  0.47  ?CREATININE 2.23*  --   --   --  2.38*  --   ?TROPONINIHS 160* 808* 8,040*  --  >24,000*  --   ? ? ? ?Estimated Creatinine Clearance: 16.3 mL/min (A) (by C-G formula based on SCr of 2.38 mg/dL (H)). ? ? ?Assessment: ?88 yo male history of colon cacner, AFib/aflutter not on anticoag from primary cards note after convos with family, CKD IV presents with unclear history of body aches vs chest pain. He is under palliative care at home but not hospice.  He is not a cath candidate. Not on oral anticoagulants. Pharmacy asked to start heparin. ? ?HL 0.47- therapeutic ?Trop 8,040 > 24000 ? ?Goal of Therapy:  ?Heparin level 0.3-0.7 units/ml ?Monitor platelets by anticoagulation protocol: Yes ?  ?Plan:  ?Continue heparin infusion at 1100 units/hr ?Check anti-Xa level in 8 hours and daily while on heparin ?Continue to monitor H&H and platelets ? ?Margot Ables, PharmD ?Clinical Pharmacist ?12/18/2021 8:48 AM ? ? ? ?

## 2021-12-18 NOTE — Progress Notes (Signed)
?  Echocardiogram ?2D Echocardiogram has been performed. ? ?Oneal Deputy Rhilyn Battle RDCS ?12/18/2021, 9:56 AM ?

## 2021-12-18 NOTE — Progress Notes (Signed)
Echo reviewed, LVEF 20-25% with multiple WMAs. Trop >24,000. 88 yo male on home palliative care presented with chest pain, was not a cath candidate given advanced age, poor functional status,  and multiple advanced comorbidities. Should strongly consider hospice care. For now would plan heparin total 24 hours, continue ASA, statin, low dose beta blocker. Soft bp's don't think he would tolerate additional HF medications, could consolidation lopressor to toprol pending bp's and HRs. No additional cardiology recs, continue to discuss goals of care with palliative.  ? ? ?Carlyle Dolly MD ?

## 2021-12-18 NOTE — Progress Notes (Signed)
?PROGRESS NOTE ? ? ?Nathan Kim  ZGY:[id number] DOB: January 16, 1920 DOA: 12/17/2021 ?PCP: [name], MD  ? ?Chief Complaint  ?Patient presents with  ? Chest Pain  ? ?Level of care: Telemetry ? ?Brief Admission History:  ?88 year old male with stage IV CKD, colon cancer status post resection, BPH, chronic urinary retention, diastolic congestive heart failure, paroxysmal atrial fibrillation and hypothyroidism recently diagnosed with gout was discharged 2 weeks ago from the hospital for an acute gout attack.  He he lives with wife and daughter checks on her frequently and reportedly wife reported to daughter that patient complained of shortness of breath and chest pain and grabbing at the left side of his chest earlier this morning.  He is currently on palliative care services.  Later he complained of hand pain groin pain and generalized whole body pain.  He apparently had a fall at home but this is not been confirmed.  It was not witnessed.  He had a work-up in the ED with no findings of traumatic injury.  His initial troponin was mildly elevated at 160 however when rechecked couple hours later it had right risen to 800.  Patient says he is feeling a lot better after pain medication given in the ED.  He was also placed on supplemental oxygen.  The ED contacted cardiology Dr. [name] who said that patient could benefit from 48 hours of IV heparin but is not a candidate for catheterization or more aggressive treatments given advanced age and comorbidities.  Patient was started on IV heparin in the ED and admission was requested. ? ?12/18/2021: Patient reports no further chest pain and no shortness of breath symptoms.  He remains on IV heparin infusion.  At bedtime troponin tests markedly elevated. ?  ?Assessment and Plan: ?* NSTEMI (non-ST elevated myocardial infarction) ([place]) ?Dr. [name] consulted in the ED and recommended IV heparin for 48 hours but nothing further to offer from a cardiac standpoint.  Nitroglycerin  ordered as needed for chest pain symptoms.  Dilaudid ordered for pain control.  Morphine not ordered in setting of CKD. ?-Currently no chest pain symptoms, continue IV heparin infusion dosed per PharmD ? ?Chest pain ?Symptoms resolved, NTG and IV pain management as needed. ? ?Gout ?He has been started on renally dosed allopurinol from last admission which she takes daily.  Uric acid level slowly coming down with treatment.  Reducing diuretic dose as I think that he has high uric acid levels are contributing to his chronic pain condition. ? ?CKD (chronic kidney disease), stage IV ([place]) ?Renal function unchanged from recent tests.  Continue to monitor closely and renally adjust medications as appropriate. ? ?Thrombocytopenia ([place]) ?Monitor closely in the setting of IV heparin infusion with serial CBC  Testing. ?Currently holding stable-continue to monitor while on IV heparin ? ?Generalized pain ?I think a lot of this is secondary to hyperuricemia.  Continuing colchicine.  Treating for comfort.  Palliative consultation requested.  Spiritual consult requested. ? ?Chronic diastolic CHF (congestive heart failure) ([place]) ?2D echocardiogram ordered in setting of NSTEMI.  Temporarily reducing diuretic dose given poor oral intake and high uric acid levels. ? ?Essential hypertension ?Has been soft to well controlled on current medications.  Continue to follow closely. ? ?PAF (paroxysmal atrial fibrillation) ([place]) ?He has been rate controlled with oral metoprolol.  He is not anticoagulated given fall risk and after discussion with family and cardiology. ? ?Protein-calorie malnutrition, severe ?He has had ongoing poor oral intake over the past several months.  I have asked  for palliative consultation for further goals of care discussion and ongoing to recommend that he receive hospice services at home. ? ?Hypothyroidism ?Resume home thyroid supplement ? ?DNR (do not resuscitate) ?He is on palliative services at home.  I have  recommended that he begin home hospice services.  We will need to discuss further with family.  Palliative consultation requested. ? ?DVT prophylaxis: IV heparin infusion ?Code Status: DNR ?Family Communication: Daughter updated at bedside 3/17 ?Disposition: Status is: Observation ?The patient remains OBS appropriate and will d/c before 2 midnights. ?  ?Consultants:  ?Phone cardiology ?Palliative care  ?Procedures:  ?N/a ?Antimicrobials:  ?N/a  ?Subjective: ?Patient denies having any chest pain or shortness of breath.  No other complaints.  Eating breakfast. ?Objective: ?Vitals:  ? 12/17/21 1808 12/17/21 2219 12/18/21 0214 12/18/21 0512  ?BP: 104/71 1'00/73 94/73 97/66 '$  ?Pulse: 81 78 63 (!) 58  ?Resp: '20 18 18 18  '$ ?Temp: (!) 97.5 ?F (36.4 ?C) 97.9 ?F (36.6 ?C) 99 ?F (37.2 ?C) 97.7 ?F (36.5 ?C)  ?TempSrc: Oral Oral  Oral  ?SpO2: 100% 96% 97% 96%  ?Weight:      ?Height:      ? ? ?Intake/Output Summary (Last 24 hours) at 12/18/2021 0958 ?Last data filed at 12/18/2021 0500 ?Gross per 24 hour  ?Intake 1.89 ml  ?Output 900 ml  ?Net -898.11 ml  ? ?Filed Weights  ? 12/17/21 0923  ?Weight: 68 kg  ? ?Examination: ? ?General exam: frail elderly male, awake, alert, cooperative, hard of hearing. Appears calm and comfortable  ?Respiratory system: Clear to auscultation. Respiratory effort normal. ?Cardiovascular system: normal S1 & S2 heard. No JVD, murmurs, rubs, gallops or clicks. Trace pedal edema. ?Gastrointestinal system: Abdomen is nondistended, soft and nontender. No organomegaly or masses felt. Normal bowel sounds heard. ?Central nervous system: Alert and oriented to person and place only. No focal neurological deficits. ?Extremities: Symmetric 5 x 5 power. ?Skin: No rashes, lesions or ulcers. ?Psychiatry: Judgement and insight appear UTD. Mood & affect appropriate.  ? ?Data Reviewed: I have personally reviewed following labs and imaging studies ? ?CBC: ?Recent Labs  ?Lab 12/17/21 ?1014 12/18/21 ?0439  ?WBC 7.3 7.9   ?NEUTROABS 5.6  --   ?HGB 10.7* 10.0*  ?HCT 34.7* 33.6*  ?MCV 91.1 92.3  ?PLT 145* 134*  ? ? ?Basic Metabolic Panel: ?Recent Labs  ?Lab 12/17/21 ?1014 12/18/21 ?0439  ?NA 139 138  ?K 4.3 4.5  ?CL 101 106  ?CO2 22 21*  ?GLUCOSE 142* 139*  ?BUN 73* 78*  ?CREATININE 2.23* 2.38*  ?CALCIUM 8.8* 8.4*  ?MG  --  2.7*  ? ? ?CBG: ?No results for input(s): GLUCAP in the last 168 hours. ? ?Recent Results (from the past 240 hour(s))  ?Resp Panel by RT-PCR (Flu A&B, Covid) Nasopharyngeal Swab     Status: None  ? Collection Time: 12/17/21  1:50 PM  ? Specimen: Nasopharyngeal Swab; Nasopharyngeal(NP) swabs in vial transport medium  ?Result Value Ref Range Status  ? SARS Coronavirus 2 by RT PCR NEGATIVE NEGATIVE Final  ?  Comment: (NOTE) ?SARS-CoV-2 target nucleic acids are NOT DETECTED. ? ?The SARS-CoV-2 RNA is generally detectable in upper respiratory ?specimens during the acute phase of infection. The lowest ?concentration of SARS-CoV-2 viral copies this assay can detect is ?138 copies/mL. A negative result does not preclude SARS-Cov-2 ?infection and should not be used as the sole basis for treatment or ?other patient management decisions. A negative result may occur with  ?improper specimen collection/handling, submission of  specimen other ?than nasopharyngeal swab, presence of viral mutation(s) within the ?areas targeted by this assay, and inadequate number of viral ?copies(<138 copies/mL). A negative result must be combined with ?clinical observations, patient history, and epidemiological ?information. The expected result is Negative. ? ?Fact Sheet for Patients:  ?EntrepreneurPulse.com.au ? ?Fact Sheet for Healthcare Providers:  ?IncredibleEmployment.be ? ?This test is no t yet approved or cleared by the [place] FDA and  ?has been authorized for detection and/or diagnosis of SARS-CoV-2 by ?FDA under an Emergency Use Authorization (EUA). This EUA will remain  ?in effect (meaning this  test can be used) for the duration of the ?COVID-19 declaration under Section 564(b)(1) of the Act, 21 ?U.S.C.section 360bbb-3(b)(1), unless the authorization is terminated  ?or revoked sooner.  ? ? ?  ? Influenza A by

## 2021-12-18 NOTE — TOC Initial Note (Signed)
Transition of Care (TOC) - Initial/Assessment Note  ? ? ?Patient Details  ?Name: Nathan Kim ?MRN: [id number] ?Date of Birth: 02/21/1920 ? ?Transition of Care (TOC) CM/SW Contact:    ?Boneta Lucks, RN ?Phone Number: ?12/18/2021, 2:08 PM ? ?Clinical Narrative:       Patient admitted with Nstemi, patient is active with [facility] outpatient palliative. MD consulted TOC to transition patient to home with hospice.  TOC spoke with [name]. They plan to take him home tomorrow and will need a hospital bed. TOC sent referral to [facility] and updated them with the need of a hospital bed. Updated MD to order meds from [facility]. TOC to follow for DC tomorrow.    ? ? ?Expected Discharge Plan: [place] ?Barriers to Discharge: Continued Medical Work up ? ? ?Patient Goals and CMS Choice ?Patient states their goals for this hospitalization and ongoing recovery are:: to go home. ?   ? ?Expected Discharge Plan and Services ?Expected Discharge Plan: [place] ?  ?     Activities of Daily Living ?Home Assistive Devices/Equipment: Cane (specify quad or straight), Eyeglasses, Oxygen, Shower chair with back, Walker (specify type), Wheelchair ?ADL Screening (condition at time of admission) ?Patient's cognitive ability adequate to safely complete daily activities?: No ?Is the patient deaf or have difficulty hearing?: Yes ?Does the patient have difficulty seeing, even when wearing glasses/contacts?: No ?Does the patient have difficulty concentrating, remembering, or making decisions?: Yes ?Patient able to express need for assistance with ADLs?: Yes ?Does the patient have difficulty dressing or bathing?: Yes ?Independently performs ADLs?: No ?Communication: Independent ?Dressing (OT): Needs assistance ?Is this a change from baseline?: Pre-admission baseline ?Grooming: Needs assistance ?Is this a change from baseline?: Pre-admission baseline ?Feeding: Independent ?Bathing: Needs assistance ?Is  this a change from baseline?: Pre-admission baseline ?Toileting: Needs assistance ?Is this a change from baseline?: Pre-admission baseline ?In/Out Bed: Needs assistance ?Is this a change from baseline?: Pre-admission baseline ?Walks in Home: Needs assistance ?Is this a change from baseline?: Pre-admission baseline ?Does the patient have difficulty walking or climbing stairs?: Yes ?Weakness of Legs: Both ?Weakness of Arms/Hands: None ? ? ?   ? ?Admission diagnosis:  NSTEMI (non-ST elevated myocardial infarction) ([place]) [I21.4] ?Pelvic pain [R10.2] ?Patient Active Problem List  ? Diagnosis Date Noted  ? NSTEMI (non-ST elevated myocardial infarction) ([place]) 12/17/2021  ? Chest pain 12/17/2021  ? Generalized pain 12/17/2021  ? DNR (do not resuscitate) 12/17/2021  ? Thrombocytopenia ([place]) 12/17/2021  ? Gout 12/03/2021  ? AKI (acute kidney injury) ([place]) 12/02/2021  ? Right shoulder pain 12/02/2021  ? Protein-calorie malnutrition, severe 08/05/2021  ? SBO (small bowel obstruction) ([place]) 08/02/2021  ? Chronic diastolic CHF (congestive heart failure) ([place]) 08/02/2021  ? Hypothyroidism 06/06/2021  ? Ileus ([place]) 06/05/2021  ? CKD (chronic kidney disease), stage IV ([place]) 06/05/2021  ? Normocytic anemia 06/05/2021  ? Elevated troponin 06/05/2021  ? S/P right THA, AA 05/24/2017  ? Lumbar stenosis with neurogenic claudication 02/27/2016  ? PAF (paroxysmal atrial fibrillation) ([place]) 02/25/2016  ? Essential hypertension 02/25/2016  ? Preoperative clearance 02/25/2016  ? PVC's (premature ventricular contractions) 02/25/2016  ? First degree heart block 02/25/2016  ? ?PCP:  [name], MD ?Pharmacy:   ?[facility] [place] ?[address] ?[address] ?[place] [address] ?Phone: [phone] Fax: [phone] ? ?[place] ?[place] [address] ?  Phone: [phone] Fax: [phone] ? ?[facility] [address] ?[place] [address] ?Phone: [phone] Fax: [phone] ? ?[place] ?[place] [address] ?Phone: [phone] Fax: [phone] ? ?Readmission Risk Interventions ?Readmission Risk Prevention Plan 12/18/2021  ?Transportation Screening Complete  ?PCP or Specialist Appt within 5-7 Days Complete  ?Home Care Screening Complete  ?Medication Review (RN CM) Complete  ?Some recent data might be hidden  ? ? ? ?

## 2021-12-18 NOTE — Plan of Care (Signed)
  Problem: Education: Goal: Knowledge of General Education information will improve Description Including pain rating scale, medication(s)/side effects and non-pharmacologic comfort measures Outcome: Progressing   Problem: Health Behavior/Discharge Planning: Goal: Ability to manage health-related needs will improve Outcome: Progressing   

## 2021-12-18 NOTE — Progress Notes (Signed)
ANTICOAGULATION CONSULT NOTE - Follow Up Consult ? ?Pharmacy Consult for heparin ?Indication: chest pain/ACS ? ?No Known Allergies ? ?Patient Measurements: ?Height: '5\' 11"'$  (180.3 cm) ?Weight: 68 kg (149 lb 14.6 oz) ?IBW/kg (Calculated) : 75.3 ?Heparin Dosing Weight: 68 kg ? ?Vital Signs: ?Temp: 98 ?F (36.7 ?C) (03/17 1341) ?Temp Source: Oral (03/17 1341) ?BP: 95/66 (03/17 1341) ?Pulse Rate: 65 (03/17 1341) ? ?Labs: ?Recent Labs  ?  12/17/21 ?1014 12/17/21 ?1212 12/17/21 ?1504 12/17/21 ?2211 12/18/21 ?0439 12/18/21 ?0805 12/18/21 ?1506  ?HGB 10.7*  --   --   --  10.0*  --   --   ?HCT 34.7*  --   --   --  33.6*  --   --   ?PLT 145*  --   --   --  134*  --   --   ?HEPARINUNFRC  --   --   --  0.18*  --  0.47 0.28*  ?CREATININE 2.23*  --   --   --  2.38*  --   --   ?TROPONINIHS 160* 808* 8,040*  --  >24,000*  --   --   ? ? ? ?Estimated Creatinine Clearance: 16.3 mL/min (A) (by C-G formula based on SCr of 2.38 mg/dL (H)). ? ? ?Assessment: ?88 yo male history of colon cacner, AFib/aflutter not on anticoag from primary cards note after convos with family, CKD IV presents with unclear history of body aches vs chest pain. He is under palliative care at home but not hospice.  He is not a cath candidate. Not on oral anticoagulants. Pharmacy asked to start heparin. ? ?HL 0.28- subtherapeutic ?Trop 8,040 > 24000 ? ?Goal of Therapy:  ?Heparin level 0.3-0.7 units/ml ?Monitor platelets by anticoagulation protocol: Yes ?  ?Plan:  ?Increase heparin infusion to 1200 units/hr ?Check anti-Xa level in 8 hours and daily while on heparin ?Continue to monitor H&H and platelets ? ?Margot Ables, PharmD ?Clinical Pharmacist ?12/18/2021 4:22 PM ? ? ? ?

## 2021-12-19 DIAGNOSIS — Z66 Do not resuscitate: Secondary | ICD-10-CM | POA: Diagnosis not present

## 2021-12-19 DIAGNOSIS — E43 Unspecified severe protein-calorie malnutrition: Secondary | ICD-10-CM

## 2021-12-19 DIAGNOSIS — I10 Essential (primary) hypertension: Secondary | ICD-10-CM

## 2021-12-19 DIAGNOSIS — N184 Chronic kidney disease, stage 4 (severe): Secondary | ICD-10-CM

## 2021-12-19 DIAGNOSIS — I214 Non-ST elevation (NSTEMI) myocardial infarction: Secondary | ICD-10-CM | POA: Diagnosis not present

## 2021-12-19 DIAGNOSIS — M1A2110 Drug-induced chronic gout, right shoulder, without tophus (tophi): Secondary | ICD-10-CM

## 2021-12-19 DIAGNOSIS — R079 Chest pain, unspecified: Secondary | ICD-10-CM

## 2021-12-19 LAB — CBC
HCT: 31.7 % — ABNORMAL LOW (ref 39.0–52.0)
Hemoglobin: 9.8 g/dL — ABNORMAL LOW (ref 13.0–17.0)
MCH: 28.9 pg (ref 26.0–34.0)
MCHC: 30.9 g/dL (ref 30.0–36.0)
MCV: 93.5 fL (ref 80.0–100.0)
Platelets: 120 10*3/uL — ABNORMAL LOW (ref 150–400)
RBC: 3.39 MIL/uL — ABNORMAL LOW (ref 4.22–5.81)
RDW: 16.4 % — ABNORMAL HIGH (ref 11.5–15.5)
WBC: 6.5 10*3/uL (ref 4.0–10.5)
nRBC: 0 % (ref 0.0–0.2)

## 2021-12-19 MED ORDER — ASPIRIN EC 81 MG PO TBEC: 81.0000 mg | DELAYED_RELEASE_TABLET | Freq: Every day | ORAL | 11 refills | Status: AC

## 2021-12-19 MED ORDER — TRAZODONE HCL 50 MG PO TABS: 25.0000 mg | ORAL_TABLET | Freq: Every evening | ORAL | 1 refills | Status: DC | PRN

## 2021-12-19 MED ORDER — TRAZODONE HCL 50 MG PO TABS: 25.0000 mg | ORAL_TABLET | Freq: Every evening | ORAL | 1 refills | Status: AC | PRN

## 2021-12-19 MED ORDER — NITROGLYCERIN 0.4 MG SL SUBL: 0.4000 mg | SUBLINGUAL_TABLET | SUBLINGUAL | 1 refills | Status: AC | PRN

## 2021-12-19 MED ORDER — NITROGLYCERIN 0.4 MG SL SUBL: 0.4000 mg | SUBLINGUAL_TABLET | SUBLINGUAL | 1 refills | Status: DC | PRN

## 2021-12-19 NOTE — Discharge Summary (Signed)
Physician Discharge Summary  ?Nathan Kim JQB:[id number] DOB: 1920/05/30 DOA: 12/17/2021 ? ?PCP: [name], MD ? ?Admit date: 12/17/2021 ?Discharge date: 12/19/2021 ? ?Admitted From:   Home  ?Disposition:  Home with Hospice  ? ?Discharge Condition: Hospice   ?CODE STATUS: DNR  ?Diet: comfort   ? ?Brief Hospitalization Summary: ?Please see all hospital notes, images, labs for full details of the hospitalization. ?88 year old male with stage IV CKD, colon cancer status post resection, BPH, chronic urinary retention, diastolic congestive heart failure, paroxysmal atrial fibrillation and hypothyroidism recently diagnosed with gout was discharged 2 weeks ago from the hospital for an acute gout attack.  He he lives with wife and daughter checks on her frequently and reportedly wife reported to daughter that patient complained of shortness of breath and chest pain and grabbing at the left side of his chest earlier this morning.  He is currently on palliative care services.  Later he complained of hand pain groin pain and generalized whole body pain.  He apparently had a fall at home but this is not been confirmed.  It was not witnessed.  He had a work-up in the ED with no findings of traumatic injury.  His initial troponin was mildly elevated at 160 however when rechecked couple hours later it had right risen to 800.  Patient says he is feeling a lot better after pain medication given in the ED.  He was also placed on supplemental oxygen.  The ED contacted cardiology Dr. [name] who said that patient could benefit from 48 hours of IV heparin but is not a candidate for catheterization or more aggressive treatments given advanced age and comorbidities.  Patient was started on IV heparin in the ED and admission was requested. ? ?12/18/2021: Patient reports no further chest pain and no shortness of breath symptoms.  He remains on IV heparin infusion.  At bedtime troponin tests markedly elevated. ? ?12/19/2021:  Echo reveals EF  20% with diffuse regional wall abnormalities. Cardiologist recommending hospice care.  Arrangements made for patient to discharge home with hospice.  ? ?HOSPITAL COURSE BY PROBLEM LIST ? ?Assessment and Plan: ?* NSTEMI (non-ST elevated myocardial infarction) ([place]) ?Dr. [name] consulted in the ED and recommended IV heparin for 24 hours but nothing further to offer from a cardiac standpoint.  Given his new echo findings of large MI Dr. [name] recommended hospice care and patient will go home with hospice 12/19/2021 ? ? ?Chest pain ?Pt had a large MI.  Symptoms resolved, NTG as needed. Continue low dose metoprolol and aspirin daily.  ? ?Gout ?No acute exacerbation, he is on low dose allopurinol.  ? ?CKD (chronic kidney disease), stage IV ([place]) ?Renal function unchanged from recent tests.   ?Discharging home with hospice 12/19/2021 ? ? ?Thrombocytopenia ([place]) ?Stable.  ? ?Generalized pain ?Home with hospice.  ? ?Chronic diastolic CHF (congestive heart failure) ([place]) ?He has lasix at home to take as needed for fluid edema in the setting of comfort measures.   ? ?Essential hypertension ?Has been soft likely due to acute MI. ? ?PAF (paroxysmal atrial fibrillation) ([place]) ?He has been rate controlled with oral metoprolol.  He is not anticoagulated given fall risk and after discussion with family and cardiology.  Given recent large MI will have him continue low dose metoprolol.  ? ?Protein-calorie malnutrition, severe ?Hospice  ? ?Hypothyroidism ?Stable.  ? ?DNR (do not resuscitate) ?Pt is discharging home with hospice.  ? ? ? ?Discharge Diagnoses:  ?Principal Problem: ?  NSTEMI (non-ST elevated myocardial  infarction) ([place]) ?Active Problems: ?  CKD (chronic kidney disease), stage IV ([place]) ?  Gout ?  Chest pain ?  PAF (paroxysmal atrial fibrillation) ([place]) ?  Essential hypertension ?  Chronic diastolic CHF (congestive heart failure) ([place]) ?  Generalized pain ?  Thrombocytopenia ([place]) ?  Hypothyroidism ?  Protein-calorie  malnutrition, severe ?  DNR (do not resuscitate) ? ? ?Discharge Instructions: ? ?Allergies as of 12/19/2021   ?No Known Allergies ?  ? ?  ?Medication List  ?  ? ?STOP taking these medications   ? ?potassium chloride 10 MEQ tablet ?Commonly known as: KLOR-CON ?  ?senna-docusate 8.6-50 MG tablet ?Commonly known as: Senokot-S ?  ?UNABLE TO FIND ?  ? ?  ? ?TAKE these medications   ? ?allopurinol 100 MG tablet ?Commonly known as: ZYLOPRIM ?Take 0.5 tablets (50 mg total) by mouth daily. ?  ?aspirin EC 81 MG tablet ?Take 1 tablet (81 mg total) by mouth daily. ?What changed: when to take this ?  ?finasteride 5 MG tablet ?Commonly known as: PROSCAR ?Take 1 tablet by mouth daily. ?  ?furosemide 20 MG tablet ?Commonly known as: LASIX ?Take 1 tablet (20 mg total) by mouth daily. ?What changed:  ?how much to take ?when to take this ?  ?levothyroxine 50 MCG tablet ?Commonly known as: SYNTHROID ?Take 50 mcg by mouth daily before breakfast. ?  ?Melatonin 10 MG Tabs ?Take 10 mg by mouth at bedtime. ?  ?metoprolol tartrate 25 MG tablet ?Commonly known as: LOPRESSOR ?TAKE ONE-HALF TABLET BY MOUTH TWICE DAILY. GENERIC EQUIVALENT FOR LOPRESSOR ?What changed:  ?how much to take ?how to take this ?when to take this ?additional instructions ?  ?nitroGLYCERIN 0.4 MG SL tablet ?Commonly known as: NITROSTAT ?Place 1 tablet (0.4 mg total) under the tongue every 5 (five) minutes as needed for chest pain. ?  ?tamsulosin 0.4 MG Caps capsule ?Commonly known as: FLOMAX ?Take 0.8 mg by mouth daily. ?  ?triamcinolone cream 0.1 % ?Commonly known as: KENALOG ?Apply 1 application topically 2 (two) times daily as needed (itching). ?  ? ?  ? ? ?No Known Allergies ?Allergies as of 12/19/2021   ?No Known Allergies ?  ? ?  ?Medication List  ?  ? ?STOP taking these medications   ? ?potassium chloride 10 MEQ tablet ?Commonly known as: KLOR-CON ?  ?senna-docusate 8.6-50 MG tablet ?Commonly known as: Senokot-S ?  ?UNABLE TO FIND ?  ? ?  ? ?TAKE these medications    ? ?allopurinol 100 MG tablet ?Commonly known as: ZYLOPRIM ?Take 0.5 tablets (50 mg total) by mouth daily. ?  ?aspirin EC 81 MG tablet ?Take 1 tablet (81 mg total) by mouth daily. ?What changed: when to take this ?  ?finasteride 5 MG tablet ?Commonly known as: PROSCAR ?Take 1 tablet by mouth daily. ?  ?furosemide 20 MG tablet ?Commonly known as: LASIX ?Take 1 tablet (20 mg total) by mouth daily. ?What changed:  ?how much to take ?when to take this ?  ?levothyroxine 50 MCG tablet ?Commonly known as: SYNTHROID ?Take 50 mcg by mouth daily before breakfast. ?  ?Melatonin 10 MG Tabs ?Take 10 mg by mouth at bedtime. ?  ?metoprolol tartrate 25 MG tablet ?Commonly known as: LOPRESSOR ?TAKE ONE-HALF TABLET BY MOUTH TWICE DAILY. GENERIC EQUIVALENT FOR LOPRESSOR ?What changed:  ?how much to take ?how to take this ?when to take this ?additional instructions ?  ?nitroGLYCERIN 0.4 MG SL tablet ?Commonly known as: NITROSTAT ?Place 1 tablet (0.4 mg total) under  the tongue every 5 (five) minutes as needed for chest pain. ?  ?tamsulosin 0.4 MG Caps capsule ?Commonly known as: FLOMAX ?Take 0.8 mg by mouth daily. ?  ?triamcinolone cream 0.1 % ?Commonly known as: KENALOG ?Apply 1 application topically 2 (two) times daily as needed (itching). ?  ? ?  ? ? ?Procedures/Studies: ?CT ABDOMEN PELVIS WO CONTRAST ? ?Result Date: 12/17/2021 ?CLINICAL DATA:  Possible trauma. EXAM: CT ABDOMEN AND PELVIS WITHOUT CONTRAST TECHNIQUE: Multidetector CT imaging of the abdomen and pelvis was performed following the standard protocol without IV contrast. RADIATION DOSE REDUCTION: This exam was performed according to the departmental dose-optimization program which includes automated exposure control, adjustment of the mA and/or kV according to patient size and/or use of iterative reconstruction technique. COMPARISON:  CT abdomen and pelvis 08/02/2021 FINDINGS: Lower chest: New small to moderate right and trace left pleural effusions with dependent  atelectasis bilaterally. Coronary atherosclerosis. Hepatobiliary: No focal liver abnormality is identified on this unenhanced study. The gallbladder is unremarkable. There is no significant biliary dilatation. [place]

## 2021-12-19 NOTE — TOC Transition Note (Signed)
Transition of Care (TOC) - CM/SW Discharge Note ? ? ?Patient Details  ?Name: Nathan Kim ?MRN: [id number] ?Date of Birth: 1919-11-01 ? ?Transition of Care (TOC) CM/SW Contact:  ?Boneta Lucks, RN ?Phone Number: ?12/19/2021, 10:49 AM ? ? ?Clinical Narrative:   Patient discharging home with daughter. Family will transport. TOC called Hospice with DC plan so they could set up time for RN to assess at home.  ? ?Final next level of care: [place] ?Barriers to Discharge: Barriers Resolved ? ? ?Patient Goals and CMS Choice ?Patient states their goals for this hospitalization and ongoing recovery are:: to go home. ?  ?  ? ?Discharge Placement ?   ?Patient and family notified of of transfer: 12/19/21 ? ?Discharge Plan and Services ?  ?  ?          Home with [place]  ? ?Readmission Risk Interventions ?Readmission Risk Prevention Plan 12/18/2021  ?Transportation Screening Complete  ?PCP or Specialist Appt within 5-7 Days Complete  ?Home Care Screening Complete  ?Medication Review (RN CM) Complete  ?Some recent data might be hidden  ? ? ? ? ? ?

## 2021-12-19 NOTE — Plan of Care (Signed)

## 2022-05-24 ENCOUNTER — Ambulatory Visit: Payer: Medicare Other | Admitting: Cardiology

## 2022-08-04 DEATH — deceased

## 2022-08-23 IMAGING — CT CT ABD-PELV W/O CM
2 of 4 series · 16 of 46 positions shown, 18 images · non-contrast
Comparison: CT abdomen and pelvis 08/02/2021

CLINICAL DATA: Possible trauma.



[Series 2: axial st · axial · 0.74mm/px · z∈[-722,-306]mm · 13 of 95 slices shown, 15 images]
[im 6/95  soft-tissue]
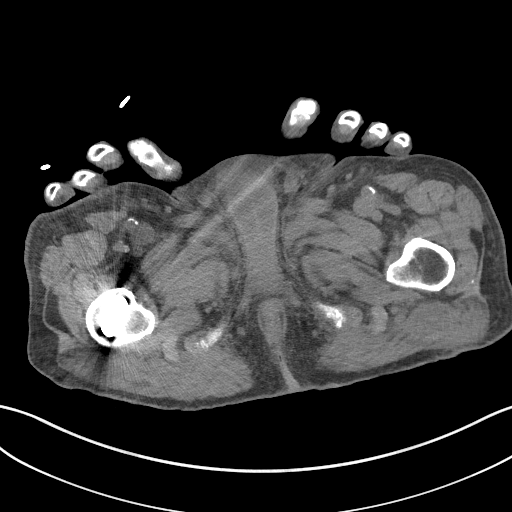
[im 6/95  bone]
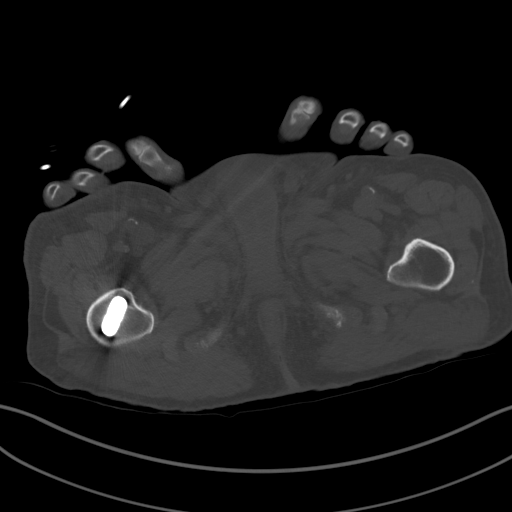
[im 12/95  soft-tissue]
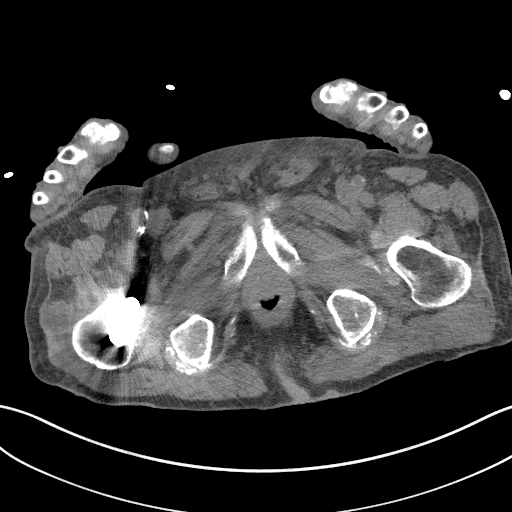
[im 18/95  soft-tissue]
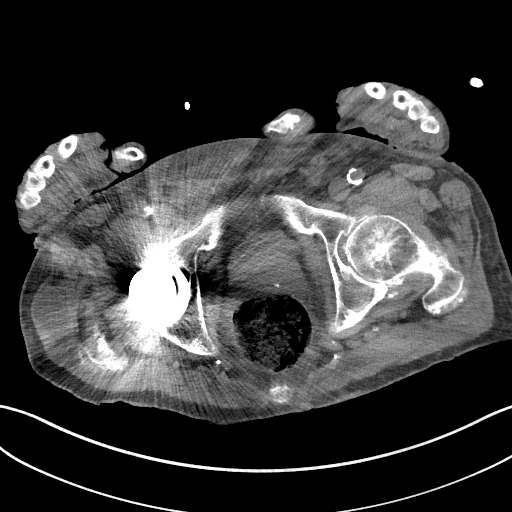
[im 30/95  soft-tissue]
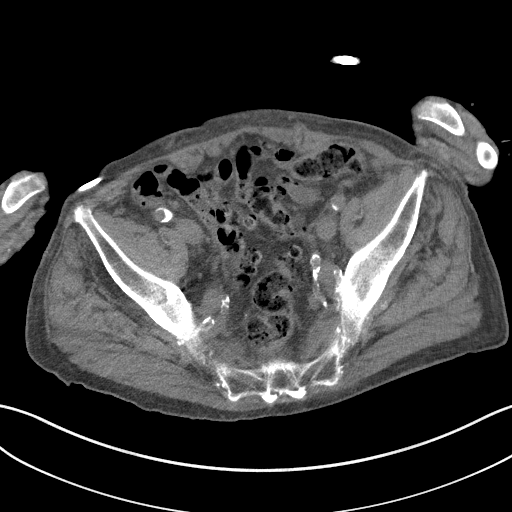
[im 36/95  soft-tissue]
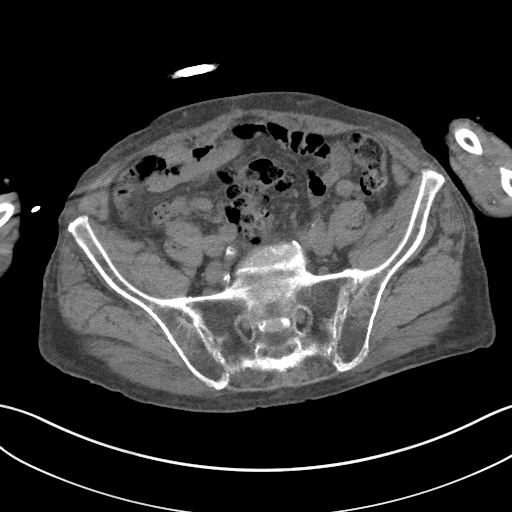
[im 42/95  soft-tissue]
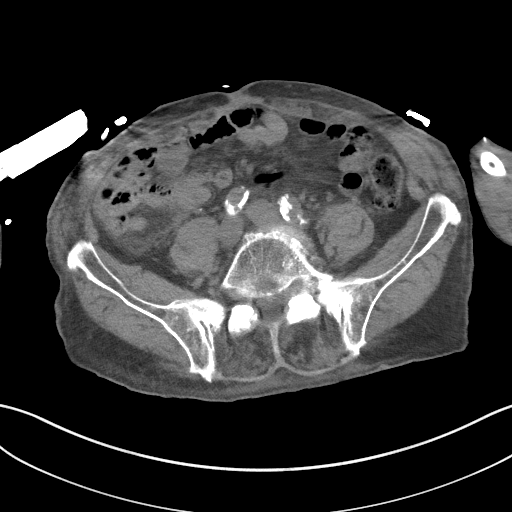
[im 48/95  soft-tissue]
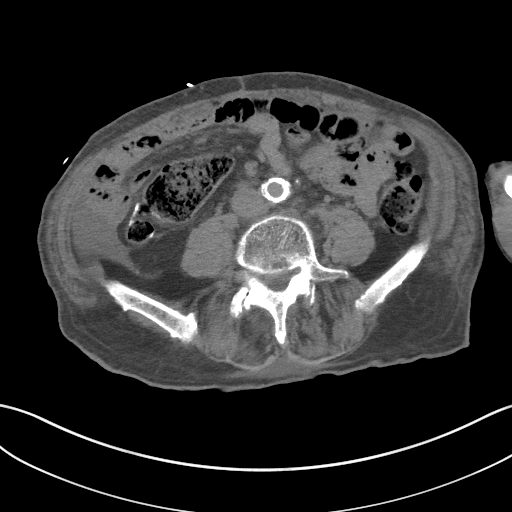
[im 53/95  soft-tissue]
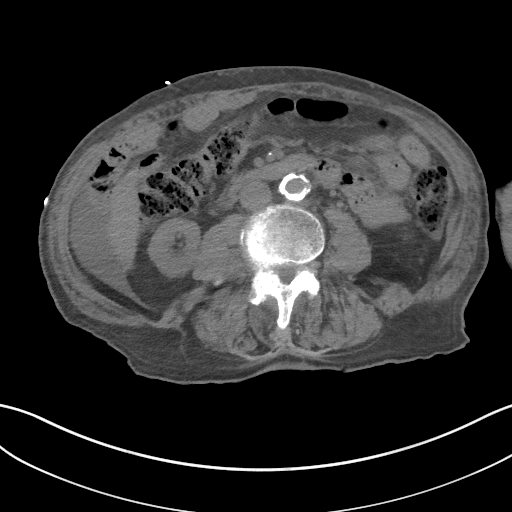
[im 59/95  soft-tissue]
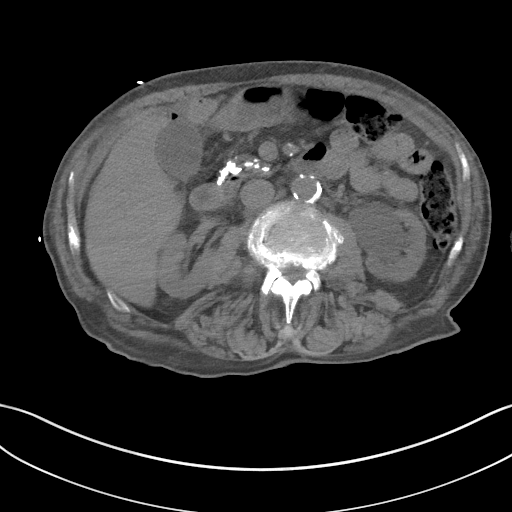
[im 59/95  bone]
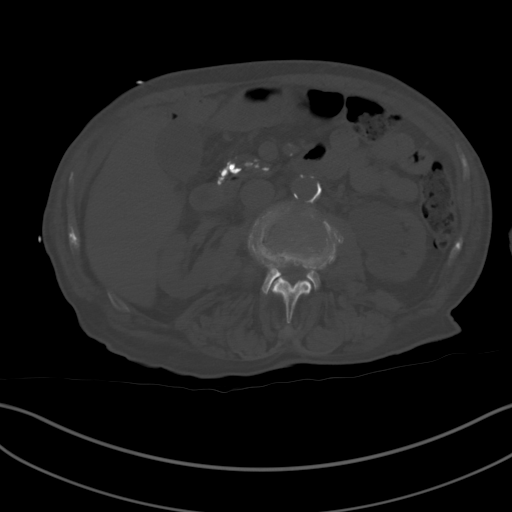
[im 65/95  soft-tissue]
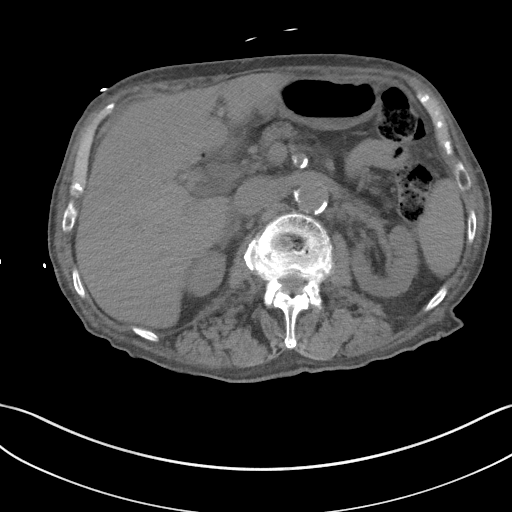
[im 77/95  soft-tissue]
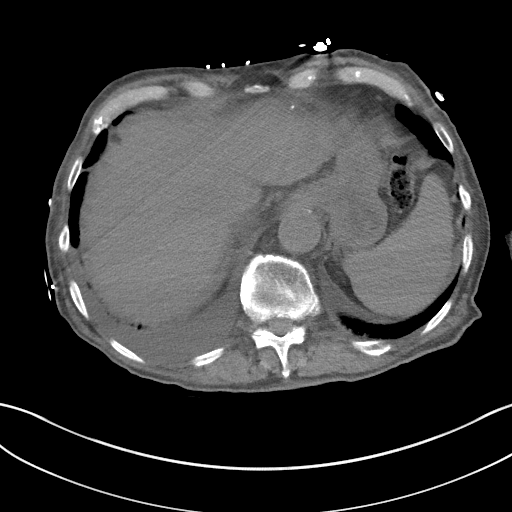
[im 83/95  soft-tissue]
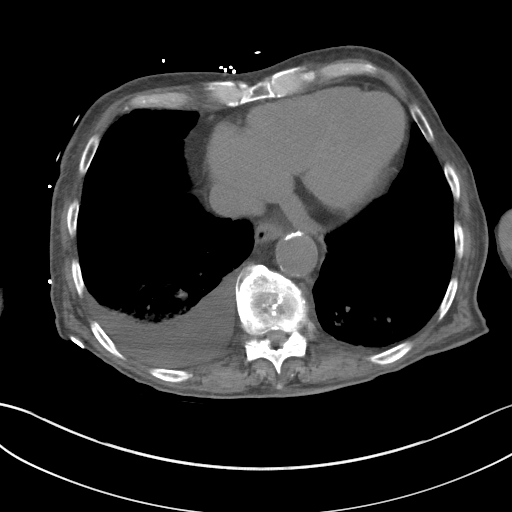
[im 89/95  soft-tissue]
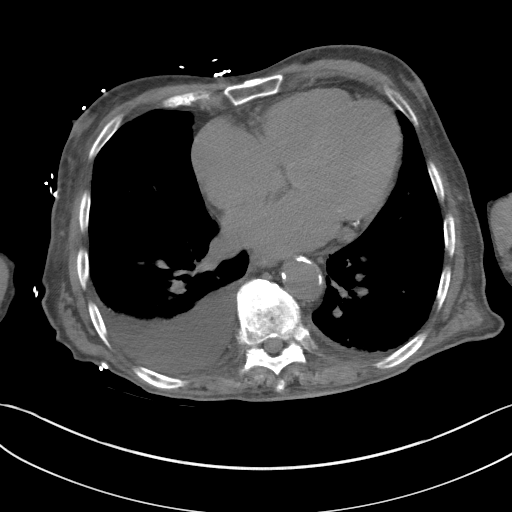

[Series 5: coronal st · coronal · 0.93mm/px · 3 of 140 slices shown]
[im 47/140  soft-tissue]
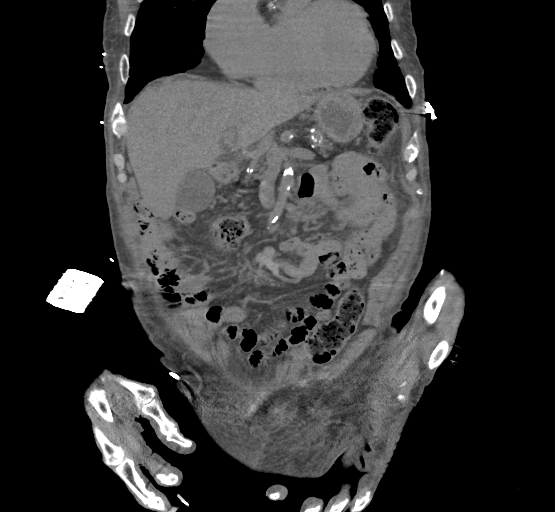
[im 62/140  soft-tissue]
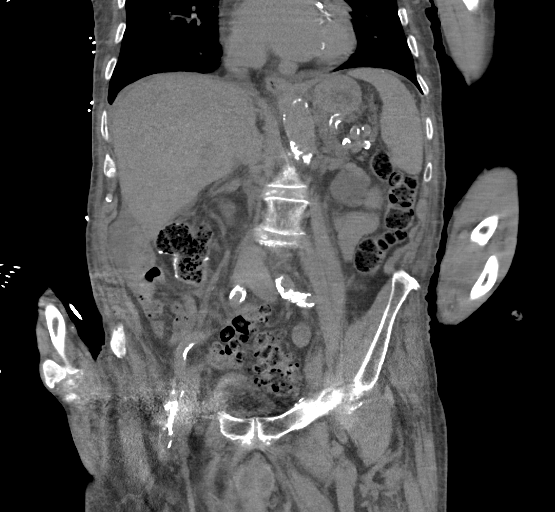
[im 78/140  soft-tissue]
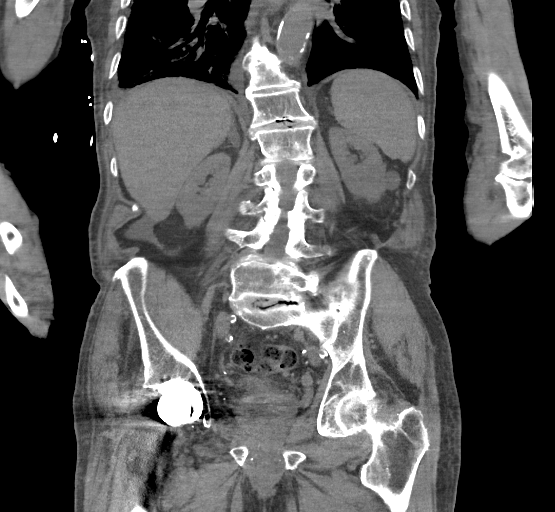

[16 of 46 positions shown; findings below may reference images not displayed]

FINDINGS: Lower chest: New small to moderate right and trace left pleural
effusions with dependent atelectasis bilaterally. Coronary
atherosclerosis.

Hepatobiliary: No focal liver abnormality is identified on this
unenhanced study. The gallbladder is unremarkable. There is no
significant biliary dilatation.

Pancreas: Chronic calcifications greatest in the pancreatic head
with atrophy consistent with chronic pancreatitis. No acute
peripancreatic inflammation or fluid collection.

Spleen: Small calcifications, otherwise unremarkable.

Adrenals/Urinary Tract: Thickening of the left greater than right
adrenal glands. Similar appearance of left renal cysts. No renal
calculi or hydronephrosis. Underdistended bladder with moderate
circumferential wall thickening, partially obscured by streak
artifact.

Stomach/Bowel: The stomach is unremarkable. There is no evidence of
bowel obstruction. Sequelae of interval bowel surgery are identified
with a colonic anastomosis in the right mid abdomen. There is
extensive left-sided colonic diverticulosis without definite
evidence of acute diverticulitis.

Vascular/Lymphatic: Abdominal aortic atherosclerosis without
aneurysm. No enlarged lymph nodes.

Reproductive: Mildly enlarged prostate.

Other: Small volume ascites primarily in the right paracolic gutter.
No pneumoperitoneum.

Musculoskeletal: Right hip arthroplasty. Partial bilateral SI joint
ankylosis. Lumbar levoscoliosis with diffuse disc and facet
degeneration. Unchanged chronic L2 superior endplate compression
fracture/Schmorl's node deformity.
IMPRESSION: 1. New right larger than left pleural effusions and small volume
ascites.
2. Underdistended bladder with wall thickening. Correlate for
cystitis.
3. Chronic pancreatitis.
4. Colonic diverticulosis without evidence of acute diverticulitis.
5. Aortic Atherosclerosis ([id number]).

These results were called by telephone at the time of interpretation
on 12/17/2021 at [DATE] to provider AKIG ONLARLA , who verbally
acknowledged these results.

## 2022-08-23 IMAGING — CT CT HEAD W/O CM
4 series · 15 of 47 positions shown, 17 images · non-contrast
Comparison: None.

CLINICAL DATA: Trauma.



[Series 2: head w o · axial · 0.42mm/px · z∈[-11,+109]mm · 7 of 34 slices shown, 9 images]
[im 5/34  brain]
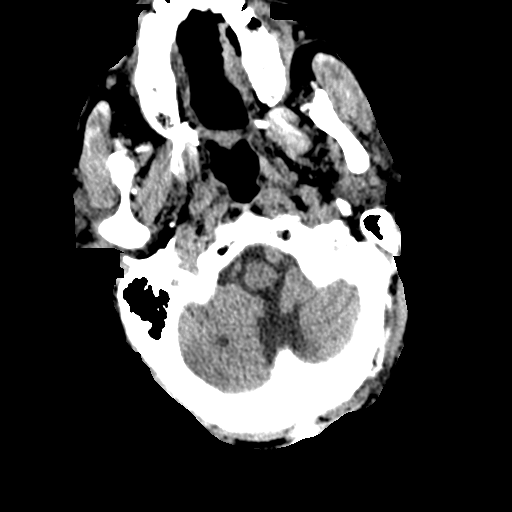
[im 5/34  bone]
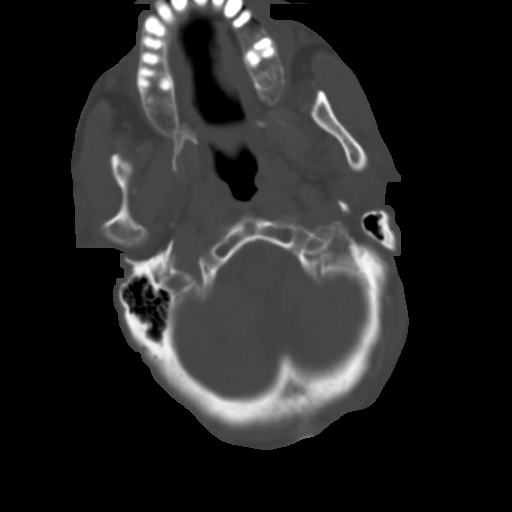
[im 9/34  brain]
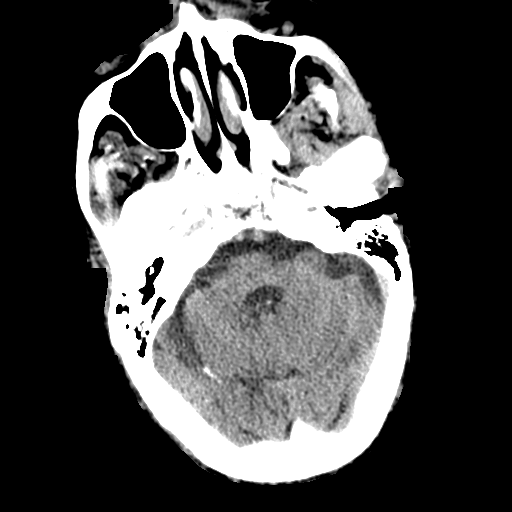
[im 13/34  brain]
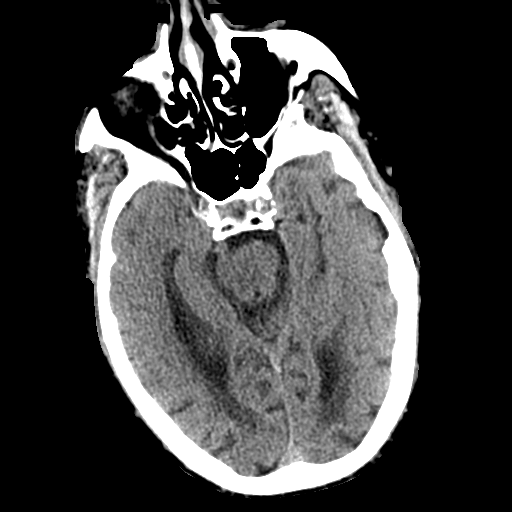
[im 17/34  brain]
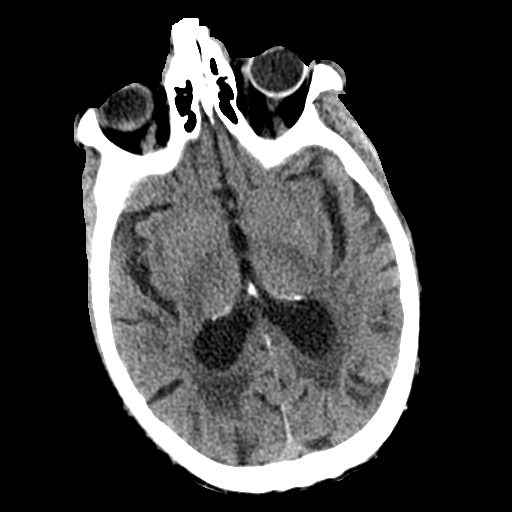
[im 21/34  brain]
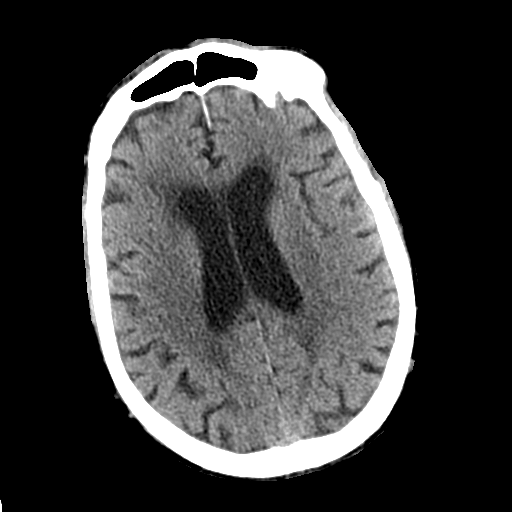
[im 21/34  bone]
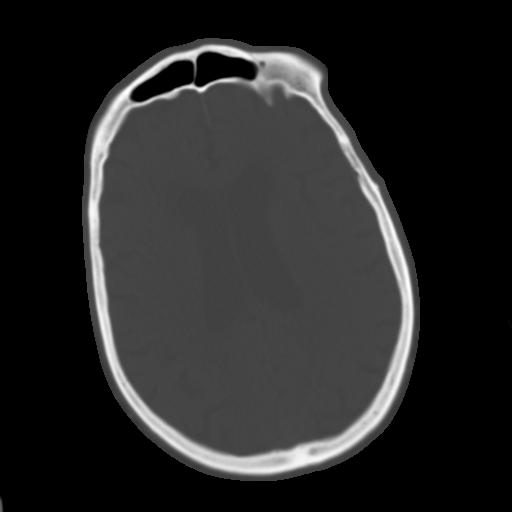
[im 25/34  brain]
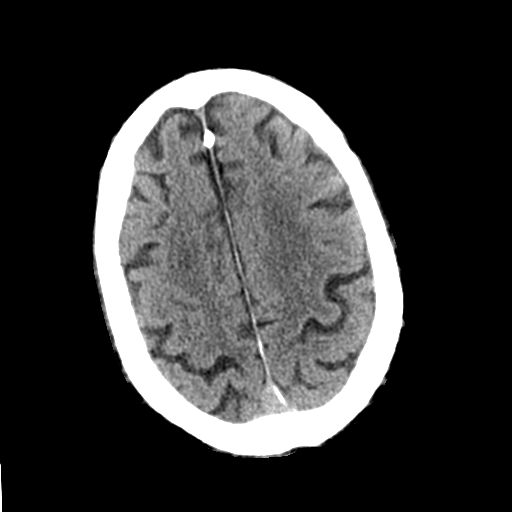
[im 29/34  brain]
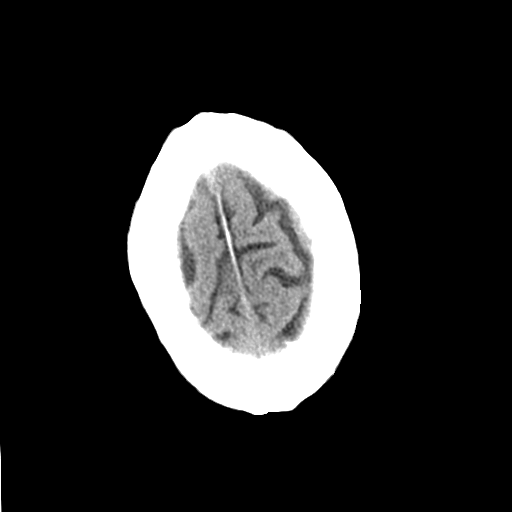

[Series 3: head bone · axial · 0.42mm/px · z∈[-15,+1]mm · 2 of 85 slices shown]
[im 9/85  bone]
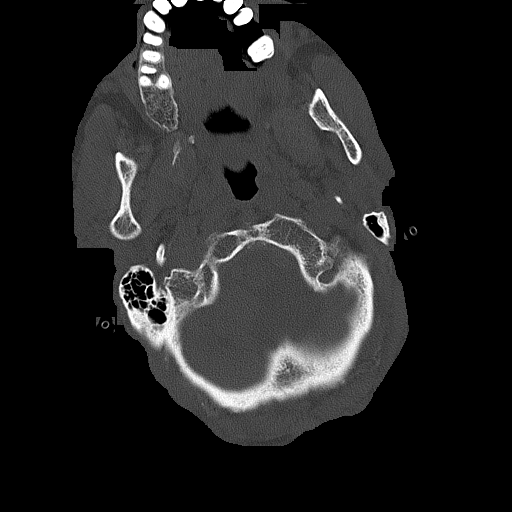
[im 17/85  bone]
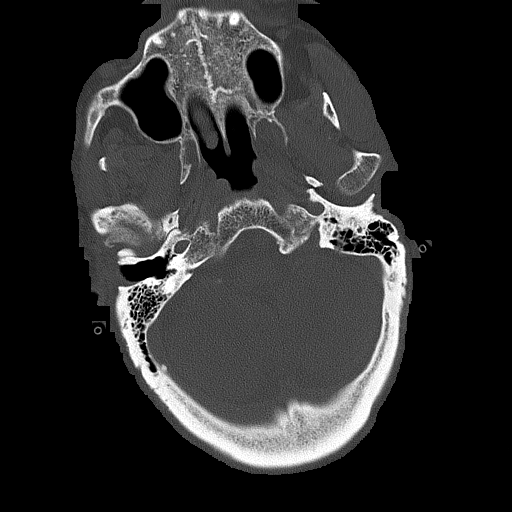

[Series 4: coronal soft · coronal · 0.34mm/px · 3 of 84 slices shown]
[im 28/84  brain]
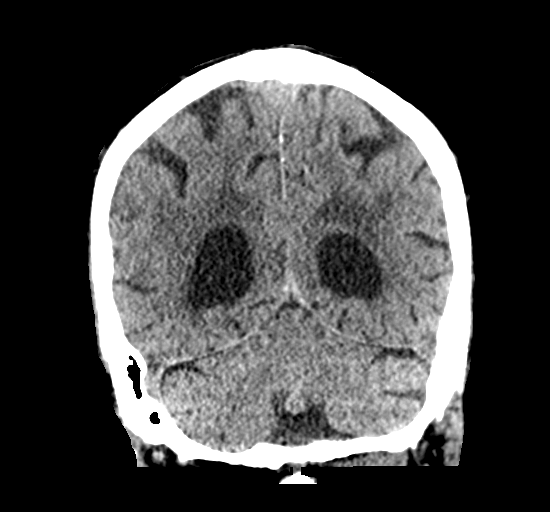
[im 37/84  brain]
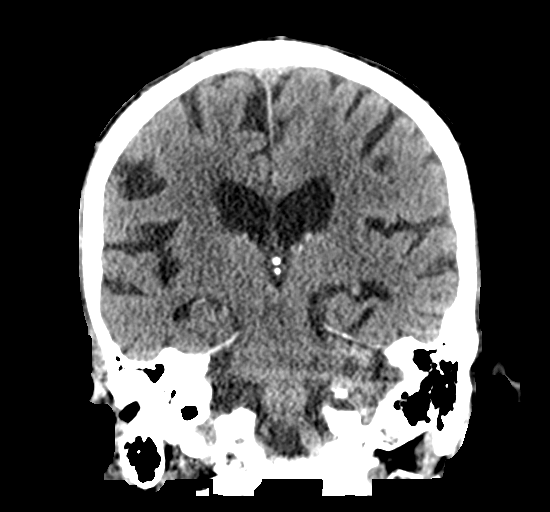
[im 47/84  brain]
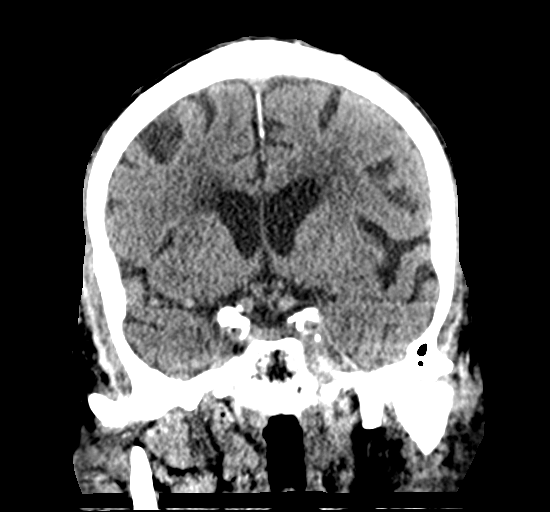

[Series 5: sagittal soft · sagittal · 0.37mm/px · 3 of 54 slices shown]
[im 18/54  brain]
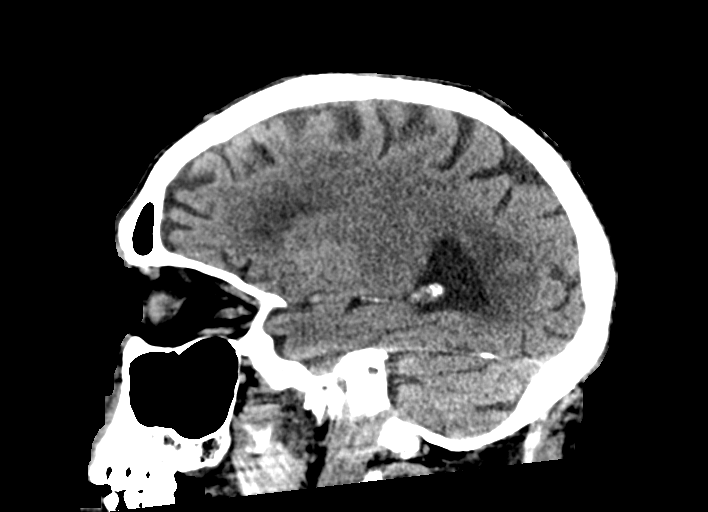
[im 27/54  brain]
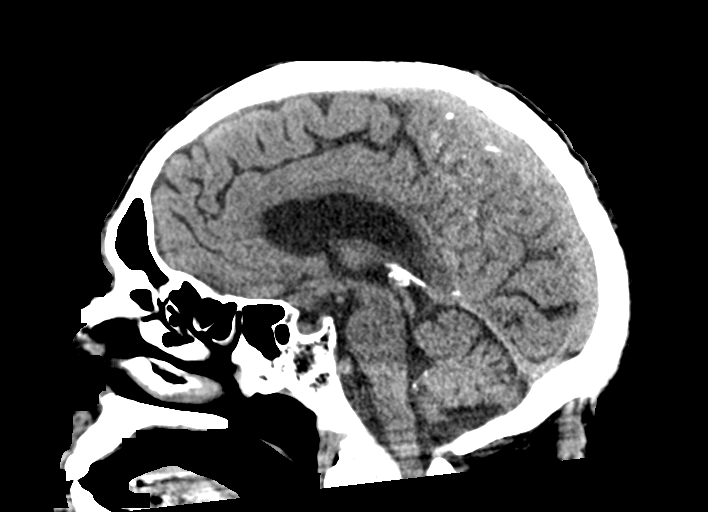
[im 36/54  brain]
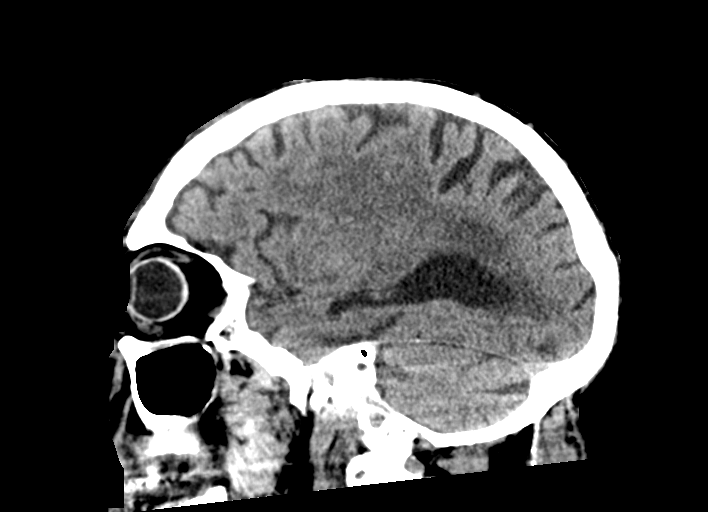

[15 of 47 positions shown; findings below may reference images not displayed]

FINDINGS: CT HEAD FINDINGS

Brain: There is no evidence of an acute infarct, intracranial
hemorrhage, mass, midline shift, or extra-axial fluid collection.
Cerebral atrophy is within normal limits for age. A subcentimeter
infarct inferiorly in the right cerebellar hemisphere is chronic in
appearance. Patchy hypodensities in the cerebral white matter
bilaterally are nonspecific but compatible with moderate chronic
small vessel ischemic disease.

Vascular: Calcified atherosclerosis at the skull base. No hyperdense
vessel.

Skull: No acute fracture or suspicious osseous lesion.

Sinuses/Orbits: Paranasal sinuses and mastoid air cells are clear.
Bilateral cataract extraction.

Other: None.

CT CERVICAL SPINE FINDINGS

There is moderate motion artifact through the upper cervical spine.

Alignment: Normal.

Skull base and vertebrae: No acute fracture or suspicious osseous
lesion is identified within limitations of motion artifact.

Soft tissues and spinal canal: No prevertebral fluid or swelling. No
visible canal hematoma.

Disc levels: Solid C3-C5 ACDF. Interbody ankylosis at C5-6 and C6-7
as well.

Upper chest: No apical lung consolidation or mass.

Other: Mild calcific atherosclerosis at the carotid bifurcations.
IMPRESSION: 1. No evidence of an acute intracranial abnormality.
2. Motion degraded cervical spine CT without an acute fracture
identified.

These results were called by telephone at the time of interpretation
on 12/17/2021 at [DATE] to provider SPORTXBET GODOBA , who verbally
acknowledged these results.
